# Patient Record
Sex: Female | Born: 1990 | Hispanic: No | Marital: Married | Smoking: Never smoker
Health system: Southern US, Community
[De-identification: ages and names within clinical notes are randomized; demographics above are authoritative.]

## PROBLEM LIST (undated history)

## (undated) DIAGNOSIS — J309 Allergic rhinitis, unspecified: Secondary | ICD-10-CM

## (undated) DIAGNOSIS — N809 Endometriosis, unspecified: Secondary | ICD-10-CM

## (undated) DIAGNOSIS — E041 Nontoxic single thyroid nodule: Secondary | ICD-10-CM

## (undated) DIAGNOSIS — G43109 Migraine with aura, not intractable, without status migrainosus: Secondary | ICD-10-CM

## (undated) DIAGNOSIS — Z8759 Personal history of other complications of pregnancy, childbirth and the puerperium: Secondary | ICD-10-CM

## (undated) DIAGNOSIS — O4400 Placenta previa specified as without hemorrhage, unspecified trimester: Secondary | ICD-10-CM

## (undated) DIAGNOSIS — T7840XA Allergy, unspecified, initial encounter: Secondary | ICD-10-CM

## (undated) DIAGNOSIS — J45909 Unspecified asthma, uncomplicated: Secondary | ICD-10-CM

## (undated) DIAGNOSIS — Z8709 Personal history of other diseases of the respiratory system: Secondary | ICD-10-CM

## (undated) HISTORY — PX: WISDOM TOOTH EXTRACTION: SHX21

## (undated) HISTORY — DX: Allergic rhinitis, unspecified: J30.9

## (undated) HISTORY — DX: Nontoxic single thyroid nodule: E04.1

## (undated) HISTORY — PX: FOOT SURGERY: SHX648

## (undated) HISTORY — DX: Migraine with aura, not intractable, without status migrainosus: G43.109

## (undated) HISTORY — DX: Personal history of other complications of pregnancy, childbirth and the puerperium: Z87.59

## (undated) HISTORY — DX: Allergy, unspecified, initial encounter: T78.40XA

## (undated) HISTORY — DX: Unspecified asthma, uncomplicated: J45.909

---

## 2013-03-28 HISTORY — PX: WISDOM TOOTH EXTRACTION: SHX21

## 2013-04-08 ENCOUNTER — Ambulatory Visit (INDEPENDENT_AMBULATORY_CARE_PROVIDER_SITE_OTHER): Payer: Federal, State, Local not specified - PPO | Admitting: Internal Medicine

## 2013-04-08 VITALS — BP 102/64 | HR 102 | Temp 98.7°F | Resp 16 | Ht 63.0 in | Wt 128.0 lb

## 2013-04-08 DIAGNOSIS — J039 Acute tonsillitis, unspecified: Secondary | ICD-10-CM

## 2013-04-08 LAB — POCT CBC
Granulocyte percent: 83.9 %G — AB (ref 37–80)
HCT, POC: 40.4 % (ref 37.7–47.9)
Hemoglobin: 12.9 g/dL (ref 12.2–16.2)
Lymph, poc: 2.1 (ref 0.6–3.4)
MCH, POC: 30.9 pg (ref 27–31.2)
MCHC: 31.9 g/dL (ref 31.8–35.4)
MCV: 96.7 fL (ref 80–97)
MID (cbc): 1 — AB (ref 0–0.9)
MPV: 7 fL (ref 0–99.8)
POC Granulocyte: 16.2 — AB (ref 2–6.9)
POC LYMPH PERCENT: 11 %L (ref 10–50)
POC MID %: 5.1 %M (ref 0–12)
Platelet Count, POC: 312 10*3/uL (ref 142–424)
RBC: 4.18 M/uL (ref 4.04–5.48)
RDW, POC: 12.5 %
WBC: 19.3 10*3/uL — AB (ref 4.6–10.2)

## 2013-04-08 LAB — POCT RAPID STREP A (OFFICE): Rapid Strep A Screen: NEGATIVE

## 2013-04-08 MED ORDER — HYDROCODONE-ACETAMINOPHEN 5-325 MG PO TABS: 1.0000 | ORAL_TABLET | Freq: Four times a day (QID) | ORAL | Status: DC | PRN

## 2013-04-08 MED ORDER — AMOXICILLIN 500 MG PO CAPS: 1000.0000 mg | ORAL_CAPSULE | Freq: Two times a day (BID) | ORAL | Status: AC

## 2013-04-08 NOTE — Progress Notes (Signed)
  Subjective:    Patient ID: Jacqueline [name], female    DOB: [birth year], 23 y.o.   MRN: [id number]  HPI pain and swelling in the right tonsil starting last night Kept her awake No nasal congestion or cough and No fever and in no recent illness other than wisdom tooth extraction x4  2 weeks ago /followed amoxicillin ending 4 days ago   Hostess bravo  Review of Systems     Objective:   Physical Exam TMs clear Nares clear Oropharynx with 3+ right tonsil that is hemorrhagic and exudative without obvious ulcers 3+ right ac node      Results for orders placed in visit on 04/08/13  POCT RAPID STREP A (OFFICE)      Result Value Range   Rapid Strep A Screen Negative  Negative  POCT CBC      Result Value Range   WBC 19.3 (*) 4.6 - 10.2 K/uL   Lymph, poc 2.1  0.6 - 3.4   POC LYMPH PERCENT 11.0  10 - 50 %L   MID (cbc) 1.0 (*) 0 - 0.9   POC MID % 5.1  0 - 12 %M   POC Granulocyte 16.2 (*) 2 - 6.9   Granulocyte percent 83.9 (*) 37 - 80 %G   RBC 4.18  4.04 - 5.48 M/uL   Hemoglobin 12.9  12.2 - 16.2 g/dL   HCT, POC 40.9  81.1 - 47.9 %   MCV 96.7  80 - 97 fL   MCH, POC 30.9  27 - 31.2 pg   MCHC 31.9  31.8 - 35.4 g/dL   RDW, POC 91.4     Platelet Count, POC 312  142 - 424 K/uL   MPV 7.0  0 - 99.8 fL    Assessment & Plan:  Acute right-sided tonsillitis  Meds ordered this encounter  Medications  . amoxicillin (AMOXIL) 500 MG capsule    Sig: Take 2 capsules (1,000 mg total) by mouth 2 (two) times daily.    Dispense:  40 capsule    Refill:  0  . HYDROcodone-acetaminophen (NORCO) 5-325 MG per tablet    Sig: Take 1 tablet by mouth every 6 (six) hours as needed for pain.    Dispense:  20 tablet    Refill:  0   Followup 48 hours-beach sunday Throat culture sent

## 2013-04-10 ENCOUNTER — Telehealth: Payer: Self-pay

## 2013-04-10 LAB — CULTURE, GROUP A STREP: Organism ID, Bacteria: NORMAL

## 2013-04-10 NOTE — Telephone Encounter (Signed)
Patient calling about lab results. [phone]

## 2013-04-10 NOTE — Telephone Encounter (Signed)
Culture negative. Pt advised

## 2013-04-16 ENCOUNTER — Telehealth: Payer: Self-pay

## 2013-04-16 NOTE — Telephone Encounter (Signed)
PT STATES WE HAD CALLED REGARDING HER LABS. PLEASE CALL [phone]

## 2013-04-16 NOTE — Telephone Encounter (Signed)
Called pt, message in lab pool.

## 2016-02-15 DIAGNOSIS — D2372 Other benign neoplasm of skin of left lower limb, including hip: Secondary | ICD-10-CM | POA: Diagnosis not present

## 2016-02-15 DIAGNOSIS — D2371 Other benign neoplasm of skin of right lower limb, including hip: Secondary | ICD-10-CM | POA: Diagnosis not present

## 2016-02-15 DIAGNOSIS — G5762 Lesion of plantar nerve, left lower limb: Secondary | ICD-10-CM | POA: Diagnosis not present

## 2016-04-26 ENCOUNTER — Ambulatory Visit (INDEPENDENT_AMBULATORY_CARE_PROVIDER_SITE_OTHER): Payer: BLUE CROSS/BLUE SHIELD | Admitting: Certified Nurse Midwife

## 2016-04-26 ENCOUNTER — Encounter: Payer: Self-pay | Admitting: Certified Nurse Midwife

## 2016-04-26 VITALS — BP 90/60 | HR 68 | Resp 16 | Ht 62.25 in | Wt 123.0 lb

## 2016-04-26 DIAGNOSIS — Z0000 Encounter for general adult medical examination without abnormal findings: Secondary | ICD-10-CM

## 2016-04-26 DIAGNOSIS — E049 Nontoxic goiter, unspecified: Secondary | ICD-10-CM

## 2016-04-26 DIAGNOSIS — Z01419 Encounter for gynecological examination (general) (routine) without abnormal findings: Secondary | ICD-10-CM

## 2016-04-26 DIAGNOSIS — Z124 Encounter for screening for malignant neoplasm of cervix: Principal | ICD-10-CM

## 2016-04-26 LAB — THYROID PANEL WITH TSH
Free Thyroxine Index: 2.6 (ref 1.4–3.8)
T3 Uptake: 30 % (ref 22–35)
T4, Total: 8.7 ug/dL (ref 4.5–12.0)
TSH: 1.39 mIU/L

## 2016-04-26 LAB — CBC
HCT: 41.7 % (ref 35.0–45.0)
Hemoglobin: 14.1 g/dL (ref 11.7–15.5)
MCH: 31.1 pg (ref 27.0–33.0)
MCHC: 33.8 g/dL (ref 32.0–36.0)
MCV: 91.9 fL (ref 80.0–100.0)
MPV: 8.2 fL (ref 7.5–12.5)
Platelets: 292 10*3/uL (ref 140–400)
RBC: 4.54 MIL/uL (ref 3.80–5.10)
RDW: 13 % (ref 11.0–15.0)
WBC: 10.5 10*3/uL (ref 3.8–10.8)

## 2016-04-26 NOTE — Progress Notes (Signed)
Reviewed personally.  M. Suzanne Jourdyn Hasler, MD.  

## 2016-04-26 NOTE — Patient Instructions (Signed)
General topics  Next pap or exam is  due in 1 year Take a Women's multivitamin Take 1200 mg. of calcium daily - prefer dietary If any concerns in interim to call back  Breast Self-Awareness Practicing breast self-awareness may pick up problems early, prevent significant medical complications, and possibly save your life. By practicing breast self-awareness, you can become familiar with how your breasts look and feel and if your breasts are changing. This allows you to notice changes early. It can also offer you some reassurance that your breast health is good. One way to learn what is normal for your breasts and whether your breasts are changing is to do a breast self-exam. If you find a lump or something that was not present in the past, it is best to contact your caregiver right away. Other findings that should be evaluated by your caregiver include nipple discharge, especially if it is bloody; skin changes or reddening; areas where the skin seems to be pulled in (retracted); or new lumps and bumps. Breast pain is seldom associated with cancer (malignancy), but should also be evaluated by a caregiver. BREAST SELF-EXAM The best time to examine your breasts is 5 7 days after your menstrual period is over.  ExitCare Patient Information 2013 [employer].   Exercise to Stay Healthy Exercise helps you become and stay healthy. EXERCISE IDEAS AND TIPS Choose exercises that:  You enjoy.  Fit into your day. You do not need to exercise really hard to be healthy. You can do exercises at a slow or medium level and stay healthy. You can:  Stretch before and after working out.  Try yoga, Pilates, or tai chi.  Lift weights.  Walk fast, swim, jog, run, climb stairs, bicycle, dance, or rollerskate.  Take aerobic classes. Exercises that burn about 150 calories:  Running 1  miles in 15 minutes.  Playing volleyball for 45 to 60 minutes.  Washing and waxing a car for 45 to 60  minutes.  Playing touch football for 45 minutes.  Walking 1  miles in 35 minutes.  Pushing a stroller 1  miles in 30 minutes.  Playing basketball for 30 minutes.  Raking leaves for 30 minutes.  Bicycling 5 miles in 30 minutes.  Walking 2 miles in 30 minutes.  Dancing for 30 minutes.  Shoveling snow for 15 minutes.  Swimming laps for 20 minutes.  Walking up stairs for 15 minutes.  Bicycling 4 miles in 15 minutes.  Gardening for 30 to 45 minutes.  Jumping rope for 15 minutes.  Washing windows or floors for 45 to 60 minutes. Document Released: 11/25/2010 Document Revised: 01/15/2012 Document Reviewed: 11/25/2010 ExitCare Patient Information 2013 [employer].   Other topics ( that may be useful information):    Sexually Transmitted Disease Sexually transmitted disease (STD) refers to any infection that is passed from person to person during sexual activity. This may happen by way of saliva, semen, blood, vaginal mucus, or urine. Common STDs include:  Gonorrhea.  Chlamydia.  Syphilis.  HIV/AIDS.  Genital herpes.  Hepatitis B and C.  Trichomonas.  Human papillomavirus (HPV).  Pubic lice. CAUSES  An STD may be spread by bacteria, virus, or parasite. A person can get an STD by:  Sexual intercourse with an infected person.  Sharing sex toys with an infected person.  Sharing needles with an infected person.  Having intimate contact with the genitals, mouth, or rectal areas of an infected person. SYMPTOMS  Some people may not have any symptoms, but   they can still pass the infection to others. Different STDs have different symptoms. Symptoms include:  Painful or bloody urination.  Pain in the pelvis, abdomen, vagina, anus, throat, or eyes.  Skin rash, itching, irritation, growths, or sores (lesions). These usually occur in the genital or anal area.  Abnormal vaginal discharge.  Penile discharge in men.  Soft, flesh-colored skin growths in the  genital or anal area.  Fever.  Pain or bleeding during sexual intercourse.  Swollen glands in the groin area.  Yellow skin and eyes (jaundice). This is seen with hepatitis. DIAGNOSIS  To make a diagnosis, your caregiver may:  Take a medical history.  Perform a physical exam.  Take a specimen (culture) to be examined.  Examine a sample of discharge under a microscope.  Perform blood test TREATMENT   Chlamydia, gonorrhea, trichomonas, and syphilis can be cured with antibiotic medicine.  Genital herpes, hepatitis, and HIV can be treated, but not cured, with prescribed medicines. The medicines will lessen the symptoms.  Genital warts from HPV can be treated with medicine or by freezing, burning (electrocautery), or surgery. Warts may come back.  HPV is a virus and cannot be cured with medicine or surgery.However, abnormal areas may be followed very closely by your caregiver and may be removed from the cervix, vagina, or vulva through office procedures or surgery. If your diagnosis is confirmed, your recent sexual partners need treatment. This is true even if they are symptom-free or have a negative culture or evaluation. They should not have sex until their caregiver says it is okay. HOME CARE INSTRUCTIONS  All sexual partners should be informed, tested, and treated for all STDs.  Take your antibiotics as directed. Finish them even if you start to feel better.  Only take over-the-counter or prescription medicines for pain, discomfort, or fever as directed by your caregiver.  Rest.  Eat a balanced diet and drink enough fluids to keep your urine clear or pale yellow.  Do not have sex until treatment is completed and you have followed up with your caregiver. STDs should be checked after treatment.  Keep all follow-up appointments, Pap tests, and blood tests as directed by your caregiver.  Only use latex condoms and water-soluble lubricants during sexual activity. Do not use  petroleum jelly or oils.  Avoid alcohol and illegal drugs.  Get vaccinated for HPV and hepatitis. If you have not received these vaccines in the past, talk to your caregiver about whether one or both might be right for you.  Avoid risky sex practices that can break the skin. The only way to avoid getting an STD is to avoid all sexual activity.Latex condoms and dental dams (for oral sex) will help lessen the risk of getting an STD, but will not completely eliminate the risk. SEEK MEDICAL CARE IF:   You have a fever.  You have any new or worsening symptoms. Document Released: 01/13/2003 Document Revised: 01/15/2012 Document Reviewed: 01/20/2011 [facility] Patient Information 2013 [employer].    Domestic Abuse You are being battered or abused if someone close to you hits, pushes, or physically hurts you in any way. You also are being abused if you are forced into activities. You are being sexually abused if you are forced to have sexual contact of any kind. You are being emotionally abused if you are made to feel worthless or if you are constantly threatened. It is important to remember that help is available. No one has the right to abuse you. PREVENTION OF FURTHER  ABUSE  Learn the warning signs of danger. This varies with situations but may include: the use of alcohol, threats, isolation from friends and family, or forced sexual contact. Leave if you feel that violence is going to occur.  If you are attacked or beaten, report it to the police so the abuse is documented. You do not have to press charges. The police can protect you while you or the attackers are leaving. Get the officer's name and badge number and a copy of the report.  Find someone you can trust and tell them what is happening to you: your caregiver, a nurse, clergy member, close friend or family member. Feeling ashamed is natural, but remember that you have done nothing wrong. No one deserves abuse. Document Released:  10/20/2000 Document Revised: 01/15/2012 Document Reviewed: 12/29/2010 ExitCare Patient Information 2013 [employer].    How Much is Too Much Alcohol? Drinking too much alcohol can cause injury, accidents, and health problems. These types of problems can include:   Car crashes.  Falls.  Family fighting (domestic violence).  Drowning.  Fights.  Injuries.  Burns.  Damage to certain organs.  Having a baby with birth defects. ONE DRINK CAN BE TOO MUCH WHEN YOU ARE:  Working.  Pregnant or breastfeeding.  Taking medicines. Ask your doctor.  Driving or planning to drive. If you or someone you know has a drinking problem, get help from a doctor.  Document Released: 08/19/2009 Document Revised: 01/15/2012 Document Reviewed: 08/19/2009 ExitCare Patient Information 2013 [employer].   Smoking Hazards Smoking cigarettes is extremely bad for your health. Tobacco smoke has over 200 known poisons in it. There are over 60 chemicals in tobacco smoke that cause cancer. Some of the chemicals found in cigarette smoke include:   Cyanide.  Benzene.  Formaldehyde.  Methanol (wood alcohol).  Acetylene (fuel used in welding torches).  Ammonia. Cigarette smoke also contains the poisonous gases nitrogen oxide and carbon monoxide.  Cigarette smokers have an increased risk of many serious medical problems and Smoking causes approximately:  90% of all lung cancer deaths in men.  80% of all lung cancer deaths in women.  90% of deaths from chronic obstructive lung disease. Compared with nonsmokers, smoking increases the risk of:  Coronary heart disease by 2 to 4 times.  Stroke by 2 to 4 times.  Men developing lung cancer by 23 times.  Women developing lung cancer by 13 times.  Dying from chronic obstructive lung diseases by 12 times.  . Smoking is the most preventable cause of death and disease in our society.  WHY IS SMOKING ADDICTIVE?  Nicotine is the chemical  agent in tobacco that is capable of causing addiction or dependence.  When you smoke and inhale, nicotine is absorbed rapidly into the bloodstream through your lungs. Nicotine absorbed through the lungs is capable of creating a powerful addiction. Both inhaled and non-inhaled nicotine may be addictive.  Addiction studies of cigarettes and spit tobacco show that addiction to nicotine occurs mainly during the teen years, when young people begin using tobacco products. WHAT ARE THE BENEFITS OF QUITTING?  There are many health benefits to quitting smoking.   Likelihood of developing cancer and heart disease decreases. Health improvements are seen almost immediately.  Blood pressure, pulse rate, and breathing patterns start returning to normal soon after quitting. QUITTING SMOKING   American Lung Association - 1-800-LUNGUSA  American Cancer Society - 1-800-ACS-2345 Document Released: 11/30/2004 Document Revised: 01/15/2012 Document Reviewed: 08/04/2009 ExitCare Patient Information 2013 ExitCare,   LLC.   Stress Management Stress is a state of physical or mental tension that often results from changes in your life or normal routine. Some common causes of stress are:  Death of a loved one.  Injuries or severe illnesses.  Getting fired or changing jobs.  Moving into a new home. Other causes may be:  Sexual problems.  Business or financial losses.  Taking on a large debt.  Regular conflict with someone at home or at work.  Constant tiredness from lack of sleep. It is not just bad things that are stressful. It may be stressful to:  Win the lottery.  Get married.  Buy a new car. The amount of stress that can be easily tolerated varies from person to person. Changes generally cause stress, regardless of the types of change. Too much stress can affect your health. It may lead to physical or emotional problems. Too little stress (boredom) may also become stressful. SUGGESTIONS TO  REDUCE STRESS:  Talk things over with your family and friends. It often is helpful to share your concerns and worries. If you feel your problem is serious, you may want to get help from a professional counselor.  Consider your problems one at a time instead of lumping them all together. Trying to take care of everything at once may seem impossible. List all the things you need to do and then start with the most important one. Set a goal to accomplish 2 or 3 things each day. If you expect to do too many in a single day you will naturally fail, causing you to feel even more stressed.  Do not use alcohol or drugs to relieve stress. Although you may feel better for a short time, they do not remove the problems that caused the stress. They can also be habit forming.  Exercise regularly - at least 3 times per week. Physical exercise can help to relieve that "uptight" feeling and will relax you.  The shortest distance between despair and hope is often a good night's sleep.  Go to bed and get up on time allowing yourself time for appointments without being rushed.  Take a short "time-out" period from any stressful situation that occurs during the day. Close your eyes and take some deep breaths. Starting with the muscles in your face, tense them, hold it for a few seconds, then relax. Repeat this with the muscles in your neck, shoulders, hand, stomach, back and legs.  Take good care of yourself. Eat a balanced diet and get plenty of rest.  Schedule time for having fun. Take a break from your daily routine to relax. HOME CARE INSTRUCTIONS   Call if you feel overwhelmed by your problems and feel you can no longer manage them on your own.  Return immediately if you feel like hurting yourself or someone else. Document Released: 04/18/2001 Document Revised: 01/15/2012 Document Reviewed: 12/09/2007 ExitCare Patient Information 2013 ExitCare, [employer].   

## 2016-04-26 NOTE — Progress Notes (Signed)
25 y.o. G0P0000 Married  Caucasian Fe here to establish gyn care and  for annual exam.Contraception previously used Nuvaring and stopped about 1 1/2 years ago. Has not been trying for pregnancy, but would be welcome. Desires screening needed for pregnancy if needed. Periods 35 day cycle and duration 4 days, day one heavy, occasional cramping day one only and some PMS. Has noted spotting midcycle and can having spotting up to 2 days to two weeks with period.Has done home UPT which has been negative. Negative UPT today.  Sees PCP prn. No other health issues today. Planning pregnancy in next year.  Patient's last menstrual period was 04/06/2016.          Sexually active: Yes.    The current method of family planning is none.    Exercising: Yes.    occ weight lifting Smoker:  no  Health Maintenance: Pap:  2016 neg, no abnormal pap smears in past MMG:  none Colonoscopy:  none BMD:   none TDaP: pt to check with previous gyn Shingles: no Pneumonia: no Hep C and HIV: not done Labs: none Self breast exam: not done   reports that she has never smoked. She does not have any smokeless tobacco history on file. She reports that she drinks about 0.6 - 1.8 oz of alcohol per week. She reports that she does not use illicit drugs.  History reviewed. No pertinent past medical history.  History reviewed. No pertinent past surgical history.  Current Outpatient Prescriptions  Medication Sig Dispense Refill  . loratadine (CLARITIN) 10 MG tablet Take 10 mg by mouth as needed for allergies.    . Multiple Vitamins-Minerals (MULTIVITAMIN PO) Take by mouth daily.    . Pyridoxine HCl (B-6 PO) Take by mouth daily.     No current facility-administered medications for this visit.    History reviewed. No pertinent family history.  ROS:  Pertinent items are noted in HPI.  Otherwise, a comprehensive ROS was negative.  Exam:   BP 90/60 mmHg  Pulse 68  Resp 16  Ht 5' 2.25" (1.581 m)  Wt 123 lb (55.792 kg)   BMI 22.32 kg/m2  LMP 04/06/2016 Height: 5' 2.25" (158.1 cm) Ht Readings from Last 3 Encounters:  04/26/16 5' 2.25" (1.581 m)    General appearance: alert, cooperative and appears stated age Head: Normocephalic, without obvious abnormality, atraumatic Neck: no adenopathy, supple, symmetrical, trachea midline and thyroid enlarged Lungs: clear to auscultation bilaterally Breasts: normal appearance, no masses or tenderness, No nipple retraction or dimpling, No nipple discharge or bleeding, No axillary or supraclavicular adenopathy Heart: regular rate and rhythm Abdomen: soft, non-tender; no masses,  no organomegaly Extremities: extremities normal, atraumatic, no cyanosis or edema Skin: Skin color, texture, turgor normal. No rashes or lesions Lymph nodes: Cervical, supraclavicular, and axillary nodes normal. No abnormal inguinal nodes palpated Neurologic: Grossly normal   Pelvic: External genitalia:  no lesions              Urethra:  normal appearing urethra with no masses, tenderness or lesions              Bartholin's and Skene's: normal                 Vagina: normal appearing vagina with normal color and discharge, no lesions              Cervix: no cervical motion tenderness, no lesions and nulliparous appearance  Pap taken: Yes.   Bimanual Exam:  Uterus:  normal size, contour, position, consistency, mobility, non-tender and anteverted              Adnexa: normal adnexa and no mass, fullness, tenderness               Rectovaginal: Confirms               Anus:  Normal appearance  Chaperone present: yes  A:  Well Woman with normal exam  Contraception NFP  DUB at times with normal cycle  Enlarged thyroid  Screening labs  May plan pregnancy in next year  P:   Reviewed health and wellness pertinent to exam  Discussed starting on OTC prenatal vitamins which increases folic acid content which is helpful with pregnancy. Given information on what to buy OTC. Given planning  for pregnancy information and discussed Rubella screen and would like to have done. Patient will continue to keep menses calendar with spotting.   Discussed enlarged thyroid and need to evaluate with lab and [place]. Also discussed can affect menses cycle. Agreeable to labs and plan. Patient will be called with appointment for [place] of thyroid. Questions addressed regarding enlargement etiology.  Labs: Rubella, CBC, Thyroid panel with TSH  Pap smear as above with HPV reflex   counseled on breast self exam, adequate intake of calcium and vitamin D, diet and exercise  return annually or prn  An After Visit Summary was printed and given to the patient.

## 2016-04-27 ENCOUNTER — Telehealth: Payer: Self-pay

## 2016-04-27 LAB — RUBELLA SCREEN: Rubella: 2 Index — ABNORMAL HIGH (ref <0.90)

## 2016-04-27 LAB — IPS PAP TEST WITH REFLEX TO HPV

## 2016-04-27 NOTE — Telephone Encounter (Signed)
lmtcb

## 2016-04-27 NOTE — Telephone Encounter (Signed)
Patient notified of results. See lab 

## 2016-04-27 NOTE — Telephone Encounter (Signed)
-----   Message from Regina Eck, CNM sent at 04/27/2016  1:08 PM EDT ----- Notify patient that Rubella screen shows immunity, so no concerns CBC is normal Thyroid panel is normal, but would like for patient to thyroid US due to enlargement. Order placed. She will be called with appointment.

## 2016-05-02 ENCOUNTER — Ambulatory Visit
Admission: RE | Admit: 2016-05-02 | Discharge: 2016-05-02 | Disposition: A | Discharge status: Home / Self Care | Payer: BLUE CROSS/BLUE SHIELD | Source: Ambulatory Visit | Attending: Certified Nurse Midwife | Admitting: Certified Nurse Midwife

## 2016-05-02 DIAGNOSIS — E049 Nontoxic goiter, unspecified: Principal | ICD-10-CM

## 2016-05-04 ENCOUNTER — Telehealth: Payer: Self-pay | Admitting: Certified Nurse Midwife

## 2016-05-04 DIAGNOSIS — E049 Nontoxic goiter, unspecified: Principal | ICD-10-CM

## 2016-05-04 NOTE — Telephone Encounter (Signed)
Agree with plan 

## 2016-05-04 NOTE — Telephone Encounter (Signed)
Patient calling for results on thyroid scan.   Routing to triage.

## 2016-05-04 NOTE — Telephone Encounter (Signed)
Spoke with patient. Advised of message as seen below from Flora. Patient is agreeable and verbalizes understanding. Declines to schedule 6 month follow up ultrasound at this time. She would like to wait until this gets closer. Will place in imaging hold. Asking if she needs any additional testing regarding her irregular spotting. "I know the reason she wanted to check my thyroid was due to the spotting. I didn't know if I need to do anything else now." Advised I will speak with Melvia Heaps CNM and return call with further recommendations.  Notes Recorded by Regina Eck, CNM on 05/03/2016 at 12:40 PM Notify patient that US showed slight enlargement with a slight subtle hypoechoic area on posterior aspect of right thyroid on right lobe. Left lobe normal with no nodule. Recommend repeat US in 6 months. Please schedule.  Cc: Karen Chafe, RN for imaging hold

## 2016-05-05 NOTE — Addendum Note (Signed)
Addended by: Michele Mcalpine on: 05/05/2016 01:56 PM   Modules accepted: Orders

## 2016-05-12 ENCOUNTER — Telehealth: Payer: Self-pay | Admitting: Emergency Medicine

## 2016-05-12 DIAGNOSIS — R9389 Abnormal findings on diagnostic imaging of other specified body structures: Principal | ICD-10-CM

## 2016-05-12 NOTE — Telephone Encounter (Signed)
Spoke with patient and advised that referral recommended by Dr. Sabra Heck. She is agreeable and advised that referral placed to Dr. Loanne Drilling at [facility].   Advised she will be contacted regarding appointment.  cc Kerry Hough for processing and patient contact.  Routing to provider for final review. Patient agreeable to disposition. Will close encounter.

## 2016-05-12 NOTE — Telephone Encounter (Signed)
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  CC:  Debbi as FYI.  Thanks.  Vinnie Level ----- Message -----    From: Michele Mcalpine, RN    Sent: 05/05/2016   1:38 PM      To: Megan Salon, MD Subject: Thyroid imaging                                Dr. Sabra Heck, this is a [facility] patient who went for ultrasound of thyroid.  They want to rescan in 6 months. Do I need to enter in a recall or just continue  hold?  Is endocrine referral necessary at this time?

## 2016-05-24 DIAGNOSIS — D2372 Other benign neoplasm of skin of left lower limb, including hip: Secondary | ICD-10-CM | POA: Diagnosis not present

## 2016-05-24 DIAGNOSIS — M21622 Bunionette of left foot: Secondary | ICD-10-CM | POA: Diagnosis not present

## 2016-05-24 DIAGNOSIS — G5762 Lesion of plantar nerve, left lower limb: Secondary | ICD-10-CM | POA: Diagnosis not present

## 2016-05-26 ENCOUNTER — Ambulatory Visit (INDEPENDENT_AMBULATORY_CARE_PROVIDER_SITE_OTHER): Payer: BLUE CROSS/BLUE SHIELD | Admitting: Endocrinology

## 2016-05-26 ENCOUNTER — Encounter: Payer: Self-pay | Admitting: Endocrinology

## 2016-05-26 VITALS — BP 96/68 | HR 68 | Temp 98.9°F | Ht 62.5 in | Wt 123.5 lb

## 2016-05-26 DIAGNOSIS — J309 Allergic rhinitis, unspecified: Secondary | ICD-10-CM

## 2016-05-26 DIAGNOSIS — R221 Localized swelling, mass and lump, neck: Principal | ICD-10-CM

## 2016-05-26 DIAGNOSIS — E041 Nontoxic single thyroid nodule: Secondary | ICD-10-CM

## 2016-05-26 NOTE — Patient Instructions (Signed)
Please recheck the ultrasound in 6-12 months.   You can call us when you are ready to do, or if not, we'll call you in 1 year.   most of the time, a "lumpy thyroid" will eventually become overactive (underactive is less likely, but also possible).  this is usually a slow process, happening over the span of many years.

## 2016-05-26 NOTE — Progress Notes (Signed)
Subjective:    Patient ID: Jacqueline [name], female    DOB: Oct 12, 1991, 25 y.o.   MRN: [id number]  HPI 1 month ago, pt was noted to have a nodule at the right side of the thyroid.  No assoc pain.  she is unaware of ever having had thyroid problems in the past.  she has no h/o XRT or surgery to the neck.   Past Medical History:  Diagnosis Date  . Allergic rhinitis   . Thyroid nodule     No past surgical history on file.  Social History   Social History  . Marital status: Married    Spouse name: N/A  . Number of children: N/A  . Years of education: N/A   Occupational History  . Not on file.   Social History Main Topics  . Smoking status: Never Smoker  . Smokeless tobacco: Never Used  . Alcohol use 0.6 - 1.8 oz/week    1 - 3 Standard drinks or equivalent per week     Comment: occasional  . Drug use: No  . Sexual activity: Yes    Partners: Male    Birth control/ protection: None   Other Topics Concern  . Not on file   Social History Narrative  . No narrative on file    Current Outpatient Prescriptions on File Prior to Visit  Medication Sig Dispense Refill  . loratadine (CLARITIN) 10 MG tablet Take 10 mg by mouth as needed for allergies.    . Multiple Vitamins-Minerals (MULTIVITAMIN PO) Take by mouth daily.     No current facility-administered medications on file prior to visit.     Allergies  Allergen Reactions  . Amoxicillin-Pot Clavulanate Other (See Comments)    Does not remember    Family History  Problem Relation Age of Onset  . Breast cancer Mother 43     mastectomy  . Thyroid disease Neg Hx     BP 96/68   Pulse 68   Temp 98.9 F (37.2 C) (Oral)   Ht 5' 2.5" (1.588 m)   Wt 123 lb 8 oz (56 kg)   LMP 05/16/2016   SpO2 98%   BMI 22.23 kg/m    Review of Systems Denies weight change, hoarseness, visual loss, chest pain, sob, dysphagia, skin rash, easy bruising, depression, cold intolerance, headache, numbness, and rhinorrhea.       Objective:     Physical Exam VS: see vs page GEN: no distress HEAD: head: no deformity eyes: no periorbital swelling, no proptosis external nose and ears are normal mouth: no lesion seen NECK: slight fullness at the right thyroid area CHEST WALL: no deformity LUNGS: clear to auscultation CV: reg rate and rhythm, no murmur ABD: abdomen is soft, nontender.  no hepatosplenomegaly.  not distended.  no hernia MUSCULOSKELETAL: muscle bulk and strength are grossly normal.  no obvious joint swelling.  gait is normal and steady EXTEMITIES: no deformity.  no ulcer on the feet.  feet are of normal color and temp.  no edema PULSES: dorsalis pedis intact bilat.  no carotid bruit NEURO:  cn 2-12 grossly intact.   readily moves all 4's.  sensation is intact to touch on the feet SKIN:  Normal texture and temperature.  No rash or suspicious lesion is visible.   NODES:  None palpable at the neck PSYCH: alert, well-oriented.  Does not appear anxious nor depressed.   Lab Results  Component Value Date   TSH 1.39 04/26/2016   T4TOTAL 8.7 04/26/2016  [place]: Indeterminate 1.5 cm hypoechoic area along the posterior right thyroid lobe  I have reviewed outside records, and summarized: Pt was recently seen for well woman exam, and was incidentally noted to have thyromegaly.  She is at risk for pregnancy.  She was referred here.     Assessment & Plan:  Thyroid nodule, new, uncertain etiology.  Too small to need bx.  Euthyroid  Patient is advised the following: Patient Instructions  Please recheck the ultrasound in 6-12 months.   You can call us when you are ready to do, or if not, we'll call you in 1 year.   most of the time, a "lumpy thyroid" will eventually become overactive (underactive is less likely, but also possible).  this is usually a slow process, happening over the span of many years.   Renato Shin, MD

## 2016-05-26 NOTE — Progress Notes (Signed)
Pre visit review using our clinic review tool, if applicable. No additional management support is needed unless otherwise documented below in the visit note. 

## 2016-05-28 ENCOUNTER — Encounter: Payer: Self-pay | Admitting: Endocrinology

## 2016-05-28 DIAGNOSIS — J309 Allergic rhinitis, unspecified: Secondary | ICD-10-CM

## 2016-05-29 ENCOUNTER — Other Ambulatory Visit: Payer: Self-pay | Admitting: Endocrinology

## 2016-05-29 DIAGNOSIS — E042 Nontoxic multinodular goiter: Principal | ICD-10-CM

## 2016-05-29 HISTORY — DX: Nontoxic multinodular goiter: E04.2

## 2016-05-29 LAB — PTH, INTACT AND CALCIUM
Calcium: 9.2 mg/dL (ref 8.4–10.5)
PTH: 33 pg/mL (ref 14–64)

## 2016-06-02 ENCOUNTER — Telehealth: Payer: Self-pay | Admitting: Certified Nurse Midwife

## 2016-06-09 ENCOUNTER — Encounter: Payer: Self-pay | Admitting: Certified Nurse Midwife

## 2016-06-09 ENCOUNTER — Ambulatory Visit (INDEPENDENT_AMBULATORY_CARE_PROVIDER_SITE_OTHER): Payer: BLUE CROSS/BLUE SHIELD | Admitting: Certified Nurse Midwife

## 2016-06-09 VITALS — BP 102/64 | HR 72 | Temp 98.2°F | Resp 16 | Ht 62.5 in | Wt 124.0 lb

## 2016-06-09 DIAGNOSIS — R109 Unspecified abdominal pain: Secondary | ICD-10-CM

## 2016-06-09 DIAGNOSIS — B373 Candidiasis of vulva and vagina: Secondary | ICD-10-CM

## 2016-06-09 DIAGNOSIS — K5900 Constipation, unspecified: Principal | ICD-10-CM

## 2016-06-09 DIAGNOSIS — B3731 Acute candidiasis of vulva and vagina: Secondary | ICD-10-CM

## 2016-06-09 LAB — POCT URINALYSIS DIPSTICK
Bilirubin, UA: NEGATIVE
Glucose, UA: NEGATIVE
Ketones, UA: NEGATIVE
Leukocytes, UA: NEGATIVE
Nitrite, UA: NEGATIVE
Protein, UA: NEGATIVE
Urobilinogen, UA: NEGATIVE
pH, UA: 5

## 2016-06-09 LAB — POCT URINE PREGNANCY: Preg Test, Ur: NEGATIVE

## 2016-06-09 NOTE — Patient Instructions (Signed)

## 2016-06-09 NOTE — Progress Notes (Signed)
25 y.o. Married Caucasian female G0P0000 here complaining of spotting with brown discharge and some red slight tiny clots noted. Last period was July 11 and was normal. Has 35 to 40 day cycles usually, but has had shorter cycles occasionally. This would be 21 days from onset 4 days ago. Denies any cramping now or heavy bleeding. Some bloating, but has constipation and took Dulcolax late pm and Gas X several times today. No nausea, vomiting or diarrhea prior to onset or now. Just wanted to make sure no vaginal problem.No urinary frequency or urgency or pain.No back pain or headache.. No vaginal itching or increase in vaginal discharge.  . Contraception is none, previous Nuvaring use, not trying for pregnancy at this point. No other concerns.    O:Healthy female WDWN Affect: normal, orientation x 3 No distress noted  Exam:Skin: warm and dry CVAT negative bilateral Abdomen: soft, very slight tenderness mid sternal, no rebound tenderness. Normal bowel sounds all four quadrants, gas sounds noted with and without stethescope,negative suprapubic  Inguinal Lymph nodes: no enlargement or tenderness  Pelvic exam: External genital: normal female, no lesions or redness BUS: normal, non tender Bladder non tender, Urethral meatus not red or tender Vagina: very scant pink discharge noted. Ph:4.0  ,Wet prep taken Cervix: normal, non tender, no CMT, scant blood noted from cervix Uterus: normal, non tender, no masses Adnexa:normal, non tender, no masses or fullness noted  POCT Urine: RBC TR,  POCT UPT: negative Wet Prep results:KOH,Saline  1-2 yeast buds noted only    A:Normal pelvic exam ? Menses due to history of irregular cycles Constipation with medication taken Wet prep + yeast, but not symptomatic   P:Discussed findings of normal pelvic exam and due to length of spotting and scant blood could be a cycle. Instructed to keep menses calendar and advise if bleeding or spotting continues. Patient feels  has decreased to very little now.  Discussed upper abdominal discomfort likely from medication used and constipation. Increase water intake to help with constipation . Warning signs of abdominal pain given and need to be evaluated by MD or Urgent care. Questions addressed at length. Discussed yeast found, but do feel this is infection or source of bleeding. Do not feel treatment needed. Patient agrees. If after bleeding stops and if vaginal itching occurs will treat yeast. Patient agreeable to plan and feels much better after being evaluated.  Rv prn

## 2016-06-14 NOTE — Progress Notes (Signed)
Encounter reviewed. UPT negative Sumner Boast, MD

## 2016-06-28 NOTE — Telephone Encounter (Signed)
No notes

## 2016-07-12 DIAGNOSIS — G5762 Lesion of plantar nerve, left lower limb: Secondary | ICD-10-CM | POA: Diagnosis not present

## 2016-07-12 DIAGNOSIS — D2372 Other benign neoplasm of skin of left lower limb, including hip: Secondary | ICD-10-CM | POA: Diagnosis not present

## 2016-07-12 DIAGNOSIS — M21622 Bunionette of left foot: Secondary | ICD-10-CM | POA: Diagnosis not present

## 2016-07-31 DIAGNOSIS — N911 Secondary amenorrhea: Secondary | ICD-10-CM | POA: Diagnosis not present

## 2016-08-02 DIAGNOSIS — N912 Amenorrhea, unspecified: Secondary | ICD-10-CM | POA: Diagnosis not present

## 2016-08-04 DIAGNOSIS — O200 Threatened abortion: Secondary | ICD-10-CM | POA: Diagnosis not present

## 2016-08-08 DIAGNOSIS — O200 Threatened abortion: Secondary | ICD-10-CM | POA: Diagnosis not present

## 2016-08-17 DIAGNOSIS — O200 Threatened abortion: Secondary | ICD-10-CM | POA: Diagnosis not present

## 2016-09-08 ENCOUNTER — Encounter: Payer: Self-pay | Admitting: Obstetrics & Gynecology

## 2016-09-08 ENCOUNTER — Ambulatory Visit (INDEPENDENT_AMBULATORY_CARE_PROVIDER_SITE_OTHER): Payer: BLUE CROSS/BLUE SHIELD | Admitting: Obstetrics & Gynecology

## 2016-09-08 VITALS — BP 110/70 | HR 80 | Resp 14 | Ht 62.5 in | Wt 121.0 lb

## 2016-09-08 DIAGNOSIS — N83202 Unspecified ovarian cyst, left side: Secondary | ICD-10-CM

## 2016-09-08 DIAGNOSIS — N938 Other specified abnormal uterine and vaginal bleeding: Secondary | ICD-10-CM

## 2016-09-08 DIAGNOSIS — N83201 Unspecified ovarian cyst, right side: Secondary | ICD-10-CM

## 2016-09-08 DIAGNOSIS — O039 Complete or unspecified spontaneous abortion without complication: Principal | ICD-10-CM

## 2016-09-08 NOTE — Progress Notes (Signed)
25 y.o. G76P0010 Married Caucasian female here for discussion of recent miscarriage and to review ultrasound results done in [place].  Her mother works at [employer] [facility] and pt was seen by Dr. Alfonso Patten there.  Pt has been experiencing some increased irregular bleeding this year as well.  She is not on any contraception is using the withdrawal method for her contraception.    Pt reports she had a cycle starting on August the 12 which was a normal 4-5 days for her, then she spotted on August 3th for two days.  She had a positive pregnancy test but the HCG values were not increasing normally and she then had a miscarriage on October 12.  She has two ultrasounds during this time--October 3 and 12.  There is a concern on the ultrasound that she may have an endometrioma on both ovaries.  It was recommended for her to proceed with diagnostic laparoscopy.  She did bring pictures for me to review.  One of these is clearly a corpus luteal cyst.  The other may be an endometrioma.  As she was pregnant during the ultrasound evaluations, feel she needs to have another one done right after a cycle to try and not confuse the picture with any new ovulatory cysts/corpus luteal findings.    Pt really doesn't have a lot of pain.  Her biggest issue (until the recent SAB) was irregular bleeding.  She does report that she's not sure she wants to actively try for pregnancy at this time.  Husband and pt have discussed trying after three years of marriage.  They've been married one year.  Husband was "ok" with pregnancy but not "ready for it".  They are actively talking about this.  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  This was tested during her last pregnancy.  She doesn't really want to be on oral progesterone for contraception at this time.  Lastly, pt and her mother are very anxious about having  laparoscopy.  For now, as I do not have clear evidence of her having endometriomas, do not feel it is really necessary to discuss this.  We did review the surgery briefly so they have an understanding of what that would involve.  Given she has no pelvic pain, got pregnant without really "trying", and was pregnant when the ultrasounds were done there is enough question about this diagnosis that we should be really sure about it before proceeding with any surgery.  They are both comfortable with this and relieved.  Patient's last menstrual period was 06/17/2016 (approximate).          Sexually active: Yes.    The current method of family planning is coitus interruptus.    Exercising: Yes.    Smoker:  no  Health Maintenance: Pap:  04/26/16 negative History of abnormal Pap:  no MMG:  n/a   reports that she has never smoked. She has never used smokeless tobacco. She reports that she drinks about 0.6 - 1.8 oz of alcohol per week . She reports that she does not use drugs.  Past Medical History:  Diagnosis Date  . Allergic rhinitis   . Thyroid nodule     History reviewed. No pertinent surgical history.  Current Outpatient Prescriptions  Medication Sig Dispense Refill  . loratadine (CLARITIN) 10 MG tablet Take 10 mg by mouth as needed for allergies.    . Multiple Vitamins-Minerals (  MULTIVITAMIN PO) Take by mouth daily.     No current facility-administered medications for this visit.     Family History  Problem Relation Age of Onset  . Breast cancer Mother 79     mastectomy  . Thyroid disease Neg Hx     ROS:  Pertinent items are noted in HPI.  Otherwise, a comprehensive ROS was negative.  Exam:   BP 110/70 (BP Location: Right Arm, Patient Position: Sitting, Cuff Size: Normal)   Pulse 80   Resp 14   Ht 5' 2.5" (1.588 m)   Wt 121 lb (54.9 kg)   LMP 06/17/2016 (Approximate)   BMI 21.78 kg/m     Height: 5' 2.5" (158.8 cm)  Ht Readings from Last 3 Encounters:  09/08/16 5' 2.5"  (1.588 m)  06/09/16 5' 2.5" (1.588 m)  05/26/16 5' 2.5" (1.588 m)    General appearance: alert, cooperative and appears stated age Head: Normocephalic, without obvious abnormality, atraumatic Lungs: clear to auscultation bilaterally Heart: regular rate and rhythm Abdomen: soft, non-tender; bowel sounds normal; no masses,  no organomegaly Extremities: extremities normal, atraumatic, no cyanosis or edema Skin: Skin color, texture, turgor normal. No rashes or lesions Lymph nodes: Cervical, supraclavicular, and axillary nodes normal. No abnormal inguinal nodes palpated Neurologic: Grossly normal  Pelvic: No pelvic exam performed today         Chaperone was present for exam.  A:  Recent SAB Possible finding of corpus luteal cyst on ultrasound vs endometrioma DUB prior to this recent pregnancy Withdrawal method for contraception Possible luteal phase defect  P:   Pt will return after next menstrual cycle for PUS.  She knows to call with onset of cycle for this appt to be scheduled.  Will try to do at the end of the day, as this appt was done. Encouraged her to consider what she really wants in regards to pregnancy.  Other contraceptive options were reviewed.  Skyla IUD could be a good option for her at this time. Do not think additional progesterone supplementation is needed right now. Consider day 23 progesterone testing for diagnosis of luteal phase defect  ~60 minutes spent with patient and her mother with almost all of this in face to face discussion of above.

## 2016-09-19 ENCOUNTER — Telehealth: Payer: Self-pay | Admitting: Obstetrics & Gynecology

## 2016-09-19 DIAGNOSIS — N978 Female infertility of other origin: Secondary | ICD-10-CM

## 2016-09-19 DIAGNOSIS — N83201 Unspecified ovarian cyst, right side: Principal | ICD-10-CM

## 2016-09-19 DIAGNOSIS — N83202 Unspecified ovarian cyst, left side: Principal | ICD-10-CM

## 2016-09-19 NOTE — Telephone Encounter (Signed)
Orders placed: TVUS for ovarian cyst and Progesterone lab   Routing to provider for final review. Patient is agreeable to disposition. Will close encounter.

## 2016-09-19 NOTE — Telephone Encounter (Signed)
Patient called and said Dr. Sabra Heck told her to call in and schedule a lab appointment on the third of her menstrual cycle. 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  Routing to triage to be sure patient does not need to be on the provider's schedule too.

## 2016-09-19 NOTE — Telephone Encounter (Signed)
The ultrasound is to f/u on an ovarian cyst seen at another location. Please order TVUS for ovarian cyst

## 2016-09-19 NOTE — Telephone Encounter (Signed)
Spoke with patient. Patient states LMP 09/19/16 -would like to schedule progesterone testing per conversation with Dr. Sabra Heck at 09/08/16 OV. Patient scheduled for lab appt on 10/11/16 at 3:30pm for day 23 progesterone. Patient scheduled for PUS 09/26/16 at 3pm, OV to follow at 3:30pm with Dr. Sabra Heck. Patient is agreeable to date and time.   No orders placed -Dr. Talbert Nan, can you clarify diagnosis for PUS?   P:         Pt will return after next menstrual cycle for PUS.  She knows to call with onset of cycle for this appt to be scheduled.  Will try to do at the end of the day, as this appt was done. Encouraged her to consider what she really wants in regards to pregnancy.  Other contraceptive options were reviewed.  Skyla IUD could be a good option for her at this time. Do not think additional progesterone supplementation is needed right now. Consider day 23 progesterone testing for diagnosis of luteal phase defect   Cc: Dr. Sabra Heck

## 2016-09-20 ENCOUNTER — Telehealth: Payer: Self-pay | Admitting: *Deleted

## 2016-09-20 NOTE — Telephone Encounter (Signed)
Call to patient, mailbox remains full. No message left.

## 2016-09-20 NOTE — Telephone Encounter (Signed)
Reviewed with Dr Sabra Heck. Appointment scheduled discussed. Call to patient to review. Unable to leave message. Voice mail box full.

## 2016-09-20 NOTE — Telephone Encounter (Signed)
Call to patient. Advised of appointments adjustments made as requested. Patient agreeable and appreciative.     Routing to provider for final review. Patient agreeable to disposition. Will close encounter.

## 2016-09-21 ENCOUNTER — Other Ambulatory Visit: Payer: Self-pay | Admitting: *Deleted

## 2016-09-21 ENCOUNTER — Other Ambulatory Visit (INDEPENDENT_AMBULATORY_CARE_PROVIDER_SITE_OTHER): Payer: BLUE CROSS/BLUE SHIELD

## 2016-09-21 DIAGNOSIS — N938 Other specified abnormal uterine and vaginal bleeding: Principal | ICD-10-CM

## 2016-09-21 DIAGNOSIS — N978 Female infertility of other origin: Secondary | ICD-10-CM

## 2016-09-22 ENCOUNTER — Telehealth: Payer: Self-pay | Admitting: Obstetrics & Gynecology

## 2016-09-22 LAB — FOLLICLE STIMULATING HORMONE: FSH: 7.2 m[IU]/mL

## 2016-09-22 NOTE — Telephone Encounter (Signed)
Called patient to review benefits for a recommended procedure. Attempted to leave a voicemail, but patients voicemail box full.

## 2016-09-26 ENCOUNTER — Ambulatory Visit (INDEPENDENT_AMBULATORY_CARE_PROVIDER_SITE_OTHER): Payer: BLUE CROSS/BLUE SHIELD

## 2016-09-26 ENCOUNTER — Ambulatory Visit (INDEPENDENT_AMBULATORY_CARE_PROVIDER_SITE_OTHER): Payer: BLUE CROSS/BLUE SHIELD | Admitting: Obstetrics & Gynecology

## 2016-09-26 ENCOUNTER — Encounter: Payer: Self-pay | Admitting: Obstetrics & Gynecology

## 2016-09-26 DIAGNOSIS — N801 Endometriosis of ovary: Principal | ICD-10-CM

## 2016-09-26 DIAGNOSIS — N83202 Unspecified ovarian cyst, left side: Principal | ICD-10-CM

## 2016-09-26 DIAGNOSIS — N83201 Unspecified ovarian cyst, right side: Principal | ICD-10-CM

## 2016-09-26 DIAGNOSIS — N80129 Deep endometriosis of ovary, unspecified ovary: Secondary | ICD-10-CM

## 2016-09-26 NOTE — Progress Notes (Signed)
25 y.o. G79P0010 Married Caucasian female here for pelvic ultrasound to recheck ovaries for assessment of possible endometriomas.  Pt has PUS at [facility] on 08/17/16.  This was around the time she was experiencing a miscarriage.  Ultrasound showed right ovarian possible endometrium measuring 2.0 x 1.8 x 12.6cm as well as a left lesion consistent with endometrioma measuring 3.6 x 2.7 x 2.4cm.  I do not have a report, only a few pictures to review.  Advised pt to have a cycle after the miscarriage and return for PUS.  She is here for this today.  Denies pelvic pain.  Patient's last menstrual period was 09/18/2016.  Contraception: withdrawal method  Findings:  UTERUS: 6.1 x 3.6 x 3.7cm without fiborids EMS: symmetrical ADNEXA: Left ovary: 2.9 x 2.0 x 1.6cm with two areas that appear to be endometriomas measuring 3.3 x 2.9 x 3.0cm and 3.2 x 2.4 x 2.8cm       Right ovary:  6.1 x 4.6 x 3.0cm with 6.0 x 4.1 x 5.6cm cyst with internal low level achoes consistent with endometrioma as well as a second cyst measuring 3.9 x 2.1 x 2.4cm with a single septation.  All avascular.  Both findings consistent with endometriomas.  Possible evidence of hydrosalpinx but difficult to clearly identify today. CUL DE SAC: no free fluid  Discussion:  Findings reviewed with pt.  Laparoscopy with removal of endometriomas and treatment of endometriosis elsewhere in the pelvis with assessment of tubal patency reviewed.  Pt may have a hydrosalpinx on the right side, which if present, would need to be removed.  As she is not having pain at this point, I feel it is ok to just watch patient.  She is not actively trying to get pregnant.  Would like to wait at least another six months to a year.  As surgery would improve fertility, would likely be better if we waited to proceed.  Also, she may just want to try on her own again as she got pregnant in September without any assistance or intervention.  Pt is comfortable  waiting and watching.  She really does not want surgery right now and she is not interested in trying for pregnancy at this time.  We discussed other treatments for endometriosis including OCPs and Depo Lupron.  As she is not have pain, she does not want to be on anything hormonal.  For now, will plan to repeat PUS in six months and see what changes are present.  At that time, she will be close to considering pregnancy and may want to proceed with surgery at that time.    Assessment:  Bilateral endometriomas  Plan:  Repeat PUS six months.  Pt knows to call with any +pregnancy test but condoms highly recommended.  Also, she knows to call if has changes in her pain level (which is none at this time.).  Pt is also going to return for day 21 progesterone level.  This is already scheduled.  Pt aware if her mother wants to talk with me, pt can let me know and I will be glad to call her.  For now, pt declines this.  ~30 minutes spent with patient >50% of time was in face to face discussion of above.

## 2016-10-02 DIAGNOSIS — N801 Endometriosis of ovary: Principal | ICD-10-CM

## 2016-10-02 DIAGNOSIS — N80129 Deep endometriosis of ovary, unspecified ovary: Secondary | ICD-10-CM | POA: Insufficient documentation

## 2016-10-06 DIAGNOSIS — G5762 Lesion of plantar nerve, left lower limb: Secondary | ICD-10-CM | POA: Diagnosis not present

## 2016-10-06 DIAGNOSIS — D2372 Other benign neoplasm of skin of left lower limb, including hip: Secondary | ICD-10-CM | POA: Diagnosis not present

## 2016-10-11 ENCOUNTER — Telehealth: Payer: Self-pay | Admitting: Obstetrics & Gynecology

## 2016-10-11 ENCOUNTER — Other Ambulatory Visit: Payer: Self-pay

## 2016-10-11 NOTE — Telephone Encounter (Signed)
Called patient to reschedule missed lab appointment. Mailbox is full and can't leave any messages.

## 2016-10-12 ENCOUNTER — Encounter: Payer: Self-pay | Admitting: *Deleted

## 2016-10-12 NOTE — Telephone Encounter (Signed)
Unable to reach letter mailed to patient.   Routing to provider for final review. Patient agreeable to disposition. Will close encounter.

## 2016-10-12 NOTE — Telephone Encounter (Signed)
Will have Raquel Sarna send a letter reminding her she missed the lab appointment.  Thanks.  Ok to close encounter.

## 2016-10-18 DIAGNOSIS — G5762 Lesion of plantar nerve, left lower limb: Secondary | ICD-10-CM | POA: Diagnosis not present

## 2016-10-18 DIAGNOSIS — D2372 Other benign neoplasm of skin of left lower limb, including hip: Secondary | ICD-10-CM | POA: Diagnosis not present

## 2016-10-25 DIAGNOSIS — Z01818 Encounter for other preprocedural examination: Secondary | ICD-10-CM | POA: Diagnosis not present

## 2016-10-31 DIAGNOSIS — G5762 Lesion of plantar nerve, left lower limb: Secondary | ICD-10-CM | POA: Diagnosis not present

## 2016-10-31 DIAGNOSIS — B070 Plantar wart: Secondary | ICD-10-CM | POA: Diagnosis not present

## 2016-10-31 DIAGNOSIS — G5792 Unspecified mononeuropathy of left lower limb: Secondary | ICD-10-CM | POA: Diagnosis not present

## 2016-10-31 DIAGNOSIS — D2372 Other benign neoplasm of skin of left lower limb, including hip: Secondary | ICD-10-CM | POA: Diagnosis not present

## 2016-10-31 MED FILL — HYDROCODON-APAP 5-325: 5-325 | 5 days supply | Qty: 30 | Fill #0

## 2016-10-31 MED FILL — PROMETHAZINE 25 MG TABLET: 25 | 5 days supply | Qty: 30 | Fill #0

## 2016-11-06 DIAGNOSIS — E041 Nontoxic single thyroid nodule: Secondary | ICD-10-CM

## 2016-11-06 HISTORY — DX: Nontoxic single thyroid nodule: E04.1

## 2017-01-01 ENCOUNTER — Telehealth: Payer: Self-pay | Admitting: Obstetrics & Gynecology

## 2017-01-01 NOTE — Telephone Encounter (Signed)
Attempted to reach patient at number provided, [phone]. There was no answer and recording states that the voicemail box is full.

## 2017-01-01 NOTE — Telephone Encounter (Signed)
Ok for NuvaRing for 6 months.  Please send in to her pharmacy. Keep appointment for follow up ultrasound with Dr. Sabra Heck for May 2018.

## 2017-01-01 NOTE — Telephone Encounter (Signed)
Patient called and said she'd like to speak with the nurse about starting birth control again.  She'd like to go back on Nuvaring and her pharmacy on file is correct.

## 2017-01-01 NOTE — Telephone Encounter (Signed)
Spoke with patient. Patient would like to restart on the Nuvaring. States she ws previously on the [place] for many years. Stopped using the [place] and was trying to get pregnant. Patient has decided she would like to wait until the summer to try for pregnancy. Would like to restart on birth control until she is ready. Has a history of endometriomas. Was last seen in office on 09/26/2016 for PUS with Dr.Miller. 6 month PUS was recommended for follow up. Advised I will review with Dr.Silva who is covering while Dr.Miller is out of the office and return call with further recommendations. Patient is agreeable.

## 2017-01-02 MED ORDER — ETONOGESTREL-ETHINYL ESTRADIOL 0.12-0.015 MG/24HR VA RING: VAGINAL_RING | VAGINAL | 1 refills | Status: DC

## 2017-01-02 NOTE — Telephone Encounter (Signed)
Attempted to reach patient at number provided [phone], there was no answer and recording states that the voicemail box is full and is not accepting new messages. Rx for Nuvaring place 1 ring vaginally and leave in place for 3 weeks, then remove for 1 week #3 1RF sent to pharmacy on file.

## 2017-01-02 NOTE — Telephone Encounter (Signed)
Spoke with patient. Advised of message as seen below from [place]. Patient is agreeable. Aware rx for Nuvaring has been sent to pharmacy on file. Advised will need to place first ring vaginally with the first day of her next menses. Patient verbalizes understanding.  Routing to covering provider for final review. Patient agreeable to disposition. Will close encounter.

## 2017-01-24 ENCOUNTER — Other Ambulatory Visit: Payer: Self-pay | Admitting: Obstetrics and Gynecology

## 2017-01-24 ENCOUNTER — Telehealth: Payer: Self-pay | Admitting: Obstetrics & Gynecology

## 2017-01-24 MED ORDER — ETONOGESTREL-ETHINYL ESTRADIOL 0.12-0.015 MG/24HR VA RING: VAGINAL_RING | VAGINAL | 0 refills | Status: DC

## 2017-01-24 NOTE — Telephone Encounter (Signed)
Spoke with patient. Patient states that she was previously using her Nuvaring continuously before she decided to try for pregnancy. Has restarted her Nuvaring as she decided to wait for pregnancy and would like to take this continuously again. Reports the pharmacy will need a new rx that states that the patient is using the prescription continuously. Advised will review with covering MD and return call. Patient is agreeable.  Dr.Silva, okay to send in rx for Nuvaring for patient to use continuously dispense 4 rings for 3 months.

## 2017-01-24 NOTE — Telephone Encounter (Signed)
Patient would like to speak with nurse about her nuvaring prescription.

## 2017-01-24 NOTE — Telephone Encounter (Signed)
Prescription sent in to pharmacy by me for the patient to place NuvaRing every 3 weeks.  Dispense:  4, RF none.

## 2017-01-25 NOTE — Telephone Encounter (Signed)
Left detailed message at number provided [phone], okay per ROI. Advised rx for Nuvaring has been sent to pharmacy on file for continuous use. Advised to return call to the office with any further questions.  Cc: Dr.Miller  Routing to covering provider for final review. Patient agreeable to disposition. Will close encounter.

## 2017-02-13 ENCOUNTER — Other Ambulatory Visit: Payer: Self-pay

## 2017-02-13 MED ORDER — ETONOGESTREL-ETHINYL ESTRADIOL 0.12-0.015 MG/24HR VA RING: VAGINAL_RING | VAGINAL | 0 refills | Status: DC

## 2017-02-13 NOTE — Telephone Encounter (Signed)
Medication refill request: NuvaRing Last AEX:  04/26/16 DL Next AEX: 03/29/17 DL Last MMG (if hormonal medication request): n/a Refill authorized: 01/24/17 #4 0R. Patient requests [facility] for further refills.

## 2017-02-14 ENCOUNTER — Other Ambulatory Visit: Payer: Self-pay | Admitting: Obstetrics & Gynecology

## 2017-02-14 MED ORDER — ETONOGESTREL-ETHINYL ESTRADIOL 0.12-0.015 MG/24HR VA RING: VAGINAL_RING | VAGINAL | 0 refills | Status: DC

## 2017-02-14 NOTE — Telephone Encounter (Signed)
Patient would like 1 nuvaring called into [facility] on [address] because she is waiting on her mail order prescription. Pharmacy number is [phone].

## 2017-02-14 NOTE — Telephone Encounter (Addendum)
Medication refill request: nuvaring  Last AEX:  04/26/16 DL Next AEX: none Last MMG (if hormonal medication request): none Refill authorized: 02/14/16 [facility]. Today please advise.

## 2017-03-13 ENCOUNTER — Telehealth: Payer: Self-pay | Admitting: Obstetrics & Gynecology

## 2017-03-13 ENCOUNTER — Emergency Department (HOSPITAL_COMMUNITY): Payer: BLUE CROSS/BLUE SHIELD

## 2017-03-13 ENCOUNTER — Telehealth: Payer: Self-pay

## 2017-03-13 ENCOUNTER — Emergency Department (HOSPITAL_COMMUNITY)
Admission: EM | Admit: 2017-03-13 | Discharge: 2017-03-13 | Disposition: A | Discharge status: Home / Self Care | Payer: BLUE CROSS/BLUE SHIELD | Attending: Emergency Medicine | Admitting: Emergency Medicine

## 2017-03-13 ENCOUNTER — Encounter (HOSPITAL_COMMUNITY): Payer: Self-pay | Admitting: Emergency Medicine

## 2017-03-13 DIAGNOSIS — R109 Unspecified abdominal pain: Secondary | ICD-10-CM

## 2017-03-13 DIAGNOSIS — R1031 Right lower quadrant pain: Principal | ICD-10-CM

## 2017-03-13 DIAGNOSIS — R1030 Lower abdominal pain, unspecified: Secondary | ICD-10-CM

## 2017-03-13 DIAGNOSIS — Z79899 Other long term (current) drug therapy: Secondary | ICD-10-CM

## 2017-03-13 DIAGNOSIS — N809 Endometriosis, unspecified: Secondary | ICD-10-CM

## 2017-03-13 DIAGNOSIS — R102 Pelvic and perineal pain: Secondary | ICD-10-CM | POA: Diagnosis not present

## 2017-03-13 DIAGNOSIS — R1032 Left lower quadrant pain: Secondary | ICD-10-CM | POA: Diagnosis not present

## 2017-03-13 HISTORY — DX: Endometriosis, unspecified: N80.9

## 2017-03-13 LAB — I-STAT CG4 LACTIC ACID, ED: Lactic Acid, Venous: 0.56 mmol/L (ref 0.5–1.9)

## 2017-03-13 LAB — CBC
HCT: 41.3 % (ref 36.0–46.0)
Hemoglobin: 13.8 g/dL (ref 12.0–15.0)
MCH: 30.7 pg (ref 26.0–34.0)
MCHC: 33.4 g/dL (ref 30.0–36.0)
MCV: 91.8 fL (ref 78.0–100.0)
Platelets: 287 10*3/uL (ref 150–400)
RBC: 4.5 MIL/uL (ref 3.87–5.11)
RDW: 12.2 % (ref 11.5–15.5)
WBC: 14.4 10*3/uL — ABNORMAL HIGH (ref 4.0–10.5)

## 2017-03-13 LAB — COMPREHENSIVE METABOLIC PANEL
ALT: 11 U/L — ABNORMAL LOW (ref 14–54)
AST: 18 U/L (ref 15–41)
Albumin: 3.8 g/dL (ref 3.5–5.0)
Alkaline Phosphatase: 46 U/L (ref 38–126)
Anion gap: 8 (ref 5–15)
BUN: 9 mg/dL (ref 6–20)
CO2: 25 mmol/L (ref 22–32)
Calcium: 9.1 mg/dL (ref 8.9–10.3)
Chloride: 105 mmol/L (ref 101–111)
Creatinine, Ser: 0.92 mg/dL (ref 0.44–1.00)
GFR calc Af Amer: >60 mL/min (ref >60)
GFR calc non Af Amer: >60 mL/min (ref >60)
Glucose, Bld: 107 mg/dL — ABNORMAL HIGH (ref 65–99)
Potassium: 3.9 mmol/L (ref 3.5–5.1)
Sodium: 138 mmol/L (ref 135–145)
Total Bilirubin: 0.6 mg/dL (ref 0.3–1.2)
Total Protein: 7.5 g/dL (ref 6.5–8.1)

## 2017-03-13 LAB — URINALYSIS, ROUTINE W REFLEX MICROSCOPIC
Bilirubin Urine: NEGATIVE
Glucose, UA: NEGATIVE mg/dL
Hgb urine dipstick: NEGATIVE
Ketones, ur: NEGATIVE mg/dL
Leukocytes, UA: NEGATIVE
Nitrite: NEGATIVE
Protein, ur: NEGATIVE mg/dL
Specific Gravity, Urine: 1.021 (ref 1.005–1.030)
pH: 5 (ref 5.0–8.0)

## 2017-03-13 LAB — LIPASE, BLOOD: Lipase: 21 U/L (ref 11–51)

## 2017-03-13 LAB — I-STAT BETA HCG BLOOD, ED (MC, WL, AP ONLY): I-stat hCG, quantitative: <5 m[IU]/mL (ref <5)

## 2017-03-13 MED ORDER — ONDANSETRON HCL 4 MG/2ML IJ SOLN
4.0000 mg | Freq: Once | INTRAMUSCULAR | Status: AC
  Administered 2017-03-13: 4 mg via INTRAVENOUS
  Filled 2017-03-13: qty 2

## 2017-03-13 MED ORDER — IOPAMIDOL (ISOVUE-300) INJECTION 61%
INTRAVENOUS | Status: AC
  Filled 2017-03-13: qty 30

## 2017-03-13 MED ORDER — SODIUM CHLORIDE 0.9 % IV BOLUS (SEPSIS)
1000.0000 mL | Freq: Once | INTRAVENOUS | Status: AC
  Administered 2017-03-13: 1000 mL via INTRAVENOUS

## 2017-03-13 MED ORDER — OXYCODONE-ACETAMINOPHEN 5-325 MG PO TABS: 1.0000 | ORAL_TABLET | ORAL | 0 refills | Status: DC | PRN

## 2017-03-13 MED ORDER — MORPHINE SULFATE (PF) 4 MG/ML IV SOLN
4.0000 mg | Freq: Once | INTRAVENOUS | Status: AC
  Administered 2017-03-13: 4 mg via INTRAVENOUS
  Filled 2017-03-13: qty 1

## 2017-03-13 MED ORDER — HYDROMORPHONE HCL 1 MG/ML IJ SOLN
1.0000 mg | Freq: Once | INTRAMUSCULAR | Status: AC
  Administered 2017-03-13: 1 mg via INTRAVENOUS
  Filled 2017-03-13: qty 1

## 2017-03-13 MED ORDER — ONDANSETRON HCL 4 MG PO TABS: 4.0000 mg | ORAL_TABLET | Freq: Three times a day (TID) | ORAL | 0 refills | Status: DC | PRN

## 2017-03-13 MED ORDER — IOPAMIDOL (ISOVUE-300) INJECTION 61%
INTRAVENOUS | Status: AC
  Administered 2017-03-13: 100 mL via INTRAVENOUS
  Filled 2017-03-13: qty 100

## 2017-03-13 NOTE — Telephone Encounter (Signed)
Per DPR, spoke with pt's mother.  Advised follow-up in next two to three days.  Will route to Gay Filler to call pt tomorrow.

## 2017-03-13 NOTE — Telephone Encounter (Signed)
Reviewed with nursing manager Jacqueline Snowball, RN. Spoke with patient and patient's mother Jacqueline [name]. Advised will need to be seen at [facility] ER for further evaluation as we cannot rule out other abdominal or intestional problems causing fever. Patient will need to check in to ER and will be under their care. If there are any GYN concerns or problems with evaluation the ER will be able to reach Dr.Miller. Mother and patient are agreeable.  Routing to provider for final review. Patient agreeable to disposition. Will close encounter.

## 2017-03-13 NOTE — Telephone Encounter (Signed)
Spoke with patient's mother Jacqueline [name] at the time of incoming call, okay per DPR. Mother states that the patient is at Urgent Care for lower abdominal pain with a fever of 101. Reports Urgent Care is recommending that the patient be seen with the ER. Mother does not want the patient to go to the ER unless necessary. Patient has endometriosis and forgot to place her Nuvaring when due over the weekend. Patient is currently on her menses and feels pain is related to endometriosis without Nuvaring. Patient has had a stuffy nose and congestion over the last few days and mother feels this is causing her fever. States the patient is not having any nausea or vomiting, but that the Urgent Care MD cannot perform an abdominal exam because the patient is so guarded. Mother states the patient needs an ultrasound and CBC, but the patient prefers to have this with our office. Advised with patient having lower abdominal pain and fever further evaluation is warranted. Mother does not wish to proceed without speaking with a physician at our office.

## 2017-03-13 NOTE — Discharge Instructions (Signed)
Please take your pain medicine and nausea medicine to help with your symptoms and to stay hydrated. Please follow-up with her OB/GYN team for further management of your likely endometriosis pain. If any symptoms change or worsen, please return to the nearest emergency department.

## 2017-03-13 NOTE — Telephone Encounter (Signed)
Routing to [place] as Jacqueline [name]. Please see phone note from this morning.

## 2017-03-13 NOTE — ED Notes (Addendum)
Pt aware to take tylenol or motrin for her fever. Also not to take tylenol with her percocet prescription. She verbalizes understanding at d/c.

## 2017-03-13 NOTE — ED Provider Notes (Signed)
[facility] DEPT Provider Note   CSN: [id number] Arrival date & time: 03/13/17  0347     History   Chief Complaint Chief Complaint  Patient presents with  . Abdominal Pain    HPI Jacqueline [name] is a 26 y.o. female.  The history is provided by the patient, the spouse and medical records. No language interpreter was used.  Abdominal Pain   This is a new problem. The current episode started 6 to 12 hours ago. The problem occurs constantly. The problem has not changed since onset.The pain is located in the suprapubic region, RLQ and LLQ. The pain is at a severity of 9/10. The pain is severe. Associated symptoms include fever and nausea. Pertinent negatives include diarrhea, melena, vomiting, constipation, dysuria, frequency, hematuria and headaches. The symptoms are aggravated by palpation. Nothing relieves the symptoms.    Past Medical History:  Diagnosis Date  . Allergic rhinitis   . Endometriosis   . Thyroid nodule     Patient Active Problem List   Diagnosis Date Noted  . 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 Multinodular goiter 05/29/2016  . Allergic rhinitis   . Neck nodule 05/26/2016    History reviewed. No pertinent surgical history.  OB History    Gravida Para Term Preterm AB Living   1 0 0 0 1 0   SAB TAB Ectopic Multiple Live Births   1 0 0 0         Home Medications    Prior to Admission medications   Medication Sig Start Date End Date Taking? Authorizing Provider  etonogestrel-ethinyl estradiol (NUVARING) 0.12-0.015 MG/24HR vaginal ring Insert a new ring vaginally every 3 weeks for continuous use of NuvaRing. 02/14/17   Regina Eck, CNM  loratadine (CLARITIN) 10 MG tablet Take 10 mg by mouth as needed for allergies.    [provider]  Multiple Vitamins-Minerals (MULTIVITAMIN PO) Take by mouth daily.    [provider]    Family History Family History  Problem Relation Age of Onset  . Breast cancer Mother 75     mastectomy    . Thyroid disease Neg Hx     Social History Social History  Substance Use Topics  . Smoking status: Never Smoker  . Smokeless tobacco: Never Used  . Alcohol use 0.6 - 1.8 oz/week    1 - 3 Standard drinks or equivalent per week     Comment: occasional     Allergies   Amoxicillin-pot clavulanate   Review of Systems Review of Systems  Constitutional: Positive for appetite change, chills and fever. Negative for diaphoresis and fatigue.  HENT: Negative for congestion and rhinorrhea.   Respiratory: Negative for cough, chest tightness, shortness of breath, wheezing and stridor.   Cardiovascular: Negative for leg swelling.  Gastrointestinal: Positive for abdominal pain and nausea. Negative for constipation, diarrhea, melena and vomiting.  Genitourinary: Positive for vaginal bleeding (on menstrual cycle). Negative for decreased urine volume, dysuria, frequency, hematuria, pelvic pain, urgency, vaginal discharge and vaginal pain.  Musculoskeletal: Negative for back pain, neck pain and neck stiffness.  Skin: Negative for rash and wound.  Neurological: Negative for light-headedness and headaches.  All other systems reviewed and are negative.    Physical Exam Updated Vital Signs BP 119/74 (BP Location: Left Arm)   Pulse (!) 109   Temp 98.4 F (36.9 C) (Oral)   Resp 18   LMP 03/13/2017   SpO2 100%   Physical Exam  Constitutional: She is oriented to person, place, and  time. She appears well-developed and well-nourished. No distress.  HENT:  Head: Normocephalic and atraumatic.  Right Ear: External ear normal.  Left Ear: External ear normal.  Nose: Nose normal.  Mouth/Throat: Oropharynx is clear and moist. No oropharyngeal exudate.  Eyes: Conjunctivae and EOM are normal. Pupils are equal, round, and reactive to light.  Neck: Normal range of motion. Neck supple.  Cardiovascular: Normal heart sounds and intact distal pulses.   No murmur heard. Pulmonary/Chest: Effort normal and  breath sounds normal. No stridor. No respiratory distress. She has no wheezes. She has no rales. She exhibits no tenderness.  Abdominal: Normal appearance and bowel sounds are normal. She exhibits no distension. There is tenderness in the right lower quadrant, suprapubic area and left lower quadrant. There is no rigidity, no rebound and no CVA tenderness.    Genitourinary:  Genitourinary Comments: Pt deferred after shared decision making conversation   Musculoskeletal: She exhibits no edema or tenderness.  Neurological: She is alert and oriented to person, place, and time. She has normal reflexes. No sensory deficit. She exhibits normal muscle tone.  Skin: Skin is warm. Capillary refill takes less than 2 seconds. No rash noted. She is not diaphoretic. No erythema.  Psychiatric: She has a normal mood and affect.  Nursing note and vitals reviewed.    ED Treatments / Results  Labs (all labs ordered are listed, but only abnormal results are displayed) Labs Reviewed  COMPREHENSIVE METABOLIC PANEL - Abnormal; Notable for the following:       Result Value   Glucose, Bld 107 (*)    ALT 11 (*)    All other components within normal limits  CBC - Abnormal; Notable for the following:    WBC 14.4 (*)    All other components within normal limits  LIPASE, BLOOD  URINALYSIS, ROUTINE W REFLEX MICROSCOPIC  I-STAT BETA HCG BLOOD, ED ([facility], [facility], [facility] ONLY)  I-STAT CG4 LACTIC ACID, ED    EKG  EKG Interpretation None       Radiology US Transvaginal Non-ob  Result Date: 03/13/2017 CLINICAL DATA:  Midline pelvic pain.  History of endometriosis. G1 P0, LMP 03/11/2017 EXAM: TRANSABDOMINAL AND TRANSVAGINAL ULTRASOUND OF PELVIS DOPPLER ULTRASOUND OF OVARIES TECHNIQUE: Both transabdominal and transvaginal ultrasound examinations of the pelvis were performed. Transabdominal technique was performed for global imaging of the pelvis including uterus, ovaries, adnexal regions, and pelvic cul-de-sac. It was  necessary to proceed with endovaginal exam following the transabdominal exam to visualize the endometrium and ovaries. Color and duplex Doppler ultrasound was utilized to evaluate blood flow to the ovaries. COMPARISON:  CT abdomen pelvis 03/13/2017 Pelvic ultrasound 09/26/2016 FINDINGS: Uterus Measurements: 6.8 x 4.0 x 3.9 cm. No fibroids or other mass visualized. Endometrium Thickness: 3.8 mm, normal.  No focal abnormality visualized. Right ovary Measurements: 8.4 x 5.8 x 5.4 cm. There is a complex area in the right adnexa with homogeneous low-level internal echoes, measuring 7.1 x 3.6 x 4.8 cm, previously 6.0 x 4.1 x 5.6 cm. There is a smaller area with similar characteristics measuring 2.4 x 1.5 x 2.2 cm, previously 4.0 x 2.2 x 2.5 cm. Left ovary Measurements: 7.6 x 4.4 x 4.0 cm. There is a complex lesion in the left adnexa that measures 6.7 x 3.2 x 3.5 cm, previously 3.3 x 2.9 x 3.0 cm Pulsed Doppler evaluation of both ovaries demonstrates normal low-resistance arterial and venous waveforms. Other findings Small amount free fluid in the pelvis, which may be physiologic. IMPRESSION: 1. Bilateral adnexal lesions with  homogeneous internal low level echoes, most consistent with endometriomas. The largest lesions have increased in size from the prior study. A nonemergent pelvic MRI may be helpful for following these lesions over time, as that study would provide the best characterization. 2. No ovarian torsion. 3. Normal appearance of the uterus. Electronically Signed   By: Ulyses Jarred M.D.   On: 03/13/2017 16:46   US Pelvis Complete  Result Date: 03/13/2017 CLINICAL DATA:  Midline pelvic pain.  History of endometriosis. G1 P0, LMP 03/11/2017 EXAM: TRANSABDOMINAL AND TRANSVAGINAL ULTRASOUND OF PELVIS DOPPLER ULTRASOUND OF OVARIES TECHNIQUE: Both transabdominal and transvaginal ultrasound examinations of the pelvis were performed. Transabdominal technique was performed for global imaging of the pelvis including  uterus, ovaries, adnexal regions, and pelvic cul-de-sac. It was necessary to proceed with endovaginal exam following the transabdominal exam to visualize the endometrium and ovaries. Color and duplex Doppler ultrasound was utilized to evaluate blood flow to the ovaries. COMPARISON:  CT abdomen pelvis 03/13/2017 Pelvic ultrasound 09/26/2016 FINDINGS: Uterus Measurements: 6.8 x 4.0 x 3.9 cm. No fibroids or other mass visualized. Endometrium Thickness: 3.8 mm, normal.  No focal abnormality visualized. Right ovary Measurements: 8.4 x 5.8 x 5.4 cm. There is a complex area in the right adnexa with homogeneous low-level internal echoes, measuring 7.1 x 3.6 x 4.8 cm, previously 6.0 x 4.1 x 5.6 cm. There is a smaller area with similar characteristics measuring 2.4 x 1.5 x 2.2 cm, previously 4.0 x 2.2 x 2.5 cm. Left ovary Measurements: 7.6 x 4.4 x 4.0 cm. There is a complex lesion in the left adnexa that measures 6.7 x 3.2 x 3.5 cm, previously 3.3 x 2.9 x 3.0 cm Pulsed Doppler evaluation of both ovaries demonstrates normal low-resistance arterial and venous waveforms. Other findings Small amount free fluid in the pelvis, which may be physiologic. IMPRESSION: 1. Bilateral adnexal lesions with homogeneous internal low level echoes, most consistent with endometriomas. The largest lesions have increased in size from the prior study. A nonemergent pelvic MRI may be helpful for following these lesions over time, as that study would provide the best characterization. 2. No ovarian torsion. 3. Normal appearance of the uterus. Electronically Signed   By: Ulyses Jarred M.D.   On: 03/13/2017 16:46   Ct Abdomen Pelvis W Contrast  Result Date: 03/13/2017 CLINICAL DATA:  Low abdominal pain today with fever and leukocytosis. EXAM: CT ABDOMEN AND PELVIS WITH CONTRAST TECHNIQUE: Multidetector CT imaging of the abdomen and pelvis was performed using the standard protocol following bolus administration of intravenous contrast. CONTRAST:   118mL ISOVUE-300 IOPAMIDOL (ISOVUE-300) INJECTION 61% COMPARISON:  Office pelvic ultrasound 09/26/2016. FINDINGS: Lower chest: Clear lung bases. No significant pleural or pericardial effusion. Hepatobiliary: The liver is normal in density without focal abnormality. No evidence of gallstones, gallbladder wall thickening or biliary dilatation. Pancreas: Unremarkable. No pancreatic ductal dilatation or surrounding inflammatory changes. Spleen: Normal in size without focal abnormality. Adrenals/Urinary Tract: Both adrenal glands appear normal. Tiny low-density renal lesions bilaterally are too small to characterize, although are probably cysts. Mild asymmetric dilatation of the right renal pelvis and calices. No significant ureterectasis or secondary signs of ureteral obstruction. The bladder appears unremarkable. Stomach/Bowel: No evidence of bowel wall thickening, distention or surrounding inflammatory change. The appendix appears normal. Vascular/Lymphatic: There are no enlarged abdominal or pelvic lymph nodes. No significant vascular findings are present. Reproductive: The uterus is retroverted with ill-defined low-density in the endometrial complex. There are complex bilateral adnexal masses measuring 8.0 x 4.9 x 5.6 cm  on the right and 6.7 x 3.6 x 3.7 cm on the left. The left adnexal lesion may reflect 2 adjacent lesions based on previous ultrasound. The bilateral adnexal masses demonstrate thickened walls, internal septations and surrounding soft tissue stranding. They were felt to reflect endometriomas on previous ultrasound. Other: Small amount of pelvic ascites and pelvic soft tissue stranding. No other peritoneal nodularity identified. Musculoskeletal: No acute or worrisome osseous findings. There are bilateral L5 pars defects with a resulting grade 1 anterolisthesis and mild foraminal narrowing bilaterally at L5-S1. IMPRESSION: 1. Enlarging complex bilateral adnexal masses, felt to reflect endometriomas on  previous ultrasound. CT appearance is nonspecific. Follow-up pelvic ultrasound recommended. 2. Nonspecific low-density within the endometrium, likely physiologic. 3. Mild right-sided pelvicaliectasis, possibly from mass effect on the distal right ureter by the complex right adnexal mass. No high-grade ureteral obstruction identified. 4. Bilateral L5 pars defects. Electronically Signed   By: Richardean Sale M.D.   On: 03/13/2017 14:33   [place] Art/ven Flow Abd Pelv Doppler  Result Date: 03/13/2017 CLINICAL DATA:  Midline pelvic pain.  History of endometriosis. G1 P0, LMP 03/11/2017 EXAM: TRANSABDOMINAL AND TRANSVAGINAL ULTRASOUND OF PELVIS DOPPLER ULTRASOUND OF OVARIES TECHNIQUE: Both transabdominal and transvaginal ultrasound examinations of the pelvis were performed. Transabdominal technique was performed for global imaging of the pelvis including uterus, ovaries, adnexal regions, and pelvic cul-de-sac. It was necessary to proceed with endovaginal exam following the transabdominal exam to visualize the endometrium and ovaries. Color and duplex Doppler ultrasound was utilized to evaluate blood flow to the ovaries. COMPARISON:  CT abdomen pelvis 03/13/2017 Pelvic ultrasound 09/26/2016 FINDINGS: Uterus Measurements: 6.8 x 4.0 x 3.9 cm. No fibroids or other mass visualized. Endometrium Thickness: 3.8 mm, normal.  No focal abnormality visualized. Right ovary Measurements: 8.4 x 5.8 x 5.4 cm. There is a complex area in the right adnexa with homogeneous low-level internal echoes, measuring 7.1 x 3.6 x 4.8 cm, previously 6.0 x 4.1 x 5.6 cm. There is a smaller area with similar characteristics measuring 2.4 x 1.5 x 2.2 cm, previously 4.0 x 2.2 x 2.5 cm. Left ovary Measurements: 7.6 x 4.4 x 4.0 cm. There is a complex lesion in the left adnexa that measures 6.7 x 3.2 x 3.5 cm, previously 3.3 x 2.9 x 3.0 cm Pulsed Doppler evaluation of both ovaries demonstrates normal low-resistance arterial and venous waveforms. Other findings  Small amount free fluid in the pelvis, which may be physiologic. IMPRESSION: 1. Bilateral adnexal lesions with homogeneous internal low level echoes, most consistent with endometriomas. The largest lesions have increased in size from the prior study. A nonemergent pelvic MRI may be helpful for following these lesions over time, as that study would provide the best characterization. 2. No ovarian torsion. 3. Normal appearance of the uterus. Electronically Signed   By: Ulyses Jarred M.D.   On: 03/13/2017 16:46    Procedures Procedures (including critical care time)  Medications Ordered in ED Medications  iopamidol (ISOVUE-300) 61 % injection (  Canceled Entry 03/13/17 1316)  sodium chloride 0.9 % bolus 1,000 mL (0 mLs Intravenous Stopped 03/13/17 1558)  morphine 4 MG/ML injection 4 mg (4 mg Intravenous Given 03/13/17 1227)  ondansetron (ZOFRAN) injection 4 mg (4 mg Intravenous Given 03/13/17 1227)  iopamidol (ISOVUE-300) 61 % injection (100 mLs Intravenous Contrast Given 03/13/17 1342)  HYDROmorphone (DILAUDID) injection 1 mg (1 mg Intravenous Given 03/13/17 1558)     Initial Impression / Assessment and Plan / ED Course  I have reviewed the triage vital signs  and the nursing notes.  Pertinent labs & imaging results that were available during my care of the patient were reviewed by me and considered in my medical decision making (see chart for details).     Jacqueline [name] is a 26 y.o. female  with a past medical history significant for endometriosis who presents from urgent care with abdominal pain. Patient says that she was awakened this morning at 1 AM with severe lower abdominal pain. Patient describes her pain as a 7 out of 10 in severity and similar to prior endometriosis pain. Patient does report having right lower quadrant pain. Patient reports chills and some mild nausea. Patient denied vomiting, change in urination, or changes in constipation or diarrhea. Patient is currently in the middle of her  menstrual cycle. Patient went to urgent care earlier and was found to have a fever, tachycardia, and lower abdominal pain. Patient was sent to the ED for evaluation.  History and exam are seen above. Patient found to have tenderness across the lower abdomen primarily in the right lower quadrant. Patient reports she still has her appendix. Patient's lungs are clear. No peritonitis. No CVA tenderness. No report a vaginal discharge or vaginal bleeding.  Given patient's right lower quadrant pain, fevers, tachycardia, patient needs to be ruled out for appendicitis. Patient had pain medicine, nausea medicine, and fluids were ordered as well as labs. CT study ordered to look for appendicitis.  CT scan showed no evidence of appendicitis but did show evidence of endometriosis. They recommended ultrasound which was ordered.  Ultrasound also demonstrated endometriosis with no evidence of torsion. Given patient's lack of vaginal discharge or pelvic pain, shared decision-making conversation held about a pelvic exam and this was deferred. Do not feel patient has PID. Patient felt better after pain medications and nausea medications, and fluids.  Given the endometriosis as likely cause of pain, patient instructed to follow-up with her OB/GYN team for further management. Patient given prescription for pain and nausea medicine and encouraged to stay hydrated. Patient and husband understood return precautions for any new or worsened symptoms.   Patient had no other questions or concerns and patient was discharged in good condition.   Final Clinical Impressions(s) / ED Diagnoses   Final diagnoses:  Lower abdominal pain  Endometriosis  Abdominal pain, unspecified abdominal location    New Prescriptions New Prescriptions   ONDANSETRON (ZOFRAN) 4 MG TABLET    Take 1 tablet (4 mg total) by mouth every 8 (eight) hours as needed for nausea or vomiting.   OXYCODONE-ACETAMINOPHEN (PERCOCET/ROXICET) 5-325 MG TABLET     Take 1 tablet by mouth every 4 (four) hours as needed for severe pain.   Clinical Impression: 1. Endometriosis   2. Lower abdominal pain   3. Abdominal pain, unspecified abdominal location     Disposition: Discharge  Condition: Good  I have discussed the results, Dx and Tx plan with the pt(& family if present). He/she/they expressed understanding and agree(s) with the plan. Discharge instructions discussed at great length. Strict return precautions discussed and pt &/or family have verbalized understanding of the instructions. No further questions at time of discharge.    New Prescriptions   ONDANSETRON (ZOFRAN) 4 MG TABLET    Take 1 tablet (4 mg total) by mouth every 8 (eight) hours as needed for nausea or vomiting.   OXYCODONE-ACETAMINOPHEN (PERCOCET/ROXICET) 5-325 MG TABLET    Take 1 tablet by mouth every 4 (four) hours as needed for severe pain.    Follow Up: [facility]  OUTPATIENT CLINIC [place] [phone] Schedule an appointment as soon as possible for a visit    [facility] [place] [id number] [place] [phone]  If symptoms worsen  [facility] [address] [place] [id number] [phone] Schedule an appointment as soon as possible for a visit        Fuquan Wilson, Gwenyth Allegra, MD 03/13/17 2031

## 2017-03-13 NOTE — ED Triage Notes (Signed)
Per pt, states she woke up with lower abdominal pain around 0100-states she went to [facility] and their were ketones and protein in her urine-states they sent her here for labs and further testing

## 2017-03-13 NOTE — Telephone Encounter (Signed)
Patient's mom, Maudie Mercury, called to report, "The ED is now waiting on the cat scan results." No need to call back at this time, per Maudie Mercury. FYI only.

## 2017-03-14 NOTE — Telephone Encounter (Signed)
Attempted to reach patient at number provided, [phone], okay per ROI. There was no answer and recording states that the voicemail box is full and is not accepting new messages at this time.

## 2017-03-15 ENCOUNTER — Ambulatory Visit (INDEPENDENT_AMBULATORY_CARE_PROVIDER_SITE_OTHER): Payer: BLUE CROSS/BLUE SHIELD | Admitting: Obstetrics & Gynecology

## 2017-03-15 ENCOUNTER — Other Ambulatory Visit: Payer: Self-pay | Admitting: Obstetrics & Gynecology

## 2017-03-15 VITALS — BP 122/60 | HR 88 | Temp 98.0°F | Resp 14 | Ht 62.0 in | Wt 128.0 lb

## 2017-03-15 DIAGNOSIS — D72829 Elevated white blood cell count, unspecified: Principal | ICD-10-CM

## 2017-03-15 DIAGNOSIS — N83202 Unspecified ovarian cyst, left side: Secondary | ICD-10-CM

## 2017-03-15 DIAGNOSIS — N83201 Unspecified ovarian cyst, right side: Secondary | ICD-10-CM

## 2017-03-15 DIAGNOSIS — N801 Endometriosis of ovary: Secondary | ICD-10-CM

## 2017-03-15 DIAGNOSIS — N839 Noninflammatory disorder of ovary, fallopian tube and broad ligament, unspecified: Secondary | ICD-10-CM

## 2017-03-15 DIAGNOSIS — N131 Hydronephrosis with ureteral stricture, not elsewhere classified: Secondary | ICD-10-CM

## 2017-03-15 DIAGNOSIS — N838 Other noninflammatory disorders of ovary, fallopian tube and broad ligament: Secondary | ICD-10-CM

## 2017-03-15 DIAGNOSIS — N80129 Deep endometriosis of ovary, unspecified ovary: Secondary | ICD-10-CM

## 2017-03-15 LAB — CBC
HCT: 37.5 % (ref 35.0–45.0)
Hemoglobin: 12.4 g/dL (ref 11.7–15.5)
MCH: 30.3 pg (ref 27.0–33.0)
MCHC: 33.1 g/dL (ref 32.0–36.0)
MCV: 91.7 fL (ref 80.0–100.0)
MPV: 8.5 fL (ref 7.5–12.5)
Platelets: 337 10*3/uL (ref 140–400)
RBC: 4.09 MIL/uL (ref 3.80–5.10)
RDW: 12.8 % (ref 11.0–15.0)
WBC: 14.4 10*3/uL — ABNORMAL HIGH (ref 3.8–10.8)

## 2017-03-15 NOTE — Progress Notes (Signed)
GYNECOLOGY  VISIT   HPI: 26 y.o. [id number] Married Caucasian female here for follow up from the ER from Tuesday.  Pt was seen due to significant pelvic pain.  Pt reports she missed restarting her Nuva ring correctly this month.  She's been using it continuously.  Bleeding started on Sunday and she did experience increased pain, however this became acutely worse early Tuesday am so she went to an urgent care.  They recommended ER evaluation due to the location of pain and fever to evaluate for appendicitis.    She had a CBC with mildly elevated WBC ct, CMP, HCG, urinalysis done.  CT showed complex bilateral adnexal masses measuring 8.0 x 4.9 x 5.6cm on the right and 6.7 x 3.6 x 3.7cm on the left.  Mild right pelviectasis was noted, possibly due to mild compression on right side from right ureter.  Ultrasound was performed.  Right complex ovary measured 8.4 x 5.8 x 5.4 with a complex area measuring 7.1 x 3.6 x 4.8cm.  This has enlarged from 6 x 4 x 5.6cm since 11/21/7.  There is a smaller similar appearing area measuring 2.4 x 1.5 x 2.2cm which was 4.0 x 2.2 x 2.5cm.  Left ovary measured 7.5 x 4.4 x 4.0cm.  Complex lesion on this side measures 6.7 x 3.2 x 3.5cm and was previously 3.3 x 2.9 x 3.0cm.  No evidence of torsion noted.  Small amount of fluid in the pelvis noted.    She did have low grade temp at urgent care as well as in the ER but she does not think she's had any additional fevers except she has felt some night sweats.    She reports her pain is much better today.  She was given Zofran and Percocet and Ibuprofen.  She's only taking the Ibuprofen for pain now.  She doesn't like the way the Zofran and Percocet made her feel.  Only having pain with moving from sitting to standing.  She has been out of work the past three days per ER recommendations but states she's going to work tomorrow, no matter what.    She's only bleeding minimally today.  She did replace her Nuva ring on Tuesday.  She feels this  has made the biggest difference.  GYNECOLOGIC HISTORY: Patient's last menstrual period was 03/13/2017. Contraception: Nuvaring   Patient Active Problem List   Diagnosis Date Noted  . 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 Multinodular goiter 05/29/2016  . Allergic rhinitis   . Neck nodule 05/26/2016    Past Medical History:  Diagnosis Date  . Allergic rhinitis   . Endometriosis   . Thyroid nodule     No past surgical history on file.  MEDS:  Reviewed in EPIC and UTD  ALLERGIES: Shellfish allergy and Amoxicillin-pot clavulanate  Family History  Problem Relation Age of Onset  . Breast cancer Mother 90        mastectomy  . Thyroid disease Neg Hx     SH:  Married, non smoker  Review of Systems  Gastrointestinal: Positive for abdominal pain and nausea.  All other systems reviewed and are negative.   PHYSICAL EXAMINATION:    BP 122/60 (BP Location: Right Arm, Patient Position: Sitting, Cuff Size: Normal)   Pulse 88   Temp 98 F (36.7 C) (Oral)   Resp 14   Ht 5\' 2"  (1.575 m)   Wt 128 lb (58.1 kg)   LMP 03/13/2017 Comment: negative HCG blood test 03-13-2017  BMI 23.41 kg/m  General appearance: alert, cooperative and appears stated age CV:  Regular rate and rhythm Lungs:  clear to auscultation, no wheezes, rales or rhonchi, symmetric air entry Abdomen: soft, non-tender; bowel sounds normal; no masses,  no organomegaly  Pelvic: External genitalia:  no lesions              Urethra:  normal appearing urethra with no masses, tenderness or lesions              Bartholins and Skenes: normal                 Vagina: normal appearing vagina with normal color and discharge, no lesions              Cervix: normal              Bimanual Exam:  Uterus and adnexa:  normal sized but feels fixed in the pelvis with bilateral masses noted.  Mildly tender to palpation today.              Anus:  no lesions  Chaperone was present for exam.  Assessment: Pelvic pain Bilateral  complex adnexal masses consistent with endometriomas Mildly elevated WBC ct on Tuesday Mild right hydronephrosis  Plan: Recommended consideration of surgery due to enlarging masses and recent pain.  D/w considering laparoscopic with removal of endometriomas and ablation of endometriosis implants if possible.  D/w pt considering if removal of a tube could be done if a tube was not healthy in appearance.  She is not sure she wants to do this.  Advised just to think about it from ectopic risk in the future as well.  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  As her pain is much improved, feel this is ok to wait to do.  She will consider options and stay on her nuva ring continuously.  She will talk with her mother and husband as well. Repeat CBC today.   ~30 minutes spent with patient >50% of time was in face to face discussion of above.

## 2017-03-15 NOTE — Telephone Encounter (Signed)
Spoke with patient. Patient states that she is still very tender to the touch, but that her pain has drastically decreased since placing her Nuvaring on 03/13/2017. Reports she is taking Ibuprofen 800 mg for discomfort. Unable to take Percocet due to nausea even with taking Zofran 4 mg. Last took ibuprofen at 4 am. Advised patient follow up with Dr.Miller is recommended. Patient is agreeable. Appointment scheduled for today 03/15/2017 at 9:45 am. Aware this is a work in slot.  Routing to provider for final review. Patient agreeable to disposition. Will close encounter.

## 2017-03-15 NOTE — Telephone Encounter (Signed)
Spoke with patient's mother Maudie Mercury, okay per ROI. Mother will contact the patient and have her call the office with an update and to schedule a follow up appointment with Dr.Miller.

## 2017-03-15 NOTE — Telephone Encounter (Signed)
Attempted to reach this patient at number proivded [phone]. There was no answer and recording states that the voicemail box is full and is not accepting new messages at this time.  Attempted to reach mother Harrel Lemon, okay per ROI. There was no answer. Left message to call [place] at [phone].

## 2017-03-16 ENCOUNTER — Encounter: Payer: Self-pay | Admitting: Obstetrics & Gynecology

## 2017-03-16 LAB — DIFFERENTIAL
Basophils Absolute: 0 cells/uL (ref 0–200)
Basophils Relative: 0 %
Eosinophils Absolute: 288 cells/uL (ref 15–500)
Eosinophils Relative: 2 %
Lymphocytes Relative: 19 %
Lymphs Abs: 2736 cells/uL (ref 850–3900)
Monocytes Absolute: 432 cells/uL (ref 200–950)
Monocytes Relative: 3 %
Neutro Abs: 10944 cells/uL — ABNORMAL HIGH (ref 1500–7800)
Neutrophils Relative %: 76 %

## 2017-03-19 ENCOUNTER — Other Ambulatory Visit: Payer: Self-pay | Admitting: Obstetrics & Gynecology

## 2017-03-19 ENCOUNTER — Encounter (HOSPITAL_COMMUNITY): Payer: Self-pay | Admitting: *Deleted

## 2017-03-19 ENCOUNTER — Telehealth: Payer: Self-pay | Admitting: Obstetrics & Gynecology

## 2017-03-19 NOTE — Telephone Encounter (Signed)
Left message to call Kaitlyn at [phone]. 

## 2017-03-19 NOTE — Telephone Encounter (Signed)
Hopefully outpatient but she may end up spending the night depending on findings at time of surgery.  Thanks.

## 2017-03-19 NOTE — Telephone Encounter (Signed)
Spoke with Jacqueline [name], patient's mother, advised of message as seen below from [place]. Mother is agreeable and verbalizes understanding.  Routing to provider for final review. Patient agreeable to disposition. Will close encounter.

## 2017-03-19 NOTE — Telephone Encounter (Addendum)
Spoke with patient's mother Jacqueline [name], okay per ROI. 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 Advised the patient is scheduled for surgery tomorrow 03/20/2017 at 11 am tomorrow at the [facility]. The hospital will be contacting the patient directly to discuss arrival time as this will be earlier than scheduled surgery time. Surgery information form reviewed with mother including bowel prep. Mother verbalizes understanding and took notes to notify her daughter. Advised patient will need to call to review with any questions and to ensure she understands instructions. Mother is agreeable. Asking if the patient will be discharged home tomorrow or if she will need to stay over night. Advised Dr.Miller will likely determine this following surgery. Advised will review with Dr.Miller and return call.  Dr.Miller, will this be outpatient? Or will patient stay overnight?

## 2017-03-19 NOTE — Telephone Encounter (Signed)
Patient said she spoke to Dr Sabra Heck yesterday and she wants to schedule surgery for tomorrow.

## 2017-03-19 NOTE — Telephone Encounter (Signed)
Please let pt know she is scheduled for 11:00 tomorrow.  Hospital will be calling.  I do want her to do a bowel prep.  I need to bring instructions to you.  Thanks.

## 2017-03-19 NOTE — Telephone Encounter (Signed)
Routing to Lamont Snowball, RN and [place] regarding surgery scheduling.

## 2017-03-20 ENCOUNTER — Encounter (HOSPITAL_COMMUNITY)
Admission: RE | Disposition: A | Discharge status: Home / Self Care | Payer: Self-pay | Source: Ambulatory Visit | Attending: Obstetrics & Gynecology

## 2017-03-20 ENCOUNTER — Observation Stay (HOSPITAL_COMMUNITY)
Admission: RE | Admit: 2017-03-20 | Discharge: 2017-03-20 | Disposition: A | Discharge status: Home / Self Care | Payer: BLUE CROSS/BLUE SHIELD | Source: Ambulatory Visit | Attending: Obstetrics & Gynecology | Admitting: Obstetrics & Gynecology

## 2017-03-20 ENCOUNTER — Ambulatory Visit (HOSPITAL_COMMUNITY): Payer: BLUE CROSS/BLUE SHIELD | Admitting: Anesthesiology

## 2017-03-20 ENCOUNTER — Encounter (HOSPITAL_COMMUNITY): Payer: Self-pay

## 2017-03-20 DIAGNOSIS — R102 Pelvic and perineal pain: Principal | ICD-10-CM

## 2017-03-20 DIAGNOSIS — N809 Endometriosis, unspecified: Secondary | ICD-10-CM

## 2017-03-20 DIAGNOSIS — N801 Endometriosis of ovary: Secondary | ICD-10-CM

## 2017-03-20 DIAGNOSIS — N803 Endometriosis of pelvic peritoneum: Secondary | ICD-10-CM

## 2017-03-20 DIAGNOSIS — N736 Female pelvic peritoneal adhesions (postinfective): Secondary | ICD-10-CM

## 2017-03-20 DIAGNOSIS — Z793 Long term (current) use of hormonal contraceptives: Secondary | ICD-10-CM

## 2017-03-20 DIAGNOSIS — N739 Female pelvic inflammatory disease, unspecified: Secondary | ICD-10-CM | POA: Diagnosis not present

## 2017-03-20 DIAGNOSIS — N802 Endometriosis of fallopian tube: Secondary | ICD-10-CM | POA: Diagnosis not present

## 2017-03-20 HISTORY — PX: CYSTOSCOPY: SHX5120

## 2017-03-20 HISTORY — PX: LAPAROSCOPIC UNILATERAL SALPINGECTOMY: SHX5934

## 2017-03-20 HISTORY — PX: CHROMOPERTUBATION: SHX6288

## 2017-03-20 HISTORY — PX: LAPAROSCOPY: SHX197

## 2017-03-20 HISTORY — PX: LAPAROSCOPIC OVARIAN CYSTECTOMY: SHX6248

## 2017-03-20 LAB — CBC
HCT: 39 % (ref 36.0–46.0)
Hemoglobin: 13.3 g/dL (ref 12.0–15.0)
MCH: 31.2 pg (ref 26.0–34.0)
MCHC: 34.1 g/dL (ref 30.0–36.0)
MCV: 91.5 fL (ref 78.0–100.0)
Platelets: 443 10*3/uL — ABNORMAL HIGH (ref 150–400)
RBC: 4.26 MIL/uL (ref 3.87–5.11)
RDW: 12.4 % (ref 11.5–15.5)
WBC: 14.6 10*3/uL — ABNORMAL HIGH (ref 4.0–10.5)

## 2017-03-20 LAB — HCG, SERUM, QUALITATIVE: Preg, Serum: NEGATIVE

## 2017-03-20 SURGERY — LAPAROSCOPY OPERATIVE
Anesthesia: General | Site: Uterus | Laterality: Right

## 2017-03-20 MED ORDER — HYDROMORPHONE HCL 1 MG/ML IJ SOLN
0.2500 mg | INTRAMUSCULAR | Status: DC | PRN
  Administered 2017-03-20 (×4): 0.5 mg via INTRAVENOUS

## 2017-03-20 MED ORDER — ACETAMINOPHEN 500 MG PO TABS
ORAL_TABLET | ORAL | Status: AC
  Administered 2017-03-20: 1000 mg via ORAL
  Filled 2017-03-20: qty 2

## 2017-03-20 MED ORDER — METHYLENE BLUE 0.5 % INJ SOLN
INTRAVENOUS | Status: DC | PRN
  Administered 2017-03-20: 10 mL

## 2017-03-20 MED ORDER — SODIUM CHLORIDE 0.9 % IJ SOLN
INTRAMUSCULAR | Status: AC
  Filled 2017-03-20: qty 10

## 2017-03-20 MED ORDER — OXYCODONE HCL 5 MG/5ML PO SOLN: 5.0000 mg | Freq: Once | ORAL | Status: DC | PRN

## 2017-03-20 MED ORDER — SCOPOLAMINE 1 MG/3DAYS TD PT72
1.0000 | MEDICATED_PATCH | Freq: Once | TRANSDERMAL | Status: DC
  Administered 2017-03-20: 1.5 mg via TRANSDERMAL

## 2017-03-20 MED ORDER — ALUM & MAG HYDROXIDE-SIMETH 200-200-20 MG/5ML PO SUSP: 30.0000 mL | ORAL | Status: DC | PRN

## 2017-03-20 MED ORDER — PROPOFOL 10 MG/ML IV BOLUS
INTRAVENOUS | Status: DC | PRN
  Administered 2017-03-20: 180 mg via INTRAVENOUS

## 2017-03-20 MED ORDER — MIDAZOLAM HCL 2 MG/2ML IJ SOLN
INTRAMUSCULAR | Status: AC
  Filled 2017-03-20: qty 2

## 2017-03-20 MED ORDER — PROMETHAZINE HCL 25 MG/ML IJ SOLN: 6.2500 mg | INTRAMUSCULAR | Status: DC | PRN

## 2017-03-20 MED ORDER — ACETAMINOPHEN 325 MG PO TABS: 650.0000 mg | ORAL_TABLET | ORAL | Status: DC | PRN

## 2017-03-20 MED ORDER — LACTATED RINGERS IV SOLN
INTRAVENOUS | Status: DC
  Administered 2017-03-20 (×2): via INTRAVENOUS

## 2017-03-20 MED ORDER — KETOROLAC TROMETHAMINE 30 MG/ML IJ SOLN
30.0000 mg | Freq: Four times a day (QID) | INTRAMUSCULAR | Status: DC
  Administered 2017-03-20: 30 mg via INTRAVENOUS
  Filled 2017-03-20: qty 1

## 2017-03-20 MED ORDER — KETOROLAC TROMETHAMINE 30 MG/ML IJ SOLN
INTRAMUSCULAR | Status: DC | PRN
  Administered 2017-03-20: 30 mg via INTRAVENOUS

## 2017-03-20 MED ORDER — HYDROMORPHONE HCL 1 MG/ML IJ SOLN
INTRAMUSCULAR | Status: AC
  Administered 2017-03-20: 0.5 mg via INTRAVENOUS
  Filled 2017-03-20: qty 1

## 2017-03-20 MED ORDER — IBUPROFEN 800 MG PO TABS
800.0000 mg | ORAL_TABLET | Freq: Three times a day (TID) | ORAL | 0 refills | Status: DC | PRN
Start: 2017-03-20 — End: 2017-03-29

## 2017-03-20 MED ORDER — CIPROFLOXACIN IN D5W 400 MG/200ML IV SOLN
400.0000 mg | INTRAVENOUS | Status: AC
  Administered 2017-03-20: 400 mg via INTRAVENOUS
  Filled 2017-03-20: qty 200

## 2017-03-20 MED ORDER — FENTANYL CITRATE (PF) 100 MCG/2ML IJ SOLN
INTRAMUSCULAR | Status: AC
  Filled 2017-03-20: qty 2

## 2017-03-20 MED ORDER — MIDAZOLAM HCL 2 MG/2ML IJ SOLN
INTRAMUSCULAR | Status: DC | PRN
  Administered 2017-03-20: 1 mg via INTRAVENOUS

## 2017-03-20 MED ORDER — GABAPENTIN 300 MG PO CAPS
ORAL_CAPSULE | ORAL | Status: AC
  Administered 2017-03-20: 300 mg via ORAL
  Filled 2017-03-20: qty 1

## 2017-03-20 MED ORDER — GABAPENTIN 300 MG PO CAPS
300.0000 mg | ORAL_CAPSULE | Freq: Once | ORAL | Status: AC
  Administered 2017-03-20: 300 mg via ORAL

## 2017-03-20 MED ORDER — SODIUM CHLORIDE 0.9 % IJ SOLN
INTRAMUSCULAR | Status: DC | PRN
  Administered 2017-03-20: 10 mL

## 2017-03-20 MED ORDER — LIDOCAINE HCL (CARDIAC) 20 MG/ML IV SOLN
INTRAVENOUS | Status: DC | PRN
  Administered 2017-03-20: 30 mg via INTRAVENOUS
  Administered 2017-03-20: 70 mg via INTRAVENOUS

## 2017-03-20 MED ORDER — ONDANSETRON HCL 4 MG/2ML IJ SOLN
INTRAMUSCULAR | Status: AC
  Filled 2017-03-20: qty 2

## 2017-03-20 MED ORDER — OXYCODONE-ACETAMINOPHEN 5-325 MG PO TABS
1.0000 | ORAL_TABLET | ORAL | Status: DC | PRN
  Administered 2017-03-20: 1 via ORAL
  Filled 2017-03-20: qty 1

## 2017-03-20 MED ORDER — DEXTROSE-NACL 5-0.45 % IV SOLN
INTRAVENOUS | Status: DC
  Administered 2017-03-20: 17:00:00 via INTRAVENOUS
  Administered 2017-03-20: 125 mL/h via INTRAVENOUS

## 2017-03-20 MED ORDER — KETOROLAC TROMETHAMINE 30 MG/ML IJ SOLN: 30.0000 mg | Freq: Four times a day (QID) | INTRAMUSCULAR | Status: DC

## 2017-03-20 MED ORDER — ROCURONIUM BROMIDE 100 MG/10ML IV SOLN
INTRAVENOUS | Status: DC | PRN
  Administered 2017-03-20: 10 mg via INTRAVENOUS
  Administered 2017-03-20: 50 mg via INTRAVENOUS
  Administered 2017-03-20 (×2): 5 mg via INTRAVENOUS

## 2017-03-20 MED ORDER — SCOPOLAMINE 1 MG/3DAYS TD PT72
MEDICATED_PATCH | TRANSDERMAL | Status: AC
  Administered 2017-03-20: 1.5 mg via TRANSDERMAL
  Filled 2017-03-20: qty 1

## 2017-03-20 MED ORDER — ONDANSETRON HCL 4 MG/2ML IJ SOLN
INTRAMUSCULAR | Status: DC | PRN
  Administered 2017-03-20: 4 mg via INTRAVENOUS

## 2017-03-20 MED ORDER — FENTANYL CITRATE (PF) 100 MCG/2ML IJ SOLN
INTRAMUSCULAR | Status: DC | PRN
  Administered 2017-03-20 (×2): 25 ug via INTRAVENOUS
  Administered 2017-03-20 (×2): 50 ug via INTRAVENOUS
  Administered 2017-03-20: 25 ug via INTRAVENOUS
  Administered 2017-03-20: 50 ug via INTRAVENOUS
  Administered 2017-03-20: 25 ug via INTRAVENOUS
  Administered 2017-03-20 (×2): 50 ug via INTRAVENOUS

## 2017-03-20 MED ORDER — SUGAMMADEX SODIUM 200 MG/2ML IV SOLN
INTRAVENOUS | Status: AC
  Filled 2017-03-20: qty 2

## 2017-03-20 MED ORDER — LIDOCAINE HCL (CARDIAC) 20 MG/ML IV SOLN
INTRAVENOUS | Status: AC
  Filled 2017-03-20: qty 5

## 2017-03-20 MED ORDER — OXYCODONE HCL 5 MG PO TABS: 5.0000 mg | ORAL_TABLET | Freq: Once | ORAL | Status: DC | PRN

## 2017-03-20 MED ORDER — MENTHOL 3 MG MT LOZG: 1.0000 | LOZENGE | OROMUCOSAL | Status: DC | PRN

## 2017-03-20 MED ORDER — BUPIVACAINE HCL (PF) 0.25 % IJ SOLN
INTRAMUSCULAR | Status: AC
  Filled 2017-03-20: qty 30

## 2017-03-20 MED ORDER — MORPHINE SULFATE (PF) 4 MG/ML IV SOLN: 1.0000 mg | INTRAVENOUS | Status: DC | PRN

## 2017-03-20 MED ORDER — SIMETHICONE 80 MG PO CHEW: 80.0000 mg | CHEWABLE_TABLET | Freq: Four times a day (QID) | ORAL | Status: DC | PRN

## 2017-03-20 MED ORDER — METRONIDAZOLE IN NACL 5-0.79 MG/ML-% IV SOLN
500.0000 mg | INTRAVENOUS | Status: AC
  Administered 2017-03-20: 500 mg via INTRAVENOUS
  Filled 2017-03-20: qty 100

## 2017-03-20 MED ORDER — METHYLENE BLUE 0.5 % INJ SOLN
INTRAVENOUS | Status: AC
  Filled 2017-03-20: qty 10

## 2017-03-20 MED ORDER — CELECOXIB 200 MG PO CAPS
200.0000 mg | ORAL_CAPSULE | Freq: Once | ORAL | Status: AC
  Administered 2017-03-20: 200 mg via ORAL

## 2017-03-20 MED ORDER — FENTANYL CITRATE (PF) 250 MCG/5ML IJ SOLN
INTRAMUSCULAR | Status: AC
  Filled 2017-03-20: qty 5

## 2017-03-20 MED ORDER — BUPIVACAINE HCL (PF) 0.25 % IJ SOLN
INTRAMUSCULAR | Status: DC | PRN
  Administered 2017-03-20: 8.5 mL

## 2017-03-20 MED ORDER — SUGAMMADEX SODIUM 200 MG/2ML IV SOLN
INTRAVENOUS | Status: DC | PRN
  Administered 2017-03-20: 116.2 mg via INTRAVENOUS

## 2017-03-20 MED ORDER — ACETAMINOPHEN 160 MG/5ML PO SOLN: 1000.0000 mg | Freq: Four times a day (QID) | ORAL | Status: DC | PRN

## 2017-03-20 MED ORDER — ACETAMINOPHEN 500 MG PO TABS
1000.0000 mg | ORAL_TABLET | Freq: Once | ORAL | Status: AC
  Administered 2017-03-20: 1000 mg via ORAL

## 2017-03-20 MED ORDER — PROPOFOL 10 MG/ML IV BOLUS
INTRAVENOUS | Status: AC
  Filled 2017-03-20: qty 20

## 2017-03-20 MED ORDER — DEXAMETHASONE SODIUM PHOSPHATE 10 MG/ML IJ SOLN
INTRAMUSCULAR | Status: DC | PRN
  Administered 2017-03-20: 4 mg via INTRAVENOUS

## 2017-03-20 MED ORDER — CELECOXIB 200 MG PO CAPS
ORAL_CAPSULE | ORAL | Status: AC
  Administered 2017-03-20: 200 mg via ORAL
  Filled 2017-03-20: qty 1

## 2017-03-20 MED ORDER — ACETAMINOPHEN 500 MG PO TABS
ORAL_TABLET | ORAL | Status: AC
  Filled 2017-03-20: qty 1

## 2017-03-20 MED ORDER — STERILE WATER FOR IRRIGATION IR SOLN
Status: DC | PRN
  Administered 2017-03-20: 1000 mL

## 2017-03-20 SURGICAL SUPPLY — 49 items
APPLICATOR ARISTA FLEXITIP XL (MISCELLANEOUS) IMPLANT
BENZOIN TINCTURE PRP APPL 2/3 (GAUZE/BANDAGES/DRESSINGS) ×5 IMPLANT
CABLE HIGH FREQUENCY MONO STRZ (ELECTRODE) ×5 IMPLANT
CATH ROBINSON RED A/P 16FR (CATHETERS) IMPLANT
CHLORAPREP W/TINT 26ML (MISCELLANEOUS) ×5 IMPLANT
CLOTH BEACON ORANGE TIMEOUT ST (SAFETY) ×5 IMPLANT
DERMABOND ADVANCED (GAUZE/BANDAGES/DRESSINGS)
DERMABOND ADVANCED .7 DNX12 (GAUZE/BANDAGES/DRESSINGS) IMPLANT
DRSG OPSITE POSTOP 3X4 (GAUZE/BANDAGES/DRESSINGS) ×5 IMPLANT
DURAPREP 26ML APPLICATOR (WOUND CARE) IMPLANT
ELECT REM PT RETURN 9FT ADLT (ELECTROSURGICAL) ×5
ELECTRODE REM PT RTRN 9FT ADLT (ELECTROSURGICAL) ×4 IMPLANT
FILTER SMOKE EVAC LAPAROSHD (FILTER) ×5 IMPLANT
GLOVE BIOGEL PI IND STRL 7.0 (GLOVE) ×16 IMPLANT
GLOVE BIOGEL PI INDICATOR 7.0 (GLOVE) ×4
GLOVE ECLIPSE 6.5 STRL STRAW (GLOVE) ×5 IMPLANT
GOWN STRL REUS W/TWL LRG LVL3 (GOWN DISPOSABLE) ×5 IMPLANT
HEMOSTAT ARISTA ABSORB 3G PWDR (MISCELLANEOUS) IMPLANT
NEEDLE INSUFFLATION 120MM (ENDOMECHANICALS) ×5 IMPLANT
PACK LAPAROSCOPY BASIN (CUSTOM PROCEDURE TRAY) ×5 IMPLANT
PACK TRENDGUARD 450 HYBRID PRO (MISCELLANEOUS) ×4 IMPLANT
PACK TRENDGUARD 600 HYBRD PROC (MISCELLANEOUS) IMPLANT
POUCH LAPAROSCOPIC INSTRUMENT (MISCELLANEOUS) ×5 IMPLANT
POUCH SPECIMEN RETRIEVAL 10MM (ENDOMECHANICALS) IMPLANT
PROTECTOR NERVE ULNAR (MISCELLANEOUS) ×10 IMPLANT
SEALER TISSUE G2 CVD JAW 35 (ENDOMECHANICALS) IMPLANT
SEALER TISSUE G2 CVD JAW 45CM (ENDOMECHANICALS)
SET CYSTO W/LG BORE CLAMP LF (SET/KITS/TRAYS/PACK) ×5 IMPLANT
SET IRRIG TUBING LAPAROSCOPIC (IRRIGATION / IRRIGATOR) ×10 IMPLANT
SHEARS HARMONIC ACE PLUS 36CM (ENDOMECHANICALS) ×5 IMPLANT
SLEEVE ADV FIXATION 5X100MM (TROCAR) ×5 IMPLANT
STRIP CLOSURE SKIN 1/2X4 (GAUZE/BANDAGES/DRESSINGS) ×5 IMPLANT
SUT VIC AB 0 CT1 27 (SUTURE) ×1
SUT VIC AB 0 CT1 27XBRD ANBCTR (SUTURE) ×4 IMPLANT
SUT VICRYL 0 UR6 27IN ABS (SUTURE) IMPLANT
SUT VICRYL 4-0 PS2 18IN ABS (SUTURE) ×5 IMPLANT
SYR 30ML LL (SYRINGE) IMPLANT
SYSTEM CARTER THOMASON II (TROCAR) ×5 IMPLANT
TIP UTERINE 5.1X6CM LAV DISP (MISCELLANEOUS) ×5 IMPLANT
TOWEL OR 17X24 6PK STRL BLUE (TOWEL DISPOSABLE) ×10 IMPLANT
TRAY FOLEY CATH SILVER 14FR (SET/KITS/TRAYS/PACK) ×5 IMPLANT
TRENDGUARD 450 HYBRID PRO PACK (MISCELLANEOUS) ×5
TRENDGUARD 600 HYBRID PROC PK (MISCELLANEOUS)
TROCAR ADV FIXATION 5X100MM (TROCAR) ×5 IMPLANT
TROCAR SLEEVE XCEL 5X75 (ENDOMECHANICALS) ×5 IMPLANT
TROCAR XCEL NON BLADE 8MM B8LT (ENDOMECHANICALS) ×5 IMPLANT
TROCAR XCEL NON-BLD 11X100MML (ENDOMECHANICALS) ×5 IMPLANT
TROCAR XCEL NON-BLD 5MMX100MML (ENDOMECHANICALS) ×5 IMPLANT
WARMER LAPAROSCOPE (MISCELLANEOUS) ×5 IMPLANT

## 2017-03-20 NOTE — Anesthesia Postprocedure Evaluation (Signed)
Anesthesia Post Note  Patient: Jacqueline [name]  Procedure(s) Performed: Procedure(s) (LRB): LAPAROSCOPY OPERATIVE  WITH LYSIS OF ADHESIONS (Bilateral) LAPAROSCOPIC OVARIAN CYSTECTOMY (Bilateral) CYSTOSCOPY (N/A) CHROMOPERTUBATION (Bilateral) LAPAROSCOPIC UNILATERAL SALPINGECTOMY (Right)  Patient location during evaluation: PACU Anesthesia Type: General Level of consciousness: awake and alert Pain management: pain level controlled Vital Signs Assessment: post-procedure vital signs reviewed and stable Respiratory status: spontaneous breathing, nonlabored ventilation, respiratory function stable and patient connected to nasal cannula oxygen Cardiovascular status: blood pressure returned to baseline and stable Postop Assessment: no signs of nausea or vomiting Anesthetic complications: no        Last Vitals:  Vitals:   03/20/17 1600 03/20/17 1615  BP:  126/78  Pulse: 69 94  Resp: 12 18  Temp:  36.8 C    Last Pain:  Vitals:   03/20/17 1615  TempSrc: Oral  PainSc: 4    Pain Goal: Patients Stated Pain Goal: 3 (03/20/17 0925)               Tiajuana Amass

## 2017-03-20 NOTE — OR Nursing (Signed)
Surgical update called to family.

## 2017-03-20 NOTE — H&P (Addendum)
Jacqueline [name] is an 26 y.o. female G73A1 MWF here for laparoscopic treatment of bilateral endometriomas.  She is having pain that started aout 10 days ago.  She was evaluated in the emergency room one week ago.  She was seen in the office on Thursday and surgery was planned.  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  Pain has persisted and she desired to proceed with surgery if possible.  Procedure, risks and benefits have all been discussed today.  Hospital stay, recovery and pain management all discussed.  Risks discussed including but not limited to bleeding, 1<% risk of receiving a  transfusion, infection, risk of bowel/bladder/ureteral/vascular injury discussed as well as possible need for additional surgery if injury does occur discussed.  DVT/PE and rare risk of death discussed.  My actual complications with prior surgeries discussed.  Hernia formation discussed.  Positioning and incision locations discussed.  She is aware additional treatment for endometriosis is likely.   Due to her current pain she does not want to try and other more conservative options.  She is here and ready to proceed.    Pertinent Gynecological History: Menses: typically misses cycle due to continuous Nuva ring use Contraception: NuvaRing vaginal inserts DES exposure: denies Blood transfusions: none Sexually transmitted diseases: no past history Previous GYN Procedures: none  Last mammogram: n/a Last pap: normal Date: 6/17 OB History: G1, P0   Menstrual History: Patient's last menstrual period was 03/13/2017.    Past Medical History:  Diagnosis Date  . Allergic rhinitis   . Endometriosis   . Thyroid nodule     Past Surgical History:  Procedure Laterality Date  . FOOT SURGERY Left    neuromona  . WISDOM TOOTH EXTRACTION      Family History  Problem Relation Age of Onset  . Breast cancer Mother 68        mastectomy  . Thyroid disease Neg Hx     Social  History:  reports that she has never smoked. She has never used smokeless tobacco. She reports that she drinks about 0.6 - 1.8 oz of alcohol per week . She reports that she does not use drugs.  Allergies:  Allergies  Allergen Reactions  . Shellfish Allergy Anaphylaxis  . Amoxicillin-Pot Clavulanate Other (See Comments)    Does not remember    Prescriptions Prior to Admission  Medication Sig Dispense Refill Last Dose  . etonogestrel-ethinyl estradiol (NUVARING) 0.12-0.015 MG/24HR vaginal ring Insert a new ring vaginally every 3 weeks for continuous use of NuvaRing. 1 each 0 Past Week at Unknown time  . ibuprofen (ADVIL,MOTRIN) 200 MG tablet Take 200 mg by mouth every 8 (eight) hours as needed.   Past Week at Unknown time  . loratadine (CLARITIN) 10 MG tablet Take 10 mg by mouth as needed for allergies.   03/19/2017 at Unknown time    Review of Systems  Genitourinary:       Pelvic pain  All other systems reviewed and are negative.   Blood pressure 110/68, pulse 87, temperature 98.1 F (36.7 C), temperature source Oral, resp. rate 16, height 5\' 2"  (1.575 m), weight 128 lb (58.1 kg), last menstrual period 03/13/2017, SpO2 100 %. Physical Exam  Constitutional: She appears well-developed and well-nourished.  Cardiovascular: Normal rate and regular rhythm.   Respiratory: Effort normal and breath sounds normal.  GI: Soft. Bowel sounds are normal.  Skin: Skin is warm and dry.  Psychiatric: She has a  normal mood and affect.    Results for orders placed or performed during the hospital encounter of 03/20/17 (from the past 24 hour(s))  CBC     Status: Abnormal   Collection Time: 03/20/17  9:24 AM  Result Value Ref Range   WBC 14.6 (H) 4.0 - 10.5 K/uL   RBC 4.26 3.87 - 5.11 MIL/uL   Hemoglobin 13.3 12.0 - 15.0 g/dL   HCT 39.0 36.0 - 46.0 %   MCV 91.5 78.0 - 100.0 fL   MCH 31.2 26.0 - 34.0 pg   MCHC 34.1 30.0 - 36.0 g/dL   RDW 12.4 11.5 - 15.5 %   Platelets 443 (H) 150 - 400 K/uL   hCG, serum, qualitative     Status: None   Collection Time: 03/20/17  9:24 AM  Result Value Ref Range   Preg, Serum NEGATIVE NEGATIVE    No results found.  Assessment/Plan: 26 yo G1A1 MWF with bilateral endometriomas and pelvic pain here for laparoscopic treatment.  All questions answered.  Pt ready to proceed.  Hale Bogus SUZANNE 03/20/2017, 10:29 AM

## 2017-03-20 NOTE — Anesthesia Preprocedure Evaluation (Addendum)
Anesthesia Evaluation  Patient identified by MRN, date of birth, ID band Patient awake    Reviewed: Allergy & Precautions, NPO status , Patient's Chart, lab work & pertinent test results  Airway Mallampati: I  TM Distance: >3 FB Neck ROM: Full    Dental no notable dental hx.    Pulmonary neg pulmonary ROS,    Pulmonary exam normal breath sounds clear to auscultation       Cardiovascular negative cardio ROS Normal cardiovascular exam Rhythm:Regular Rate:Normal     Neuro/Psych negative neurological ROS  negative psych ROS   GI/Hepatic negative GI ROS, Neg liver ROS,   Endo/Other  negative endocrine ROS  Renal/GU negative Renal ROS  negative genitourinary   Musculoskeletal negative musculoskeletal ROS (+)   Abdominal   Peds negative pediatric ROS (+)  Hematology negative hematology ROS (+)   Anesthesia Other Findings   Reproductive/Obstetrics negative OB ROS                            Anesthesia Physical Anesthesia Plan  ASA: I  Anesthesia Plan: General   Post-op Pain Management:    Induction: Intravenous  Airway Management Planned: Oral ETT  Additional Equipment:   Intra-op Plan:   Post-operative Plan: Extubation in OR  Informed Consent: I have reviewed the patients History and Physical, chart, labs and discussed the procedure including the risks, benefits and alternatives for the proposed anesthesia with the patient or authorized representative who has indicated his/her understanding and acceptance.   Dental advisory given  Plan Discussed with: CRNA and Surgeon  Anesthesia Plan Comments:         Anesthesia Quick Evaluation

## 2017-03-20 NOTE — Progress Notes (Signed)
Day of Surgery Procedure(s) (LRB): LAPAROSCOPY OPERATIVE  WITH LYSIS OF ADHESIONS (Bilateral) LAPAROSCOPIC OVARIAN CYSTECTOMY (Bilateral) CYSTOSCOPY (N/A) CHROMOPERTUBATION (Bilateral) LAPAROSCOPIC UNILATERAL SALPINGECTOMY (Right)  Subjective: Patient reports good pain control.  She is tolerating regular diet.  She has voided.  She would like to go home.  Objective: Vitals:   03/20/17 1600 03/20/17 1615 03/20/17 1721 03/20/17 1945  BP:  126/78 128/73 120/74  Pulse: 69 94 (!) 56 68  Resp: 12 18 18 16   Temp:  98.2 F (36.8 C) 97.6 F (36.4 C) 99.2 F (37.3 C)  TempSrc:  Oral Oral Oral  SpO2: 99% 98% 98% 100%  Weight:      Height:        General: alert and cooperative Resp: clear to auscultation bilaterally Cardio: regular rate and rhythm, S1, S2 normal, no murmur, click, rub or gallop GI: incision: clean, dry and intact and abd soft, appropriately tender, +BS present Extremities: extremities normal, atraumatic, no cyanosis or edema Vaginal Bleeding: none  Assessment: s/p Procedure(s) with comments: LAPAROSCOPY OPERATIVE  WITH LYSIS OF ADHESIONS (Bilateral) LAPAROSCOPIC OVARIAN CYSTECTOMY (Bilateral) CYSTOSCOPY (N/A) CHROMOPERTUBATION (Bilateral) - Fallopian tubes LAPAROSCOPIC UNILATERAL SALPINGECTOMY (Right): stable and progressing well  Plan: Discharge home  LOS: 0 days    Hale Bogus SUZANNE 03/20/2017, 8:45 PM

## 2017-03-20 NOTE — Discharge Instructions (Signed)
Post-surgical Instructions, Outpatient Surgery  You may expect to feel dizzy, weak, and drowsy for as long as 24 hours after receiving the medicine that made you sleep (anesthetic). For the first 24 hours after your surgery:    Do not drive a car, ride a bicycle, participate in physical activities, or take public transportation until you are done taking narcotic pain medicines or as directed by Dr. Sabra Heck.   Do not drink alcohol or take tranquilizers.   Do not take medicine that has not been prescribed by your physicians.   Do not sign important papers or make important decisions while on narcotic pain medicines.   Have a responsible person with you.   CARE OF INCISION  If you have a bandage, you may remove it in one day.  There is skin glue on your incisions.  Just let this come off on its own.  You may shower on the first day after your surgery.  Do not sit in a tub bath for one week.  Avoid heavy lifting (more than 10 pounds/4.5 kilograms), pushing, or pulling.   Avoid activities that may risk injury to your incisions.   PAIN MANAGEMENT  Motrin 800mg .  (This is the same as 4-200mg  over the counter tablets of Motrin or ibuprofen.)  You may take this every eight hours or as needed for cramping.    As you have both Vicodin and Percocet prescriptions that have been previously filled. You can taken either.  Instructions are as follows:  Vicodin (hydrocodone/APAP) 5/325mg .  Take 1-2 tablets every 4-5 hours as needed OR Percocet (oxycodone/APAP) 5/325 1-2 tabs every 4-6 hours as needed.  (Remember that narcotic pain medications increase your risk of constipation.  If this becomes a problem, you may take an over the counter stool softener like Colace 100mg  up to four times a day.)  DO'S AND DON'T'S  Do not take a tub bath for one week.  You may shower on the first day after your surgery  Do not do any heavy lifting for one to two weeks.  This increases the chance of bleeding.  Do move  around as you feel able.  Stairs are fine.  You may begin to exercise again as you feel able.  Do not lift any weights for two weeks.  Do not put anything in the vagina for two weeks--no tampons, intercourse, or douching.    Do leave the scopolamine patch on behind your ear for three full days.  Wash your hands well when you take it off of your skin.  REGULAR MEDIATIONS/VITAMINS:  You may restart all of your regular medications as prescribed.  Make sure to get a new nuva ring from the pharmacy and place it vaginally tomorrow.    You may restart all of your vitamins as you normally take them.    PLEASE CALL OR SEEK MEDICAL CARE IF:  You have persistent nausea and vomiting.   You have trouble eating or drinking.   You have an oral temperature above 100.5.   You have constipation that is not helped by adjusting diet or increasing fluid intake. Pain medicines are a common cause of constipation.   You have heavy vaginal bleeding  You have redness or drainage from your incision(s) or there is increasing pain or tenderness near or in the surgical site.

## 2017-03-20 NOTE — Op Note (Addendum)
03/20/2017  2:40 PM  PATIENT:  Jacqueline [name]  26 y.o. female  PRE-OPERATIVE DIAGNOSIS:  pelvic pain, bilateral endometriomas  POST-OPERATIVE DIAGNOSIS:  pelvic pain, bilateral endometriomas, Stage IV endometriosis pelvic adhesions  PROCEDURE:  Procedure(s): LAPAROSCOPY OPERATIVE  WITH LYSIS OF ADHESIONS LAPAROSCOPIC RIGHT OVARIAN CYSTECTOMY X 3 LAPAROSCOPIC LEFT OVARIAN CYSTECTOMY X 1 CHROMOPERTUBATION LAPAROSCOPIC RIGHT SALPINGECTOMY CYSTOSCOPY  SURGEON:  Kariya Lavergne SUZANNE  ASSISTANTS: JERTSON, JILL   ANESTHESIA:   general  ESTIMATED BLOOD LOSS: 300  BLOOD ADMINISTERED:none   FLUIDS: 2000ccLR  UOP: 150cc  SPECIMEN:  Right fallopian tube, three right ovarian cysts (two of these were endometriomas and one appeared to be a hemorrhagic cyst, one left ovarian cyst  DISPOSITION OF SPECIMEN:  PATHOLOGY  FINDINGS: Stage IV endometriosis with enlarged bilateral ovaries  PROCEDURE:Patient is taken to the operating room. She is placed in the supine position. She is a running IV in place. Informed consent was present on the chart. SCDs on her lower extremities and functioning properly. General endotracheal anesthesia was administered by the anesthesia staff without difficulty.  Once adequate anesthesia was confirmed the legs are placed in the low lithotomy position in [place]. Her arms were tucked by the side.   Chlora prep was then used to prep the abdomen and technicare was used to prep the inner thighs, perineum and vagina. Once 3 minutes had past the patient was draped in a normal standard fashion. The legs were lifted to the high lithotomy position. The cervix was visualized by placing a heavy weighted speculum in the posterior aspect of the vagina and using a curved Deaver retractor to the retract anteriorly. The anterior lip of the cervix was grasped with single-tooth tenaculum.  The cervix sounded to 6cm. Pratt dilators were used to dilate the cervix up to a #21. A RUMI  uterine manipulator was obtained. A #6 disposable tip was placed on the RUMI manipulator.  This was passed through the cervix and inflated with 10 cc of normal saline. The tenaculum was removed.  The speculum and retractor were removed as well. A Foley catheter was placed to straight drain. Clear urine was noted. Legs were lowered to the low lithotomy position and attention was turned the abdomen.  The umbilicus was everted.  A Veress needle was obtained. Syringe of sterile saline was placed on a open Veress needle.  This was passed into the umbilicus until just when the fluid started to drip.  Then low flow CO2 gas was attached the needle and the pneumoperitoneum was achieved without difficulty. Once four liters of gas was in the abdomen the Veress needle was removed and a 5 millimeter non-bladed Optiview trocar and port were passed directly to the abdomen. The laparoscope was then used to confirm intraperitoneal placement. Locations for RLQ and LLQ ports were noted by transillumination of the abdominal wall.  0.25% marcaine was used to anesthetize the skin.  68mm skin incisions were made and then 51mm bladed ports were placed.    Bilateral enlarged ovaries were noted.  Decision was made to add a forth port above the pubic symphysis.  This incision was made 4cm above the pubic symphysis.  Skin was anesthetized with 0.25% Marcaine as well.  Incision was made with #11 blade.  Initially a #8 port was placed but this was changed to a 11-12.  Attention was turned to the right ovary.  Ureter was seen.  This ovary was adhered down low in the pelvis and non mobile.  It was free posteriorly  and on the right sidewall.  Decision make to remove the endometriomas initially.  This was done by making an incision along the ovary to identify the cyst wall.  Then the cyst was teased out.  The first cyst was a very large endometrioma.  Once this was fully removed, thee ovary was much more mobile.  Then attention was turned to a  second cyst which was initially thought to be another endometrioma.  In a similar fashion, the ovary was incised and the cyst was teased free from the ovary.  This appeared to be a hemorrhagic cyst and not another endometrioma.  Finally, the third cyst was identified. In a similar fashion, the ovary was incised and the cyst teased out.  This appeared to be an endometrioma as well.  Excellent hemostasis in each cyst wall bed was noted.  Attention was paid to staying within the ovary at all times while excising the cysts.  Once this was complete and hemostasis was present, attention was turned to the left side.   The left colon was then taken off the left sidewall to be able to see the left ovary better.  This was done with the Harmonic scalpel and was done without difficulty.  The left ovary was freed with blunt dissection.  Then, in a similar fashion, the ovary was incised with the Harmonic scalpel and the ovarian cyst was teased from the left ovary.  Hemostasis was achieved.     Next, chromopertubation was performed.  Dye spilled easily from left tube.  Right tube was edematous and enlarged and no evidence of dye spillage was seen.  Due to size of tube, decision was made to remove it.  Using the harmonic scalpel, the tube was excised of the mesosalpinx staying clear from the IP ligament.  This was done without difficulty.  The tube was removed.  Excellent hemostasis was noted.  Pelvic was irrigated.  Excellent hemostasis was noted.  Pt was taken out of Trendelenburg position to remove fluid and any blood from the upper abdomen.   Pt was placed back in Trendelenburg position.  Arista was placed within all cysts to make sure there was no delayed bleeding.    At this point the procedure was ended.  Rectal exam was performed as the colon was pulled up to the posterior aspect of the uterus.  No dissection was performed near the rectum.    Larger port was removed and the fascial layer was closed with 2.0  Vicryl.  RLQ and LLQ ports were removed.  Then the pneumoperitoneum was relieved.  The patient was taken out of Trendelenburg positioning.  Several deep breaths were given to the patient's trying to any gas the abdomen and finally the midline port was removed.  The skin was then closed with subcuticular stitches of 3-0 Vicryl. The skin was cleansed Dermabond was applied.   Urine was clear until the very end of the procedure where it was now pink.  This cleared almost immediately.  Then catheter was removed.  Attention was then turned the vagina and no lacerations were noted.  Cystoscopy was performed.  No sutures or bladder injuries were noted.  Ureters were noted with normal urine jets from each one was seen.  No additional hematuria as noted.  Sponge, lap, needle, initially counts were correct x2. Patient tolerated the procedure very well. She was awakened from anesthesia, extubated and taken to recovery in stable condition.   Total surgical time was in excess of 3 hours.  COUNTS:  YES  PLAN OF CARE: Transfer to PACU

## 2017-03-20 NOTE — OR Nursing (Signed)
Surgical update called to family per MD request.

## 2017-03-20 NOTE — Transfer of Care (Signed)
Immediate Anesthesia Transfer of Care Note  Patient: Jacqueline [name]  Procedure(s) Performed: Procedure(s) with comments: LAPAROSCOPY OPERATIVE  WITH LYSIS OF ADHESIONS (Bilateral) LAPAROSCOPIC OVARIAN CYSTECTOMY (Bilateral) CYSTOSCOPY (N/A) CHROMOPERTUBATION (Bilateral) - Fallopian tubes LAPAROSCOPIC UNILATERAL SALPINGECTOMY (Right)  Patient Location: PACU  Anesthesia Type:General  Level of Consciousness: awake, oriented, sedated and patient cooperative  Airway & Oxygen Therapy: Patient Spontanous Breathing and Patient connected to nasal cannula oxygen  Post-op Assessment: Report given to RN and Post -op Vital signs reviewed and stable  Post vital signs: Reviewed and stable  Last Vitals:  Vitals:   03/20/17 0925  BP: 110/68  Pulse: 87  Resp: 16  Temp: 36.7 C    Last Pain:  Vitals:   03/20/17 0925  TempSrc: Oral  PainSc: 3       Patients Stated Pain Goal: 3 (69/79/48 0165)  Complications: No apparent anesthesia complications

## 2017-03-20 NOTE — Progress Notes (Signed)
Patient discharged home with family members.  Discharge instructions reviewed, questions asked and answered.  Denies any pain.  Verbalized understanding of discharge instructions.  To car with family members via wheel chair with Terrilyn Saver, NT.

## 2017-03-20 NOTE — Anesthesia Procedure Notes (Signed)
Procedure Name: Intubation Date/Time: 03/20/2017 11:10 AM Performed by: Tobin Chad Pre-anesthesia Checklist: Patient identified, Emergency Drugs available, Suction available and Patient being monitored Patient Re-evaluated:Patient Re-evaluated prior to inductionOxygen Delivery Method: Simple face mask and Circle system utilized Preoxygenation: Pre-oxygenation with 100% oxygen Intubation Type: IV induction Ventilation: Mask ventilation without difficulty Laryngoscope Size: Mac and 3 Grade View: Grade II Tube type: Oral Tube size: 7.0 mm Number of attempts: 1 Airway Equipment and Method: Stylet Placement Confirmation: ETT inserted through vocal cords under direct vision,  positive ETCO2 and breath sounds checked- equal and bilateral Secured at: 22 (teeth) cm Tube secured with: Tape Dental Injury: Teeth and Oropharynx as per pre-operative assessment

## 2017-03-21 ENCOUNTER — Encounter (HOSPITAL_COMMUNITY): Payer: Self-pay | Admitting: Obstetrics & Gynecology

## 2017-03-21 NOTE — Discharge Summary (Signed)
Physician Discharge Summary  Patient ID: Jacqueline [name] MRN: [id number] DOB/AGE: [birth year] 27 y.o.  Admit date: 03/20/2017 Discharge date: 03/21/2017  Admission Diagnoses: pelvic pain, bilateral endometriomas  Discharge Diagnoses:  Stage 4 endometriosis  Discharged Condition: good  Hospital Course: Patient admitted through same day surgery.  She was taken to OR where operative laparoscopy, removal of bilateral endometriomas and right hemorrhagic cyst, right salpingectomy, extensive LOA were performed.  Surgical findings included enlarged and adhered bilateral ovaries with bilateral endometriomas and right enlarged and edematous fallopian tube.  Surgery was uneventful.  EBL 275cc.  Foley catheter was removed before leaving OR.  Patient transferred to PACU where she was stable and then to 3rd floor for the remainder of her hospitalization.  During her post-op recovery, her vitals and stable and she was AF.  In evening of POD#0, she was able to transition to oral pain medications and regular diet.  She was able to ambulate and she had good pain control.  She was also able to void on her own.  Pt requested discharge home and as she was meeting all criteria, discharge was felt appropriate.  Consults: None  Significant Diagnostic Studies: none except pre op labs.  Pathology from surgery is pending.  Treatments: surgery: as per above  Discharge Exam: Blood pressure 120/74, pulse 68, temperature 99.2 F (37.3 C), temperature source Oral, resp. rate 16, height 5\' 2"  (1.575 m), weight 128 lb (58.1 kg), last menstrual period 03/13/2017, SpO2 100 %. General appearance: alert and cooperative Resp: clear to auscultation bilaterally Cardio: regular rate and rhythm, S1, S2 normal, no murmur, click, rub or gallop GI: soft, appropriately tender, +BS, nondistended Incision/Wound:c/d/i  Disposition: 01-Home or Self Care   Allergies as of 03/20/2017      Reactions   Shellfish Allergy Anaphylaxis   Amoxicillin-pot Clavulanate Other (See Comments)   Does not remember      Medication List    TAKE these medications   etonogestrel-ethinyl estradiol 0.12-0.015 MG/24HR vaginal ring Commonly known as:  NUVARING Insert a new ring vaginally every 3 weeks for continuous use of NuvaRing. Notes to patient:  Your nuva ring was removed in the OR today.  Please get a new one tomorrow and insert it vaginally.   ibuprofen 800 MG tablet Commonly known as:  ADVIL,MOTRIN Take 1 tablet (800 mg total) by mouth every 8 (eight) hours as needed. What changed:  medication strength  how much to take   loratadine 10 MG tablet Commonly known as:  CLARITIN Take 10 mg by mouth as needed for allergies.      Follow-up Information    Megan Salon, MD In 2 weeks.   Specialty:  Gynecology Why:  My office will call to schedule your follow up appointment Contact information: [address] [place] [address] [phone]           Signed: Lyman Speller 03/21/2017, 1:03 PM

## 2017-03-27 ENCOUNTER — Emergency Department (HOSPITAL_COMMUNITY): Payer: BLUE CROSS/BLUE SHIELD

## 2017-03-27 ENCOUNTER — Encounter (HOSPITAL_COMMUNITY): Payer: Self-pay | Admitting: *Deleted

## 2017-03-27 ENCOUNTER — Observation Stay (HOSPITAL_COMMUNITY)
Admission: EM | Admit: 2017-03-27 | Discharge: 2017-03-29 | Disposition: A | Discharge status: Home / Self Care | Payer: BLUE CROSS/BLUE SHIELD | Attending: Internal Medicine | Admitting: Internal Medicine

## 2017-03-27 DIAGNOSIS — N809 Endometriosis, unspecified: Secondary | ICD-10-CM

## 2017-03-27 DIAGNOSIS — D72829 Elevated white blood cell count, unspecified: Secondary | ICD-10-CM

## 2017-03-27 DIAGNOSIS — D72825 Bandemia: Secondary | ICD-10-CM

## 2017-03-27 DIAGNOSIS — Z79899 Other long term (current) drug therapy: Secondary | ICD-10-CM

## 2017-03-27 DIAGNOSIS — K529 Noninfective gastroenteritis and colitis, unspecified: Principal | ICD-10-CM

## 2017-03-27 DIAGNOSIS — D473 Essential (hemorrhagic) thrombocythemia: Secondary | ICD-10-CM

## 2017-03-27 DIAGNOSIS — R9431 Abnormal electrocardiogram [ECG] [EKG]: Secondary | ICD-10-CM

## 2017-03-27 DIAGNOSIS — D75839 Thrombocytosis, unspecified: Secondary | ICD-10-CM | POA: Diagnosis present

## 2017-03-27 DIAGNOSIS — R1013 Epigastric pain: Secondary | ICD-10-CM | POA: Diagnosis present

## 2017-03-27 LAB — COMPREHENSIVE METABOLIC PANEL
ALT: 14 U/L (ref 14–54)
AST: 16 U/L (ref 15–41)
Albumin: 3.7 g/dL (ref 3.5–5.0)
Alkaline Phosphatase: 63 U/L (ref 38–126)
Anion gap: 9 (ref 5–15)
BUN: 16 mg/dL (ref 6–20)
CO2: 26 mmol/L (ref 22–32)
Calcium: 9.1 mg/dL (ref 8.9–10.3)
Chloride: 103 mmol/L (ref 101–111)
Creatinine, Ser: 1.04 mg/dL — ABNORMAL HIGH (ref 0.44–1.00)
GFR calc Af Amer: >60 mL/min (ref >60)
GFR calc non Af Amer: >60 mL/min (ref >60)
Glucose, Bld: 112 mg/dL — ABNORMAL HIGH (ref 65–99)
Potassium: 4 mmol/L (ref 3.5–5.1)
Sodium: 138 mmol/L (ref 135–145)
Total Bilirubin: 0.5 mg/dL (ref 0.3–1.2)
Total Protein: 8 g/dL (ref 6.5–8.1)

## 2017-03-27 LAB — CBC
HCT: 39.2 % (ref 36.0–46.0)
Hemoglobin: 13.2 g/dL (ref 12.0–15.0)
MCH: 30 pg (ref 26.0–34.0)
MCHC: 33.7 g/dL (ref 30.0–36.0)
MCV: 89.1 fL (ref 78.0–100.0)
Platelets: 743 10*3/uL — ABNORMAL HIGH (ref 150–400)
RBC: 4.4 MIL/uL (ref 3.87–5.11)
RDW: 11.9 % (ref 11.5–15.5)
WBC: 17.9 10*3/uL — ABNORMAL HIGH (ref 4.0–10.5)

## 2017-03-27 LAB — URINALYSIS, ROUTINE W REFLEX MICROSCOPIC
Bacteria, UA: NONE SEEN
Bilirubin Urine: NEGATIVE
Glucose, UA: NEGATIVE mg/dL
Ketones, ur: 5 mg/dL — AB
Nitrite: NEGATIVE
Protein, ur: 30 mg/dL — AB
Specific Gravity, Urine: 1.024 (ref 1.005–1.030)
pH: 5 (ref 5.0–8.0)

## 2017-03-27 LAB — POC URINE PREG, ED: Preg Test, Ur: NEGATIVE

## 2017-03-27 LAB — LIPASE, BLOOD: Lipase: 17 U/L (ref 11–51)

## 2017-03-27 MED ORDER — METRONIDAZOLE IN NACL 5-0.79 MG/ML-% IV SOLN
500.0000 mg | Freq: Once | INTRAVENOUS | Status: AC
  Administered 2017-03-28: 500 mg via INTRAVENOUS
  Filled 2017-03-27: qty 100

## 2017-03-27 MED ORDER — CIPROFLOXACIN IN D5W 400 MG/200ML IV SOLN
400.0000 mg | Freq: Once | INTRAVENOUS | Status: AC
  Administered 2017-03-28: 400 mg via INTRAVENOUS
  Filled 2017-03-27: qty 200

## 2017-03-27 MED ORDER — IOPAMIDOL (ISOVUE-300) INJECTION 61%
INTRAVENOUS | Status: AC
  Filled 2017-03-27: qty 100

## 2017-03-27 MED ORDER — SODIUM CHLORIDE 0.9 % IV BOLUS (SEPSIS)
1000.0000 mL | Freq: Once | INTRAVENOUS | Status: AC
  Administered 2017-03-27: 1000 mL via INTRAVENOUS

## 2017-03-27 MED ORDER — IOPAMIDOL (ISOVUE-300) INJECTION 61%
100.0000 mL | Freq: Once | INTRAVENOUS | Status: AC | PRN
  Administered 2017-03-27: 100 mL via INTRAVENOUS

## 2017-03-27 NOTE — ED Notes (Signed)
Patient transported to CT 

## 2017-03-27 NOTE — ED Provider Notes (Signed)
[facility] DEPT Provider Note   CSN: [id number] Arrival date & time: 03/27/17  2034     History   Chief Complaint Chief Complaint  Patient presents with  . Abdominal Pain    HPI Jacqueline [name] is a 26 y.o. female.  The history is provided by the patient.  Abdominal Pain   This is a new problem. The current episode started 6 to 12 hours ago. Episode frequency: intermittent. The problem has not changed since onset.Associated with: recent abd surgery. The pain is located in the epigastric region. The quality of the pain is sharp and cramping. The pain is moderate. Pertinent negatives include anorexia, fever, diarrhea, nausea, vomiting and constipation. Nothing aggravates the symptoms. Nothing relieves the symptoms. Past workup includes surgery (Lap endometrioma removal).    Past Medical History:  Diagnosis Date  . Allergic rhinitis   . Endometriosis   . Thyroid nodule     Patient Active Problem List   Diagnosis Date Noted  . Endometriosis 03/20/2017  . 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 Multinodular goiter 05/29/2016  . Allergic rhinitis   . Neck nodule 05/26/2016    Past Surgical History:  Procedure Laterality Date  . CHROMOPERTUBATION Bilateral 03/20/2017   Procedure: CHROMOPERTUBATION;  Surgeon: Megan Salon, MD;  Location: [facility];  Service: Gynecology;  Laterality: Bilateral;  Fallopian tubes  . CYSTOSCOPY N/A 03/20/2017   Procedure: CYSTOSCOPY;  Surgeon: Megan Salon, MD;  Location: [facility];  Service: Gynecology;  Laterality: N/A;  . FOOT SURGERY Left    neuromona  . LAPAROSCOPIC OVARIAN CYSTECTOMY Bilateral 03/20/2017   Procedure: LAPAROSCOPIC OVARIAN CYSTECTOMY;  Surgeon: Megan Salon, MD;  Location: [facility];  Service: Gynecology;  Laterality: Bilateral;  . LAPAROSCOPIC UNILATERAL SALPINGECTOMY Right 03/20/2017   Procedure: LAPAROSCOPIC UNILATERAL SALPINGECTOMY;  Surgeon: Megan Salon, MD;  Location: [facility];  Service: Gynecology;  Laterality: Right;  .  LAPAROSCOPY Bilateral 03/20/2017   Procedure: LAPAROSCOPY OPERATIVE  WITH LYSIS OF ADHESIONS;  Surgeon: Megan Salon, MD;  Location: [facility];  Service: Gynecology;  Laterality: Bilateral;  . WISDOM TOOTH EXTRACTION      OB History    Gravida Para Term Preterm AB Living   1 0 0 0 1 0   SAB TAB Ectopic Multiple Live Births   1 0 0 0         Home Medications    Prior to Admission medications   Medication Sig Start Date End Date Taking? Authorizing Provider  ibuprofen (ADVIL,MOTRIN) 200 MG tablet Take 800 mg by mouth every 8 (eight) hours as needed for moderate pain.   Yes [provider]  loratadine (CLARITIN) 10 MG tablet Take 10 mg by mouth as needed for allergies.   Yes [provider]  etonogestrel-ethinyl estradiol (NUVARING) 0.12-0.015 MG/24HR vaginal ring Insert a new ring vaginally every 3 weeks for continuous use of NuvaRing. Patient not taking: Reported on 03/27/2017 02/14/17   Regina Eck, CNM  ibuprofen (ADVIL,MOTRIN) 800 MG tablet Take 1 tablet (800 mg total) by mouth every 8 (eight) hours as needed. 03/20/17   Megan Salon, MD    Family History Family History  Problem Relation Age of Onset  . Breast cancer Mother 23        mastectomy  . Thyroid disease Neg Hx     Social History Social History  Substance Use Topics  . Smoking status: Never Smoker  . Smokeless tobacco: Never Used  . Alcohol use 0.6 - 1.8 oz/week  1 - 3 Standard drinks or equivalent per week     Comment: occasional      Allergies   Shellfish allergy and Amoxicillin-pot clavulanate   Review of Systems Review of Systems  Constitutional: Negative for fever.  Gastrointestinal: Positive for abdominal pain. Negative for anorexia, constipation, diarrhea, nausea and vomiting.  All other systems are reviewed and are negative for acute change except as noted in the HPI    Physical Exam Updated Vital Signs BP 115/84 (BP Location: Right Arm)   Pulse (!) 105   Temp 98  F (36.7 C) (Oral)   Resp 18   Ht 5\' 2"  (1.575 m)   Wt 56 kg (123 lb 9 oz)   LMP 03/24/2017 Comment: negative HCG blood test 03-13-2017  SpO2 94%   BMI 22.60 kg/m   Physical Exam  Constitutional: She is oriented to person, place, and time. She appears well-developed and well-nourished. No distress.  HENT:  Head: Normocephalic and atraumatic.  Nose: Nose normal.  Eyes: Conjunctivae and EOM are normal. Pupils are equal, round, and reactive to light. Right eye exhibits no discharge. Left eye exhibits no discharge. No scleral icterus.  Neck: Normal range of motion. Neck supple.  Cardiovascular: Normal rate and regular rhythm.  Exam reveals no gallop and no friction rub.   No murmur heard. Pulmonary/Chest: Effort normal and breath sounds normal. No stridor. No respiratory distress. She has no rales.  Abdominal: Soft. She exhibits distension. There is generalized tenderness (mild ).  Trochar incisions clean, intact, and dry.  Musculoskeletal: She exhibits no edema or tenderness.  Neurological: She is alert and oriented to person, place, and time.  Skin: Skin is warm and dry. No rash noted. She is not diaphoretic. No erythema.  Psychiatric: She has a normal mood and affect.  Vitals reviewed.    ED Treatments / Results  Labs (all labs ordered are listed, but only abnormal results are displayed) Labs Reviewed  COMPREHENSIVE METABOLIC PANEL - Abnormal; Notable for the following:       Result Value   Glucose, Bld 112 (*)    Creatinine, Ser 1.04 (*)    All other components within normal limits  CBC - Abnormal; Notable for the following:    WBC 17.9 (*)    Platelets 743 (*)    All other components within normal limits  URINALYSIS, ROUTINE W REFLEX MICROSCOPIC - Abnormal; Notable for the following:    Hgb urine dipstick MODERATE (*)    Ketones, ur 5 (*)    Protein, ur 30 (*)    Leukocytes, UA TRACE (*)    Squamous Epithelial / LPF 6-30 (*)    All other components within normal limits   CULTURE, BLOOD (ROUTINE X 2)  CULTURE, BLOOD (ROUTINE X 2)  LIPASE, BLOOD  POC URINE PREG, ED  I-STAT CG4 LACTIC ACID, ED    EKG  EKG Interpretation None       Radiology Ct Abdomen Pelvis W Contrast  Addendum Date: 03/27/2017   ADDENDUM REPORT: 03/27/2017 22:45 ADDENDUM: Acute findings discussed with and reconfirmed by Dr.Augustin Bun on 03/27/2017 at 10:40 pm. Electronically Signed   By: Elon Alas M.D.   On: 03/27/2017 22:45   Result Date: 03/27/2017 CLINICAL DATA:  [place] squeezing upper mid abdominal pain beginning at 1330 hours today. History of endometriomas, status post laparoscopic ovarian cystectomy and lysis of adhesions Mar 20, 2017. EXAM: CT ABDOMEN AND PELVIS WITH CONTRAST TECHNIQUE: Multidetector CT imaging of the abdomen and pelvis was performed using  the standard protocol following bolus administration of intravenous contrast. CONTRAST:  174mL ISOVUE-300 IOPAMIDOL (ISOVUE-300) INJECTION 61% COMPARISON:  CT abdomen and pelvis Mar 13, 2017 FINDINGS: LOWER CHEST: Lung bases are clear. Included heart size is normal. No pericardial effusion. HEPATOBILIARY: Liver and gallbladder are normal. PANCREAS: Normal. SPLEEN: Normal. ADRENALS/URINARY TRACT: Kidneys are orthotopic, demonstrating symmetric enhancement. No nephrolithiasis, hydronephrosis or solid renal masses. Too small to characterize hypodensities bilateral kidneys. The unopacified ureters are normal in course and caliber. Urinary bladder is partially distended and unremarkable. Normal adrenal glands. STOMACH/BOWEL: Fluid within the small and large bowel with short segment of ileal circumferential wall thickening and edema. Mural small and large bowel hyperemia with air fluid levels. VASCULAR/LYMPHATIC: Aortoiliac vessels are normal in course and caliber. Somewhat flattened inferior vena cava. No lymphadenopathy by CT size criteria. REPRODUCTIVE: 2.5 x 3.9 cm low-density RIGHT adnexa. OTHER: Moderate amount of ascites. No  intraperitoneal free air focal fluid collection. MUSCULOSKELETAL: Nonacute. Grade 2 L5-S1 anterolisthesis, chronic segmental L5 pars interarticularis defects, severe L5-S1 neural foraminal narrowing. IMPRESSION: Severe enterocolitis, with short segment of severe ileal wall thickening, differential diagnosis includes shock bowel, infectious or inflammatory etiology less likely ischemic bowel considering distribution though not excluded. Moderate amount of ascites. 2.5 x 3.9 cm RIGHT adnexal residual endometrioma versus benign-appearing cyst. Electronically Signed: By: Elon Alas M.D. On: 03/27/2017 22:39    Procedures Procedures (including critical care time)  Medications Ordered in ED Medications  iopamidol (ISOVUE-300) 61 % injection (not administered)  ciprofloxacin (CIPRO) IVPB 400 mg (not administered)  metroNIDAZOLE (FLAGYL) IVPB 500 mg (not administered)  sodium chloride 0.9 % bolus 1,000 mL (1,000 mLs Intravenous New Bag/Given 03/27/17 2230)  iopamidol (ISOVUE-300) 61 % injection 100 mL (100 mLs Intravenous Contrast Given 03/27/17 2211)     Initial Impression / Assessment and Plan / ED Course  I have reviewed the triage vital signs and the nursing notes.  Pertinent labs & imaging results that were available during my care of the patient were reviewed by me and considered in my medical decision making (see chart for details).     Workup consistent with severe enterocolitis of unknown etiology. Possible differential on CT read included shock bowel however patient is hemodynamically stable; infectious however patient was not having any infectious symptoms; inflammatory however patient denied any personal or family history of Crohn's/ulcerative colitis. Labs did reveal significant leukocytosis. Currently awaiting lactic acid. Patient was started on empiric antibiotics in case there is an infectious component.  Discussed case with the hospitalist to admit the patient for further  workup and management.  Final Clinical Impressions(s) / ED Diagnoses   Final diagnoses:  Enterocolitis  Bandemia      Shealeigh Dunstan, Grayce Sessions, MD 03/28/17 [phone]

## 2017-03-27 NOTE — ED Triage Notes (Signed)
Pt states she is having upper midline abdominal pain that started about 1330 today. She describes a dull and squeezing pain that comes and goes. LBM today. Last 800mg  ibuprofen around 1700 and Pepto around 1800 todayOn Tuesday pt had lap "removal of bilateral endometriomas and right hemorrhagic cyst, right salpingectomy" per notes. Pt sent here by her doctor Sabra Heck) for further eval.

## 2017-03-28 DIAGNOSIS — D473 Essential (hemorrhagic) thrombocythemia: Secondary | ICD-10-CM

## 2017-03-28 DIAGNOSIS — N809 Endometriosis, unspecified: Secondary | ICD-10-CM

## 2017-03-28 DIAGNOSIS — R9431 Abnormal electrocardiogram [ECG] [EKG]: Secondary | ICD-10-CM

## 2017-03-28 DIAGNOSIS — D72825 Bandemia: Secondary | ICD-10-CM

## 2017-03-28 DIAGNOSIS — K529 Noninfective gastroenteritis and colitis, unspecified: Principal | ICD-10-CM

## 2017-03-28 DIAGNOSIS — D72829 Elevated white blood cell count, unspecified: Secondary | ICD-10-CM

## 2017-03-28 DIAGNOSIS — D75839 Thrombocytosis, unspecified: Secondary | ICD-10-CM | POA: Diagnosis present

## 2017-03-28 HISTORY — DX: Elevated white blood cell count, unspecified: D72.829

## 2017-03-28 HISTORY — DX: Abnormal electrocardiogram (ECG) (EKG): R94.31

## 2017-03-28 HISTORY — DX: Thrombocytosis, unspecified: D75.839

## 2017-03-28 LAB — BASIC METABOLIC PANEL
Anion gap: 5 (ref 5–15)
BUN: 11 mg/dL (ref 6–20)
CO2: 25 mmol/L (ref 22–32)
Calcium: 7.6 mg/dL — ABNORMAL LOW (ref 8.9–10.3)
Chloride: 108 mmol/L (ref 101–111)
Creatinine, Ser: 0.89 mg/dL (ref 0.44–1.00)
GFR calc Af Amer: >60 mL/min (ref >60)
GFR calc non Af Amer: >60 mL/min (ref >60)
Glucose, Bld: 97 mg/dL (ref 65–99)
Potassium: 3.6 mmol/L (ref 3.5–5.1)
Sodium: 138 mmol/L (ref 135–145)

## 2017-03-28 LAB — I-STAT CG4 LACTIC ACID, ED: Lactic Acid, Venous: 0.46 mmol/L — ABNORMAL LOW (ref 0.5–1.9)

## 2017-03-28 LAB — CBC
HCT: 32.7 % — ABNORMAL LOW (ref 36.0–46.0)
Hemoglobin: 10.9 g/dL — ABNORMAL LOW (ref 12.0–15.0)
MCH: 30.2 pg (ref 26.0–34.0)
MCHC: 33.3 g/dL (ref 30.0–36.0)
MCV: 90.6 fL (ref 78.0–100.0)
Platelets: 496 10*3/uL — ABNORMAL HIGH (ref 150–400)
RBC: 3.61 MIL/uL — ABNORMAL LOW (ref 3.87–5.11)
RDW: 12.1 % (ref 11.5–15.5)
WBC: 11.4 10*3/uL — ABNORMAL HIGH (ref 4.0–10.5)

## 2017-03-28 MED ORDER — ONDANSETRON HCL 4 MG/2ML IJ SOLN: 4.0000 mg | Freq: Four times a day (QID) | INTRAMUSCULAR | Status: DC | PRN

## 2017-03-28 MED ORDER — ENOXAPARIN SODIUM 40 MG/0.4ML ~~LOC~~ SOLN: 40.0000 mg | SUBCUTANEOUS | Status: DC

## 2017-03-28 MED ORDER — ONDANSETRON HCL 4 MG PO TABS: 4.0000 mg | ORAL_TABLET | Freq: Four times a day (QID) | ORAL | Status: DC | PRN

## 2017-03-28 MED ORDER — LORAZEPAM 2 MG/ML IJ SOLN: 0.5000 mg | Freq: Four times a day (QID) | INTRAMUSCULAR | Status: DC | PRN

## 2017-03-28 MED ORDER — SODIUM CHLORIDE 0.9 % IV SOLN
INTRAVENOUS | Status: DC
  Administered 2017-03-28 (×2): via INTRAVENOUS

## 2017-03-28 MED ORDER — MORPHINE SULFATE (PF) 2 MG/ML IV SOLN
2.0000 mg | Freq: Once | INTRAVENOUS | Status: AC
  Administered 2017-03-28: 2 mg via INTRAVENOUS
  Filled 2017-03-28: qty 1

## 2017-03-28 MED ORDER — PROCHLORPERAZINE EDISYLATE 5 MG/ML IJ SOLN
5.0000 mg | Freq: Four times a day (QID) | INTRAMUSCULAR | Status: DC | PRN
  Administered 2017-03-28: 5 mg via INTRAVENOUS
  Filled 2017-03-28: qty 2

## 2017-03-28 MED ORDER — HYDROCODONE-ACETAMINOPHEN 5-325 MG PO TABS
1.0000 | ORAL_TABLET | Freq: Four times a day (QID) | ORAL | Status: DC | PRN
  Administered 2017-03-28 – 2017-03-29 (×3): 1 via ORAL
  Filled 2017-03-28 (×3): qty 1

## 2017-03-28 MED ORDER — ACETAMINOPHEN 650 MG RE SUPP: 650.0000 mg | Freq: Four times a day (QID) | RECTAL | Status: DC | PRN

## 2017-03-28 MED ORDER — ACETAMINOPHEN 325 MG PO TABS: 650.0000 mg | ORAL_TABLET | Freq: Four times a day (QID) | ORAL | Status: DC | PRN

## 2017-03-28 MED ORDER — DIPHENHYDRAMINE HCL 25 MG PO CAPS
25.0000 mg | ORAL_CAPSULE | Freq: Four times a day (QID) | ORAL | Status: DC | PRN
  Administered 2017-03-28: 25 mg via ORAL
  Filled 2017-03-28: qty 1

## 2017-03-28 MED ORDER — SODIUM CHLORIDE 0.9 % IV BOLUS (SEPSIS)
1000.0000 mL | Freq: Once | INTRAVENOUS | Status: AC
  Administered 2017-03-28: 1000 mL via INTRAVENOUS

## 2017-03-28 MED ORDER — DEXTROSE 5 % IV SOLN
1.0000 g | INTRAVENOUS | Status: DC
  Administered 2017-03-28 – 2017-03-29 (×2): 1 g via INTRAVENOUS
  Filled 2017-03-28 (×2): qty 10

## 2017-03-28 MED ORDER — HYDROMORPHONE HCL 4 MG/ML IJ SOLN
0.5000 mg | INTRAMUSCULAR | Status: DC | PRN
  Administered 2017-03-28 (×2): 1 mg via INTRAVENOUS
  Filled 2017-03-28 (×3): qty 1

## 2017-03-28 MED ORDER — METOCLOPRAMIDE HCL 5 MG/5ML PO SOLN: 10.0000 mg | Freq: Four times a day (QID) | ORAL | Status: DC | PRN

## 2017-03-28 MED ORDER — METRONIDAZOLE IN NACL 5-0.79 MG/ML-% IV SOLN
500.0000 mg | Freq: Three times a day (TID) | INTRAVENOUS | Status: DC
  Administered 2017-03-28 – 2017-03-29 (×4): 500 mg via INTRAVENOUS
  Filled 2017-03-28 (×4): qty 100

## 2017-03-28 MED ORDER — CIPROFLOXACIN IN D5W 400 MG/200ML IV SOLN: 400.0000 mg | Freq: Two times a day (BID) | INTRAVENOUS | Status: DC

## 2017-03-28 MED ORDER — HYDROMORPHONE HCL 1 MG/ML IJ SOLN
0.5000 mg | INTRAMUSCULAR | Status: DC | PRN
  Administered 2017-03-28: 0.5 mg via INTRAVENOUS
  Filled 2017-03-28: qty 0.5

## 2017-03-28 NOTE — Progress Notes (Addendum)
Patient reports that left eye is puffy and feels itchy. No redness noted. Applied cool cloth.  Text paged Dr. Starla Link

## 2017-03-28 NOTE — H&P (Signed)
History and Physical    Jacqueline [name] SNK:[id number] DOB: 1991-10-12 DOA: 03/27/2017  Referring MD/NP/PA: Dr. Leonette Monarch PCP: Patient, No Pcp Per  Patient coming from: Home  Chief Complaint: Epigastric pain  HPI: Jacqueline [name] is a 26 y.o. female with medical history significant of endometriosis; who presents with complaints of acute onset of epigastric abdominal pain around 1:30 PM yesterday afternoon. She describes pain as a dull with intermittent squeezing/twisting sensation. Symptoms would cause patient to not be able to take deep inspiratory breath. Patient notes that she had eaten lunch shortly after 12 PM and had the same thing that she had the previous day. Then around 3 PM patient had a loose stool that she reports was normally dark. On 5/15 patient underwent laparoscopic removal of bilateral endometriomas, right hemorrhagic cyst, and right salpingectomy. She admits to use of ibuprofen 800 mg relatively frequently prior to the procedure and immediately following. Denies having similar pain like this in the past, vomiting, fever, chills, or recent antibiotic use.  ED Course: Upon admission into the emergency department patient was seen to be afebrile, heart rate 74-108, and all other vital signs within normal limits. Labs revealed WBC 17.9, platelets 743, creatinine 1.04, and lactic acid 0.49. CT scan of the abdomen revealed enterocolitis. ED physician ordered blood cultures and start the patient empirically on antibiotics of ciprofloxacin and metronidazole. She also received 2 L of normal saline IV fluids and 2 mg of morphine. Patient notes worsening of pain following morphine.   Review of Systems: As per HPI otherwise 10 point review of systems negative.   Past Medical History:  Diagnosis Date  . Allergic rhinitis   . Endometriosis   . Thyroid nodule     Past Surgical History:  Procedure Laterality Date  . CHROMOPERTUBATION Bilateral 03/20/2017   Procedure: CHROMOPERTUBATION;  Surgeon:  Megan Salon, MD;  Location: [facility];  Service: Gynecology;  Laterality: Bilateral;  Fallopian tubes  . CYSTOSCOPY N/A 03/20/2017   Procedure: CYSTOSCOPY;  Surgeon: Megan Salon, MD;  Location: [facility];  Service: Gynecology;  Laterality: N/A;  . FOOT SURGERY Left    neuromona  . LAPAROSCOPIC OVARIAN CYSTECTOMY Bilateral 03/20/2017   Procedure: LAPAROSCOPIC OVARIAN CYSTECTOMY;  Surgeon: Megan Salon, MD;  Location: [facility];  Service: Gynecology;  Laterality: Bilateral;  . LAPAROSCOPIC UNILATERAL SALPINGECTOMY Right 03/20/2017   Procedure: LAPAROSCOPIC UNILATERAL SALPINGECTOMY;  Surgeon: Megan Salon, MD;  Location: [facility];  Service: Gynecology;  Laterality: Right;  . LAPAROSCOPY Bilateral 03/20/2017   Procedure: LAPAROSCOPY OPERATIVE  WITH LYSIS OF ADHESIONS;  Surgeon: Megan Salon, MD;  Location: [facility];  Service: Gynecology;  Laterality: Bilateral;  . WISDOM TOOTH EXTRACTION       reports that she has never smoked. She has never used smokeless tobacco. She reports that she drinks about 0.6 - 1.8 oz of alcohol per week . She reports that she does not use drugs.  Allergies  Allergen Reactions  . Shellfish Allergy Anaphylaxis  . Amoxicillin-Pot Clavulanate Other (See Comments)    Does not remember    Family History  Problem Relation Age of Onset  . Breast cancer Mother 6        mastectomy  . Thyroid disease Neg Hx     Prior to Admission medications   Medication Sig Start Date End Date Taking? Authorizing Provider  ibuprofen (ADVIL,MOTRIN) 200 MG tablet Take 800 mg by mouth every 8 (eight) hours as needed for moderate pain.   Yes [provider]  loratadine (CLARITIN) 10 MG tablet Take 10 mg by mouth as needed for allergies.   Yes [provider]  etonogestrel-ethinyl estradiol (NUVARING) 0.12-0.015 MG/24HR vaginal ring Insert a new ring vaginally every 3 weeks for continuous use of NuvaRing. Patient not taking: Reported on 03/27/2017 02/14/17   Regina Eck,  CNM  ibuprofen (ADVIL,MOTRIN) 800 MG tablet Take 1 tablet (800 mg total) by mouth every 8 (eight) hours as needed. 03/20/17   Megan Salon, MD    Physical Exam:    Constitutional: Young female who appears to be in mild distress Vitals:   03/27/17 2230 03/27/17 2300 03/27/17 2330 03/28/17 0038  BP: 110/83 108/74 120/89 117/86  Pulse: 92 85 78 74  Resp:  18 17 18   Temp:      TempSrc:      SpO2: 100% 97% 99% 98%  Weight:      Height:       Eyes: PERRL, lids and conjunctivae normal ENMT: Mucous membranes are Dry. Posterior pharynx clear of any exudate or lesions. Normal dentition.  Neck: normal, supple, no masses, no thyromegaly Respiratory: clear to auscultation bilaterally, no wheezing, no crackles. Normal respiratory effort. No accessory muscle use.  Cardiovascular: Regular rate and rhythm, no murmurs / rubs / gallops. No extremity edema. 2+ pedal pulses. No carotid bruits.  Abdomen: Generalized tenderness to palpation, no masses palpated. No hepatosplenomegaly. Bowel sounds positive.  Musculoskeletal: no clubbing / cyanosis. No joint deformity upper and lower extremities. Good ROM, no contractures. Normal muscle tone.  Skin: Previous surgical scars appear to be healing well with out signs of erythema or swelling. Neurologic: CN 2-12 grossly intact. Sensation intact, DTR normal. Strength 5/5 in all 4.  Psychiatric: Normal judgment and insight. Alert and oriented x 3. Normal mood.     Labs on Admission: I have personally reviewed following labs and imaging studies  CBC:  Recent Labs Lab 03/27/17 2112  WBC 17.9*  HGB 13.2  HCT 39.2  MCV 89.1  PLT 176*   Basic Metabolic Panel:  Recent Labs Lab 03/27/17 2112  NA 138  K 4.0  CL 103  CO2 26  GLUCOSE 112*  BUN 16  CREATININE 1.04*  CALCIUM 9.1   GFR: Estimated Creatinine Clearance: 64.3 mL/min (A) (by C-G formula based on SCr of 1.04 mg/dL (H)). Liver Function Tests:  Recent Labs Lab 03/27/17 2112  AST 16    ALT 14  ALKPHOS 63  BILITOT 0.5  PROT 8.0  ALBUMIN 3.7    Recent Labs Lab 03/27/17 2112  LIPASE 17   No results for input(s): AMMONIA in the last 168 hours. Coagulation Profile: No results for input(s): INR, PROTIME in the last 168 hours. Cardiac Enzymes: No results for input(s): CKTOTAL, CKMB, CKMBINDEX, TROPONINI in the last 168 hours. BNP (last 3 results) No results for input(s): PROBNP in the last 8760 hours. HbA1C: No results for input(s): HGBA1C in the last 72 hours. CBG: No results for input(s): GLUCAP in the last 168 hours. Lipid Profile: No results for input(s): CHOL, HDL, LDLCALC, TRIG, CHOLHDL, LDLDIRECT in the last 72 hours. Thyroid Function Tests: No results for input(s): TSH, T4TOTAL, FREET4, T3FREE, THYROIDAB in the last 72 hours. Anemia Panel: No results for input(s): VITAMINB12, FOLATE, FERRITIN, TIBC, IRON, RETICCTPCT in the last 72 hours. Urine analysis:    Component Value Date/Time   COLORURINE YELLOW 03/27/2017 2044   APPEARANCEUR CLEAR 03/27/2017 2044   LABSPEC 1.024 03/27/2017 2044   PHURINE 5.0 03/27/2017 2044   GLUCOSEU  NEGATIVE 03/27/2017 2044   HGBUR MODERATE (A) 03/27/2017 2044   BILIRUBINUR NEGATIVE 03/27/2017 2044   BILIRUBINUR n 06/09/2016 1258   KETONESUR 5 (A) 03/27/2017 2044   PROTEINUR 30 (A) 03/27/2017 2044   UROBILINOGEN negative 06/09/2016 1258   NITRITE NEGATIVE 03/27/2017 2044   LEUKOCYTESUR TRACE (A) 03/27/2017 2044   Sepsis Labs: No results found for this or any previous visit (from the past 240 hour(s)).   Radiological Exams on Admission: Ct Abdomen Pelvis W Contrast  Addendum Date: 03/27/2017   ADDENDUM REPORT: 03/27/2017 22:45 ADDENDUM: Acute findings discussed with and reconfirmed by Dr.PEDRO CARDAMA on 03/27/2017 at 10:40 pm. Electronically Signed   By: Elon Alas M.D.   On: 03/27/2017 22:45   Result Date: 03/27/2017 CLINICAL DATA:  [place] squeezing upper mid abdominal pain beginning at 1330 hours today.  History of endometriomas, status post laparoscopic ovarian cystectomy and lysis of adhesions Mar 20, 2017. EXAM: CT ABDOMEN AND PELVIS WITH CONTRAST TECHNIQUE: Multidetector CT imaging of the abdomen and pelvis was performed using the standard protocol following bolus administration of intravenous contrast. CONTRAST:  118mL ISOVUE-300 IOPAMIDOL (ISOVUE-300) INJECTION 61% COMPARISON:  CT abdomen and pelvis Mar 13, 2017 FINDINGS: LOWER CHEST: Lung bases are clear. Included heart size is normal. No pericardial effusion. HEPATOBILIARY: Liver and gallbladder are normal. PANCREAS: Normal. SPLEEN: Normal. ADRENALS/URINARY TRACT: Kidneys are orthotopic, demonstrating symmetric enhancement. No nephrolithiasis, hydronephrosis or solid renal masses. Too small to characterize hypodensities bilateral kidneys. The unopacified ureters are normal in course and caliber. Urinary bladder is partially distended and unremarkable. Normal adrenal glands. STOMACH/BOWEL: Fluid within the small and large bowel with short segment of ileal circumferential wall thickening and edema. Mural small and large bowel hyperemia with air fluid levels. VASCULAR/LYMPHATIC: Aortoiliac vessels are normal in course and caliber. Somewhat flattened inferior vena cava. No lymphadenopathy by CT size criteria. REPRODUCTIVE: 2.5 x 3.9 cm low-density RIGHT adnexa. OTHER: Moderate amount of ascites. No intraperitoneal free air focal fluid collection. MUSCULOSKELETAL: Nonacute. Grade 2 L5-S1 anterolisthesis, chronic segmental L5 pars interarticularis defects, severe L5-S1 neural foraminal narrowing. IMPRESSION: Severe enterocolitis, with short segment of severe ileal wall thickening, differential diagnosis includes shock bowel, infectious or inflammatory etiology less likely ischemic bowel considering distribution though not excluded. Moderate amount of ascites. 2.5 x 3.9 cm RIGHT adnexal residual endometrioma versus benign-appearing cyst. Electronically Signed: By:  Elon Alas M.D. On: 03/27/2017 22:39    EKG: Independently reviewed. Sinus tachycardia with prolonged QTC of 545  Assessment/Plan Enterocolitis: Acute. Patient presents with acute onset of epigastric abdominal pain. CT scan of the abdomen shows severe enterocolitis. Question shock bowel vs. Inflammatory vs. infectious. Patient notes around-the-clock use of ibuprofen initially before and following the laparoscopic surgical procedure on 5/15. Question if patient having a NSAID induced colitis.  - Admitted to a MedSurg bed - NPO, will need advance diet as tolerated - Empiric antibiotics of ciprofloxacin and metronidazole, but held repeat dosing of ciprofloxacin due to QTC - Reassess in a.m. and determine if ciprofloxacin needs to be continued - IV fluids of normal saline at 100 ml/hour  - IV hydromorphone when necessary pain  Leukocytosis: WBC 17.9 on admission. Lactic acid reassured 0.49. - Follow up blood cultures  Thrombocytosis: Acute. Platelet count elevated at 743. This is likely reactive in nature. - Follow-up repeat CBC in a.m.  Prolonged QTC: Initial EKG shows QTC of 45 - Recheck EKG in a.m.  - avoid QT prolonging medications  History of endometriosis: Patient status post laparoscopic removal of endometriomas on  5/15 by Dr. Sabra Heck of gynecology.   DVT prophylaxis: Lovenox   Code Status: Full Family Communication: Discuss plan of care with the patient and family present bedside  Disposition Plan: Possible discharge home his symptoms improve  Consults called: none  Admission status: Observation  Norval Morton MD [facility] Pager [phone]  If 7PM-7AM, please contact night-coverage [email or web address] Password [facility]  03/28/2017, 12:49 AM

## 2017-03-28 NOTE — Progress Notes (Signed)
Patient requesting oral pain medications, notified Dr. Starla Link new orders noted and received.

## 2017-03-28 NOTE — Progress Notes (Signed)
Patient ID: Jacqueline [name], female   DOB: [birth year], 26 y.o.   MRN: [id number]  PROGRESS NOTE    Jacqueline [name]  XBJ:[id number] DOB: 1991-03-19 DOA: 03/27/2017 PCP: Patient, No Pcp Per   Brief Narrative:  26 y.o. female with medical history significant of endometriosis status post  laparoscopic removal of bilateral endometriomas, right hemorrhagic cyst, and right salpingectomy on 03/20/17; who presented with complaints of acute onset of epigastric abdominal pain. She was started on iv Ciprofloxacin and flagyl for acute enterocolitis. Cipro was held since then due to prolonged QTc.   Assessment & Plan:   Principal Problem:   Enterocolitis Active Problems:   Endometriosis   Leukocytosis   Thrombocytosis (HCC)   Prolonged Q-T interval on ECG   Enterocolitis: Acute.  - CT scan of the abdomen shows severe enterocolitis. Question shock bowel vs. Inflammatory vs. infectious. Patient notes around-the-clock use of ibuprofen initially before and following the laparoscopic surgical procedure on 5/15. Question if patient having a NSAID induced colitis.  - continue Flagyl. Add Rocephin; avoid Cipro due to QTc prolongation on presentation. Start soft diet. - decrease iv fluids of normal saline to 38ml/hour  - pain management - probable outpatient evaluation by Gastroenterology   Leukocytosis: improving - Follow up blood cultures; repeat CBC in AM  Thrombocytosis: Acute.  -improving. This is likely reactive in nature. - Follow-up repeat CBC in a.m.  Prolonged QTC: Repeat EKG reviewed: improved QTc at 437. Might need outpatient evaluation by Cardiology. - avoid QT prolonging medications  History of endometriosis: Patient status post laparoscopic removal of endometriomas on 5/15 by Dr. Sabra Heck of gynecology. Outpatient follow up with Dr. Sabra Heck   DVT prophylaxis: Lovenox   Code Status: Full Family Communication: Discussed plan of care with the patient and husband present at bedside  Disposition  Plan: Possible discharge home tomorrow if symptoms improve  Consultants: none  Procedures: None Antimicrobials: Flagyl 03/27/17>> Cipro 03/27/17: 1 dose   Subjective: Patient seen and examined at bedside. She feels better but still complains of abdominal pain and some nausea. No vomiting.  Objective: Vitals:   03/28/17 0038 03/28/17 0130 03/28/17 0221 03/28/17 0622  BP: 117/86 121/87 121/82 93/63  Pulse: 74 85 73 83  Resp: 18 16 18 20   Temp:   97.7 F (36.5 C) 98.8 F (37.1 C)  TempSrc:   Oral Oral  SpO2: 98% 100% 100% 100%  Weight:   58.6 kg (129 lb 3 oz)   Height:   5\' 2"  (1.575 m)     Intake/Output Summary (Last 24 hours) at 03/28/17 0959 Last data filed at 03/28/17 0600  Gross per 24 hour  Intake          1601.67 ml  Output                0 ml  Net          1601.67 ml   Filed Weights   03/27/17 2021 03/28/17 0221  Weight: 56 kg (123 lb 9 oz) 58.6 kg (129 lb 3 oz)    Examination:  General exam: Appears calm and comfortable  Respiratory system: Bilateral decreased breath sound at bases Cardiovascular system: S1 & S2 heard, RRR.   Gastrointestinal system: Abdomen is nondistended, soft. Mild lower quadrant tenderness. No rebound tenderness. Normal bowel sounds heard. Extremities: No cyanosis, clubbing, edema    Data Reviewed: I have personally reviewed following labs and imaging studies  CBC:  Recent Labs Lab 03/27/17 2112 03/28/17 0638  WBC 17.9* 11.4*  HGB  13.2 10.9*  HCT 39.2 32.7*  MCV 89.1 90.6  PLT 743* 814*   Basic Metabolic Panel:  Recent Labs Lab 03/27/17 2112 03/28/17 0638  NA 138 138  K 4.0 3.6  CL 103 108  CO2 26 25  GLUCOSE 112* 97  BUN 16 11  CREATININE 1.04* 0.89  CALCIUM 9.1 7.6*   GFR: Estimated Creatinine Clearance: 75.1 mL/min (by C-G formula based on SCr of 0.89 mg/dL). Liver Function Tests:  Recent Labs Lab 03/27/17 2112  AST 16  ALT 14  ALKPHOS 63  BILITOT 0.5  PROT 8.0  ALBUMIN 3.7    Recent Labs Lab  03/27/17 2112  LIPASE 17   No results for input(s): AMMONIA in the last 168 hours. Coagulation Profile: No results for input(s): INR, PROTIME in the last 168 hours. Cardiac Enzymes: No results for input(s): CKTOTAL, CKMB, CKMBINDEX, TROPONINI in the last 168 hours. BNP (last 3 results) No results for input(s): PROBNP in the last 8760 hours. HbA1C: No results for input(s): HGBA1C in the last 72 hours. CBG: No results for input(s): GLUCAP in the last 168 hours. Lipid Profile: No results for input(s): CHOL, HDL, LDLCALC, TRIG, CHOLHDL, LDLDIRECT in the last 72 hours. Thyroid Function Tests: No results for input(s): TSH, T4TOTAL, FREET4, T3FREE, THYROIDAB in the last 72 hours. Anemia Panel: No results for input(s): VITAMINB12, FOLATE, FERRITIN, TIBC, IRON, RETICCTPCT in the last 72 hours. Sepsis Labs:  Recent Labs Lab 03/28/17 0004  LATICACIDVEN 0.46*    No results found for this or any previous visit (from the past 240 hour(s)).       Radiology Studies: Ct Abdomen Pelvis W Contrast  Addendum Date: 03/27/2017   ADDENDUM REPORT: 03/27/2017 22:45 ADDENDUM: Acute findings discussed with and reconfirmed by Dr.PEDRO CARDAMA on 03/27/2017 at 10:40 pm. Electronically Signed   By: Elon Alas M.D.   On: 03/27/2017 22:45   Result Date: 03/27/2017 CLINICAL DATA:  [place] squeezing upper mid abdominal pain beginning at 1330 hours today. History of endometriomas, status post laparoscopic ovarian cystectomy and lysis of adhesions Mar 20, 2017. EXAM: CT ABDOMEN AND PELVIS WITH CONTRAST TECHNIQUE: Multidetector CT imaging of the abdomen and pelvis was performed using the standard protocol following bolus administration of intravenous contrast. CONTRAST:  166mL ISOVUE-300 IOPAMIDOL (ISOVUE-300) INJECTION 61% COMPARISON:  CT abdomen and pelvis Mar 13, 2017 FINDINGS: LOWER CHEST: Lung bases are clear. Included heart size is normal. No pericardial effusion. HEPATOBILIARY: Liver and gallbladder are  normal. PANCREAS: Normal. SPLEEN: Normal. ADRENALS/URINARY TRACT: Kidneys are orthotopic, demonstrating symmetric enhancement. No nephrolithiasis, hydronephrosis or solid renal masses. Too small to characterize hypodensities bilateral kidneys. The unopacified ureters are normal in course and caliber. Urinary bladder is partially distended and unremarkable. Normal adrenal glands. STOMACH/BOWEL: Fluid within the small and large bowel with short segment of ileal circumferential wall thickening and edema. Mural small and large bowel hyperemia with air fluid levels. VASCULAR/LYMPHATIC: Aortoiliac vessels are normal in course and caliber. Somewhat flattened inferior vena cava. No lymphadenopathy by CT size criteria. REPRODUCTIVE: 2.5 x 3.9 cm low-density RIGHT adnexa. OTHER: Moderate amount of ascites. No intraperitoneal free air focal fluid collection. MUSCULOSKELETAL: Nonacute. Grade 2 L5-S1 anterolisthesis, chronic segmental L5 pars interarticularis defects, severe L5-S1 neural foraminal narrowing. IMPRESSION: Severe enterocolitis, with short segment of severe ileal wall thickening, differential diagnosis includes shock bowel, infectious or inflammatory etiology less likely ischemic bowel considering distribution though not excluded. Moderate amount of ascites. 2.5 x 3.9 cm RIGHT adnexal residual endometrioma versus benign-appearing cyst. Electronically Signed: By:  Elon Alas M.D. On: 03/27/2017 22:39        Scheduled Meds: . enoxaparin (LOVENOX) injection  40 mg Subcutaneous Q24H  . iopamidol       Continuous Infusions: . sodium chloride 100 mL/hr at 03/28/17 0159  . cefTRIAXone (ROCEPHIN)  IV    . metronidazole Stopped (03/28/17 7169)     LOS: 0 days        Aline August, MD [facility] Pager [phone]  If 7PM-7AM, please contact night-coverage [email or web address] Password [email or web address] 03/28/2017, 9:59 AM

## 2017-03-29 ENCOUNTER — Other Ambulatory Visit: Payer: BLUE CROSS/BLUE SHIELD

## 2017-03-29 ENCOUNTER — Other Ambulatory Visit: Payer: BLUE CROSS/BLUE SHIELD | Admitting: Obstetrics & Gynecology

## 2017-03-29 DIAGNOSIS — K529 Noninfective gastroenteritis and colitis, unspecified: Secondary | ICD-10-CM | POA: Diagnosis not present

## 2017-03-29 LAB — CBC WITH DIFFERENTIAL/PLATELET
Basophils Absolute: 0 10*3/uL (ref 0.0–0.1)
Basophils Relative: 0 %
Eosinophils Absolute: 0.7 10*3/uL (ref 0.0–0.7)
Eosinophils Relative: 7 %
HCT: 34.7 % — ABNORMAL LOW (ref 36.0–46.0)
Hemoglobin: 11.3 g/dL — ABNORMAL LOW (ref 12.0–15.0)
Lymphocytes Relative: 16 %
Lymphs Abs: 1.6 10*3/uL (ref 0.7–4.0)
MCH: 29.7 pg (ref 26.0–34.0)
MCHC: 32.6 g/dL (ref 30.0–36.0)
MCV: 91.1 fL (ref 78.0–100.0)
Monocytes Absolute: 0.7 10*3/uL (ref 0.1–1.0)
Monocytes Relative: 7 %
Neutro Abs: 6.9 10*3/uL (ref 1.7–7.7)
Neutrophils Relative %: 70 %
Platelets: 569 10*3/uL — ABNORMAL HIGH (ref 150–400)
RBC: 3.81 MIL/uL — ABNORMAL LOW (ref 3.87–5.11)
RDW: 12.4 % (ref 11.5–15.5)
WBC: 9.9 10*3/uL (ref 4.0–10.5)

## 2017-03-29 LAB — COMPREHENSIVE METABOLIC PANEL
ALT: 9 U/L — ABNORMAL LOW (ref 14–54)
AST: 14 U/L — ABNORMAL LOW (ref 15–41)
Albumin: 2.6 g/dL — ABNORMAL LOW (ref 3.5–5.0)
Alkaline Phosphatase: 46 U/L (ref 38–126)
Anion gap: 4 — ABNORMAL LOW (ref 5–15)
BUN: 6 mg/dL (ref 6–20)
CO2: 27 mmol/L (ref 22–32)
Calcium: 8 mg/dL — ABNORMAL LOW (ref 8.9–10.3)
Chloride: 106 mmol/L (ref 101–111)
Creatinine, Ser: 0.94 mg/dL (ref 0.44–1.00)
GFR calc Af Amer: >60 mL/min (ref >60)
GFR calc non Af Amer: >60 mL/min (ref >60)
Glucose, Bld: 102 mg/dL — ABNORMAL HIGH (ref 65–99)
Potassium: 4 mmol/L (ref 3.5–5.1)
Sodium: 137 mmol/L (ref 135–145)
Total Bilirubin: 0.4 mg/dL (ref 0.3–1.2)
Total Protein: 5.7 g/dL — ABNORMAL LOW (ref 6.5–8.1)

## 2017-03-29 LAB — MAGNESIUM: Magnesium: 1.8 mg/dL (ref 1.7–2.4)

## 2017-03-29 MED ORDER — CEFUROXIME AXETIL 500 MG PO TABS: 500.0000 mg | ORAL_TABLET | Freq: Two times a day (BID) | ORAL | 0 refills | Status: AC

## 2017-03-29 MED ORDER — METRONIDAZOLE 500 MG PO TABS: 500.0000 mg | ORAL_TABLET | Freq: Three times a day (TID) | ORAL | 0 refills | Status: AC

## 2017-03-29 MED ORDER — HYDROMORPHONE HCL 2 MG PO TABS
2.0000 mg | ORAL_TABLET | Freq: Once | ORAL | Status: AC
  Administered 2017-03-29: 2 mg via ORAL
  Filled 2017-03-29: qty 1

## 2017-03-29 MED ORDER — HYDROCODONE-ACETAMINOPHEN 5-325 MG PO TABS: 1.0000 | ORAL_TABLET | Freq: Four times a day (QID) | ORAL | 0 refills | Status: DC | PRN

## 2017-03-29 NOTE — Discharge Summary (Signed)
Physician Discharge Summary  Jacqueline [name] YBO:[id number] DOB: 1991-02-25 DOA: 03/27/2017  PCP: Patient, No Pcp Per  Admit date: 03/27/2017 Discharge date: 03/29/2017  Admitted From: Home Disposition:  Home  Recommendations for Outpatient Follow-up:  1. Follow up with PCP in 1-2 weeks 2. Follow-up with gastroenterology as an outpatient if symptoms persist or worsen. 3. Follow-up with Dr. Sabra Heck from gynecology as scheduled  Home Health: No  Equipment/Devices: None  Discharge Condition: Stable CODE STATUS: Full  Diet recommendation: Regular  Brief/Interim Summary: 26 y.o.femalewith medical history significant of endometriosis status post  laparoscopic removal of bilateral endometriomas, right hemorrhagic cyst,and right salpingectomy on 03/20/17; who presented with complaints of acute onset of epigastric abdominal pain. She was started on iv Ciprofloxacin and flagyl for acute enterocolitis. Cipro was held since then due to prolonged QTc. QTc improved. She was switched to IV Rocephin. Her symptoms have improved. She is still a having some pain. She'll be discharged on oral Cefuroxime and Flagyl for Another 10 Days. She Might Need Outpatient Gastroenterology Evaluation.  Discharge Diagnoses:  Principal Problem:   Enterocolitis Active Problems:   Endometriosis   Leukocytosis   Thrombocytosis (HCC)   Prolonged Q-T interval on ECG  Enterocolitis: Acute.  - CT scan of the abdomen showed severe enterocolitis. Question shock bowel vs. Inflammatory vs.infectious. Patient notes around-the-clock use of ibuprofen initially before andfollowing the laparoscopic surgical procedure on 5/15. Question if patient having a NSAID induced colitis.  - Currently on IV Flagyl and Rocephin. Cipro was held because of QTC prolongation. Discharge patient home on oral Cefuroxime and Flagyl for Another 10 Days. She Might Need Outpatient Gastroenterology Evaluation if symptoms  worsen/persist.   Leukocytosis:Resolved   Thrombocytosis: Acute.  - likely reactive in nature. -Outpatient follow-up   Prolonged QTC: Repeat EKG reviewed: improved QTc at 437. Might need outpatient evaluation by Cardiology. - avoid QT prolonging medications  History of endometriosis: Patient status post laparoscopic removal of endometriomas on 5/15 by Dr. Sabra Heck of gynecology. Outpatient follow up with Dr. Sabra Heck   Discharge Instructions  Discharge Instructions    Call MD for:  difficulty breathing, headache or visual disturbances    Complete by:  As directed    Call MD for:  hives    Complete by:  As directed    Call MD for:  persistant nausea and vomiting    Complete by:  As directed    Call MD for:  severe uncontrolled pain    Complete by:  As directed    Call MD for:  temperature >100.4    Complete by:  As directed    Diet general    Complete by:  As directed    Increase activity slowly    Complete by:  As directed      Allergies as of 03/29/2017      Reactions   Shellfish Allergy Anaphylaxis   Amoxicillin-pot Clavulanate Other (See Comments)   Does not remember      Medication List    TAKE these medications   cefUROXime 500 MG tablet Commonly known as:  CEFTIN Take 1 tablet (500 mg total) by mouth 2 (two) times daily.   etonogestrel-ethinyl estradiol 0.12-0.015 MG/24HR vaginal ring Commonly known as:  NUVARING Insert a new ring vaginally every 3 weeks for continuous use of NuvaRing.   HYDROcodone-acetaminophen 5-325 MG tablet Commonly known as:  NORCO/VICODIN Take 1 tablet by mouth every 6 (six) hours as needed for moderate pain or severe pain.   ibuprofen 200 MG tablet Commonly known  as:  ADVIL,MOTRIN Take 800 mg by mouth every 8 (eight) hours as needed for moderate pain. What changed:  Another medication with the same name was removed. Continue taking this medication, and follow the directions you see here.   loratadine 10 MG tablet Commonly  known as:  CLARITIN Take 10 mg by mouth as needed for allergies.   metroNIDAZOLE 500 MG tablet Commonly known as:  FLAGYL Take 1 tablet (500 mg total) by mouth 3 (three) times daily.      Follow-up Information    Megan Salon, MD Follow up in 1 week(s).   Specialty:  Gynecology Contact information: [place] [address] [phone]          Allergies  Allergen Reactions  . Shellfish Allergy Anaphylaxis  . Amoxicillin-Pot Clavulanate Other (See Comments)    Does not remember    Consultations: None  Procedures/Studies: US Transvaginal Non-ob  Result Date: 03/13/2017 CLINICAL DATA:  Midline pelvic pain.  History of endometriosis. G1 P0, LMP 03/11/2017 EXAM: TRANSABDOMINAL AND TRANSVAGINAL ULTRASOUND OF PELVIS DOPPLER ULTRASOUND OF OVARIES TECHNIQUE: Both transabdominal and transvaginal ultrasound examinations of the pelvis were performed. Transabdominal technique was performed for global imaging of the pelvis including uterus, ovaries, adnexal regions, and pelvic cul-de-sac. It was necessary to proceed with endovaginal exam following the transabdominal exam to visualize the endometrium and ovaries. Color and duplex Doppler ultrasound was utilized to evaluate blood flow to the ovaries. COMPARISON:  CT abdomen pelvis 03/13/2017 Pelvic ultrasound 09/26/2016 FINDINGS: Uterus Measurements: 6.8 x 4.0 x 3.9 cm. No fibroids or other mass visualized. Endometrium Thickness: 3.8 mm, normal.  No focal abnormality visualized. Right ovary Measurements: 8.4 x 5.8 x 5.4 cm. There is a complex area in the right adnexa with homogeneous low-level internal echoes, measuring 7.1 x 3.6 x 4.8 cm, previously 6.0 x 4.1 x 5.6 cm. There is a smaller area with similar characteristics measuring 2.4 x 1.5 x 2.2 cm, previously 4.0 x 2.2 x 2.5 cm. Left ovary Measurements: 7.6 x 4.4 x 4.0 cm. There is a complex lesion in the left adnexa that measures 6.7 x 3.2 x 3.5 cm, previously 3.3 x  2.9 x 3.0 cm Pulsed Doppler evaluation of both ovaries demonstrates normal low-resistance arterial and venous waveforms. Other findings Small amount free fluid in the pelvis, which may be physiologic. IMPRESSION: 1. Bilateral adnexal lesions with homogeneous internal low level echoes, most consistent with endometriomas. The largest lesions have increased in size from the prior study. A nonemergent pelvic MRI may be helpful for following these lesions over time, as that study would provide the best characterization. 2. No ovarian torsion. 3. Normal appearance of the uterus. Electronically Signed   By: Ulyses Jarred M.D.   On: 03/13/2017 16:46   US Pelvis Complete  Result Date: 03/13/2017 CLINICAL DATA:  Midline pelvic pain.  History of endometriosis. G1 P0, LMP 03/11/2017 EXAM: TRANSABDOMINAL AND TRANSVAGINAL ULTRASOUND OF PELVIS DOPPLER ULTRASOUND OF OVARIES TECHNIQUE: Both transabdominal and transvaginal ultrasound examinations of the pelvis were performed. Transabdominal technique was performed for global imaging of the pelvis including uterus, ovaries, adnexal regions, and pelvic cul-de-sac. It was necessary to proceed with endovaginal exam following the transabdominal exam to visualize the endometrium and ovaries. Color and duplex Doppler ultrasound was utilized to evaluate blood flow to the ovaries. COMPARISON:  CT abdomen pelvis 03/13/2017 Pelvic ultrasound 09/26/2016 FINDINGS: Uterus Measurements: 6.8 x 4.0 x 3.9 cm. No fibroids or other mass visualized. Endometrium Thickness: 3.8 mm, normal.  No focal abnormality visualized. Right ovary Measurements: 8.4 x 5.8 x 5.4 cm. There is a complex area in the right adnexa with homogeneous low-level internal echoes, measuring 7.1 x 3.6 x 4.8 cm, previously 6.0 x 4.1 x 5.6 cm. There is a smaller area with similar characteristics measuring 2.4 x 1.5 x 2.2 cm, previously 4.0 x 2.2 x 2.5 cm. Left ovary Measurements: 7.6 x 4.4 x 4.0 cm. There is a complex lesion in the  left adnexa that measures 6.7 x 3.2 x 3.5 cm, previously 3.3 x 2.9 x 3.0 cm Pulsed Doppler evaluation of both ovaries demonstrates normal low-resistance arterial and venous waveforms. Other findings Small amount free fluid in the pelvis, which may be physiologic. IMPRESSION: 1. Bilateral adnexal lesions with homogeneous internal low level echoes, most consistent with endometriomas. The largest lesions have increased in size from the prior study. A nonemergent pelvic MRI may be helpful for following these lesions over time, as that study would provide the best characterization. 2. No ovarian torsion. 3. Normal appearance of the uterus. Electronically Signed   By: Ulyses Jarred M.D.   On: 03/13/2017 16:46   Ct Abdomen Pelvis W Contrast  Addendum Date: 03/27/2017   ADDENDUM REPORT: 03/27/2017 22:45 ADDENDUM: Acute findings discussed with and reconfirmed by Dr.PEDRO CARDAMA on 03/27/2017 at 10:40 pm. Electronically Signed   By: Elon Alas M.D.   On: 03/27/2017 22:45   Result Date: 03/27/2017 CLINICAL DATA:  [place] squeezing upper mid abdominal pain beginning at 1330 hours today. History of endometriomas, status post laparoscopic ovarian cystectomy and lysis of adhesions Mar 20, 2017. EXAM: CT ABDOMEN AND PELVIS WITH CONTRAST TECHNIQUE: Multidetector CT imaging of the abdomen and pelvis was performed using the standard protocol following bolus administration of intravenous contrast. CONTRAST:  153mL ISOVUE-300 IOPAMIDOL (ISOVUE-300) INJECTION 61% COMPARISON:  CT abdomen and pelvis Mar 13, 2017 FINDINGS: LOWER CHEST: Lung bases are clear. Included heart size is normal. No pericardial effusion. HEPATOBILIARY: Liver and gallbladder are normal. PANCREAS: Normal. SPLEEN: Normal. ADRENALS/URINARY TRACT: Kidneys are orthotopic, demonstrating symmetric enhancement. No nephrolithiasis, hydronephrosis or solid renal masses. Too small to characterize hypodensities bilateral kidneys. The unopacified ureters are normal in  course and caliber. Urinary bladder is partially distended and unremarkable. Normal adrenal glands. STOMACH/BOWEL: Fluid within the small and large bowel with short segment of ileal circumferential wall thickening and edema. Mural small and large bowel hyperemia with air fluid levels. VASCULAR/LYMPHATIC: Aortoiliac vessels are normal in course and caliber. Somewhat flattened inferior vena cava. No lymphadenopathy by CT size criteria. REPRODUCTIVE: 2.5 x 3.9 cm low-density RIGHT adnexa. OTHER: Moderate amount of ascites. No intraperitoneal free air focal fluid collection. MUSCULOSKELETAL: Nonacute. Grade 2 L5-S1 anterolisthesis, chronic segmental L5 pars interarticularis defects, severe L5-S1 neural foraminal narrowing. IMPRESSION: Severe enterocolitis, with short segment of severe ileal wall thickening, differential diagnosis includes shock bowel, infectious or inflammatory etiology less likely ischemic bowel considering distribution though not excluded. Moderate amount of ascites. 2.5 x 3.9 cm RIGHT adnexal residual endometrioma versus benign-appearing cyst. Electronically Signed: By: Elon Alas M.D. On: 03/27/2017 22:39   Ct Abdomen Pelvis W Contrast  Result Date: 03/13/2017 CLINICAL DATA:  Low abdominal pain today with fever and leukocytosis. EXAM: CT ABDOMEN AND PELVIS WITH CONTRAST TECHNIQUE: Multidetector CT imaging of the abdomen and pelvis was performed using the standard protocol following bolus administration of intravenous contrast. CONTRAST:  18mL ISOVUE-300 IOPAMIDOL (ISOVUE-300) INJECTION 61% COMPARISON:  Office pelvic ultrasound 09/26/2016. FINDINGS: Lower chest: Clear lung bases. No significant pleural or pericardial  effusion. Hepatobiliary: The liver is normal in density without focal abnormality. No evidence of gallstones, gallbladder wall thickening or biliary dilatation. Pancreas: Unremarkable. No pancreatic ductal dilatation or surrounding inflammatory changes. Spleen: Normal in size  without focal abnormality. Adrenals/Urinary Tract: Both adrenal glands appear normal. Tiny low-density renal lesions bilaterally are too small to characterize, although are probably cysts. Mild asymmetric dilatation of the right renal pelvis and calices. No significant ureterectasis or secondary signs of ureteral obstruction. The bladder appears unremarkable. Stomach/Bowel: No evidence of bowel wall thickening, distention or surrounding inflammatory change. The appendix appears normal. Vascular/Lymphatic: There are no enlarged abdominal or pelvic lymph nodes. No significant vascular findings are present. Reproductive: The uterus is retroverted with ill-defined low-density in the endometrial complex. There are complex bilateral adnexal masses measuring 8.0 x 4.9 x 5.6 cm on the right and 6.7 x 3.6 x 3.7 cm on the left. The left adnexal lesion may reflect 2 adjacent lesions based on previous ultrasound. The bilateral adnexal masses demonstrate thickened walls, internal septations and surrounding soft tissue stranding. They were felt to reflect endometriomas on previous ultrasound. Other: Small amount of pelvic ascites and pelvic soft tissue stranding. No other peritoneal nodularity identified. Musculoskeletal: No acute or worrisome osseous findings. There are bilateral L5 pars defects with a resulting grade 1 anterolisthesis and mild foraminal narrowing bilaterally at L5-S1. IMPRESSION: 1. Enlarging complex bilateral adnexal masses, felt to reflect endometriomas on previous ultrasound. CT appearance is nonspecific. Follow-up pelvic ultrasound recommended. 2. Nonspecific low-density within the endometrium, likely physiologic. 3. Mild right-sided pelvicaliectasis, possibly from mass effect on the distal right ureter by the complex right adnexal mass. No high-grade ureteral obstruction identified. 4. Bilateral L5 pars defects. Electronically Signed   By: Richardean Sale M.D.   On: 03/13/2017 14:33   [place] Art/ven Flow Abd  Pelv Doppler  Result Date: 03/13/2017 CLINICAL DATA:  Midline pelvic pain.  History of endometriosis. G1 P0, LMP 03/11/2017 EXAM: TRANSABDOMINAL AND TRANSVAGINAL ULTRASOUND OF PELVIS DOPPLER ULTRASOUND OF OVARIES TECHNIQUE: Both transabdominal and transvaginal ultrasound examinations of the pelvis were performed. Transabdominal technique was performed for global imaging of the pelvis including uterus, ovaries, adnexal regions, and pelvic cul-de-sac. It was necessary to proceed with endovaginal exam following the transabdominal exam to visualize the endometrium and ovaries. Color and duplex Doppler ultrasound was utilized to evaluate blood flow to the ovaries. COMPARISON:  CT abdomen pelvis 03/13/2017 Pelvic ultrasound 09/26/2016 FINDINGS: Uterus Measurements: 6.8 x 4.0 x 3.9 cm. No fibroids or other mass visualized. Endometrium Thickness: 3.8 mm, normal.  No focal abnormality visualized. Right ovary Measurements: 8.4 x 5.8 x 5.4 cm. There is a complex area in the right adnexa with homogeneous low-level internal echoes, measuring 7.1 x 3.6 x 4.8 cm, previously 6.0 x 4.1 x 5.6 cm. There is a smaller area with similar characteristics measuring 2.4 x 1.5 x 2.2 cm, previously 4.0 x 2.2 x 2.5 cm. Left ovary Measurements: 7.6 x 4.4 x 4.0 cm. There is a complex lesion in the left adnexa that measures 6.7 x 3.2 x 3.5 cm, previously 3.3 x 2.9 x 3.0 cm Pulsed Doppler evaluation of both ovaries demonstrates normal low-resistance arterial and venous waveforms. Other findings Small amount free fluid in the pelvis, which may be physiologic. IMPRESSION: 1. Bilateral adnexal lesions with homogeneous internal low level echoes, most consistent with endometriomas. The largest lesions have increased in size from the prior study. A nonemergent pelvic MRI may be helpful for following these lesions over time, as that study would provide  the best characterization. 2. No ovarian torsion. 3. Normal appearance of the uterus. Electronically  Signed   By: Ulyses Jarred M.D.   On: 03/13/2017 16:46        Subjective: Patient seen and examined at bedside. She still having some abdominal pain but improved. No overnight fever, vomiting, diarrhea  Discharge Exam: Vitals:   03/28/17 2104 03/29/17 0535  BP: 114/80 113/78  Pulse: 98 98  Resp: 20 18  Temp: 97.3 F (36.3 C) 99 F (37.2 C)   Vitals:   03/28/17 0622 03/28/17 1729 03/28/17 2104 03/29/17 0535  BP: 93/63 98/61 114/80 113/78  Pulse: 83 86 98 98  Resp: 20 18 20 18   Temp: 98.8 F (37.1 C) 98.6 F (37 C) 97.3 F (36.3 C) 99 F (37.2 C)  TempSrc: Oral Oral Oral Oral  SpO2: 100% 100% 100% 99%  Weight:      Height:        General: Pt is alert, awake, not in acute distress Cardiovascular: RRR, Rate controlled  Respiratory: CTA bilaterally, no wheezing, no rhonchi Abdominal: Soft, ND, bowel sounds +, mild periumbilical tenderness  Extremities: no edema, no cyanosis    The results of significant diagnostics from this hospitalization (including imaging, microbiology, ancillary and laboratory) are listed below for reference.     Microbiology: No results found for this or any previous visit (from the past 240 hour(s)).   Labs: BNP (last 3 results) No results for input(s): BNP in the last 8760 hours. Basic Metabolic Panel:  Recent Labs Lab 03/27/17 2112 03/28/17 0638 03/29/17 0617  NA 138 138 137  K 4.0 3.6 4.0  CL 103 108 106  CO2 26 25 27   GLUCOSE 112* 97 102*  BUN 16 11 6   CREATININE 1.04* 0.89 0.94  CALCIUM 9.1 7.6* 8.0*  MG  --   --  1.8   Liver Function Tests:  Recent Labs Lab 03/27/17 2112 03/29/17 0617  AST 16 14*  ALT 14 9*  ALKPHOS 63 46  BILITOT 0.5 0.4  PROT 8.0 5.7*  ALBUMIN 3.7 2.6*    Recent Labs Lab 03/27/17 2112  LIPASE 17   No results for input(s): AMMONIA in the last 168 hours. CBC:  Recent Labs Lab 03/27/17 2112 03/28/17 0638 03/29/17 0617  WBC 17.9* 11.4* 9.9  NEUTROABS  --   --  6.9  HGB 13.2 10.9*  11.3*  HCT 39.2 32.7* 34.7*  MCV 89.1 90.6 91.1  PLT 743* 496* 569*   Cardiac Enzymes: No results for input(s): CKTOTAL, CKMB, CKMBINDEX, TROPONINI in the last 168 hours. BNP: Invalid input(s): POCBNP CBG: No results for input(s): GLUCAP in the last 168 hours. D-Dimer No results for input(s): DDIMER in the last 72 hours. Hgb A1c No results for input(s): HGBA1C in the last 72 hours. Lipid Profile No results for input(s): CHOL, HDL, LDLCALC, TRIG, CHOLHDL, LDLDIRECT in the last 72 hours. Thyroid function studies No results for input(s): TSH, T4TOTAL, T3FREE, THYROIDAB in the last 72 hours.  Invalid input(s): FREET3 Anemia work up No results for input(s): VITAMINB12, FOLATE, FERRITIN, TIBC, IRON, RETICCTPCT in the last 72 hours. Urinalysis    Component Value Date/Time   COLORURINE YELLOW 03/27/2017 2044   APPEARANCEUR CLEAR 03/27/2017 2044   LABSPEC 1.024 03/27/2017 2044   PHURINE 5.0 03/27/2017 2044   GLUCOSEU NEGATIVE 03/27/2017 2044   HGBUR MODERATE (A) 03/27/2017 2044   BILIRUBINUR NEGATIVE 03/27/2017 2044   BILIRUBINUR n 06/09/2016 1258   KETONESUR 5 (A) 03/27/2017 2044  PROTEINUR 30 (A) 03/27/2017 2044   UROBILINOGEN negative 06/09/2016 1258   NITRITE NEGATIVE 03/27/2017 2044   LEUKOCYTESUR TRACE (A) 03/27/2017 2044   Sepsis Labs Invalid input(s): PROCALCITONIN,  WBC,  LACTICIDVEN Microbiology No results found for this or any previous visit (from the past 240 hour(s)).   Time coordinating discharge: 30 minutes  SIGNED:   Aline August, MD  [facility] 03/29/2017, 12:34 PM Pager: [phone]  If 7PM-7AM, please contact night-coverage [email or web address] Password [email or web address]

## 2017-03-29 NOTE — Progress Notes (Signed)
Discharged to home via family car. Patient received RX  for Hydrocodone, states " understands discharge instructions.

## 2017-03-29 NOTE — Progress Notes (Signed)
Patient slept through the night waking up during the night by alarm clock to request pain medication. Patients pain increased to a maximum of 4 on 0-10 scale. Patient requesting this morning to be changed back to IV pain medication stating "it works faster" hospitalist coverage notified of patient request.

## 2017-03-29 NOTE — Care Management Note (Signed)
Case Management Note  Patient Details  Name: Jacqueline [name] MRN: [id number] Date of Birth: 08-08-1991  Subjective/Objective:                    Action/Plan:d/c home.   Expected Discharge Date:  03/29/17               Expected Discharge Plan:  Home/Self Care  In-House Referral:     Discharge planning Services  CM Consult  Post Acute Care Choice:    Choice offered to:     DME Arranged:    DME Agency:     HH Arranged:    HH Agency:     Status of Service:  Completed, signed off  If discussed at [employer] of Stay Meetings, dates discussed:    Additional Comments:  Dessa Phi, RN 03/29/2017, 1:08 PM

## 2017-03-29 NOTE — Discharge Instructions (Signed)
Follow up with Gastroenterolgist as an outpatient if symptoms persist or worsen.

## 2017-03-29 NOTE — Progress Notes (Signed)
Patient states that her pain is not controlled with hydrocodone, and requested po Dilaudid. New orders noted

## 2017-04-02 LAB — CULTURE, BLOOD (ROUTINE X 2)
Culture: NO GROWTH
Culture: NO GROWTH
Special Requests: ADEQUATE
Special Requests: ADEQUATE

## 2017-04-05 ENCOUNTER — Telehealth: Payer: Self-pay

## 2017-04-05 NOTE — Telephone Encounter (Signed)
Spoke with patient's mother Jacqueline [name], okay per ROI. Jacqueline [name] is calling to let Dr.Miller know that the patient is taking Flagyl and Ceftin and will be done Sunday. Based on ovulation tracking she is due to ovulate on 04/10/2017. Asking if it is okay for her to try for pregnancy.

## 2017-04-09 ENCOUNTER — Ambulatory Visit (INDEPENDENT_AMBULATORY_CARE_PROVIDER_SITE_OTHER): Payer: BLUE CROSS/BLUE SHIELD | Admitting: Obstetrics & Gynecology

## 2017-04-09 VITALS — BP 98/70 | HR 80 | Resp 14 | Ht 62.0 in | Wt 124.0 lb

## 2017-04-09 DIAGNOSIS — R9431 Abnormal electrocardiogram [ECG] [EKG]: Secondary | ICD-10-CM

## 2017-04-09 DIAGNOSIS — N809 Endometriosis, unspecified: Secondary | ICD-10-CM

## 2017-04-09 DIAGNOSIS — K529 Noninfective gastroenteritis and colitis, unspecified: Principal | ICD-10-CM

## 2017-04-09 NOTE — Telephone Encounter (Signed)
Called pt's mother personally and answered questions.  Encounter closed.

## 2017-04-09 NOTE — Progress Notes (Signed)
GYNECOLOGY  VISIT   HPI: 26 y.o. G72P0010 Married Caucasian female here for post op visit after undergoing extensive surgical procedure for stage IV endometriosis where five endometriomas were removed, the right fallopian tube was removed (with pathology showing endometriosis) and extensive LOA.  Also chromopertubation was performed before the right tube was removed and the left was patent but the right was not.  This and the enlarged, edematous appearance of the tube was the reason for removal.  On POD #7, pt was readmitted to the hospital due to ileitis.  This was treated with two days of IV antibiotics and then transitioned to PO.  She has completed all oral antibiotics.  Reports pain is gone and abdomen is flat again.  Bowel movements do not seem completely back to normal but are much better.  Denies urinary complaints.  D/w pt I did communicate with Dr. Kerin Perna and he recommended she try for a few months and then proceed with timed ovulation and inseminations.  Pt is comfortable with this.  Will need referral appt.  Have decided to proceed with this today for about three months in the future.  She is taking a PNV and she is doing ovulation prediction monitoring.  She will actively try with upcoming cycle.  She is ok with seeing GI as well.  Referral will be made.  Lastly, she did have QT prolongation on EKG when hospitalized.  Outpatient cardiology referral was recommended as well.  This will be made today.  GYNECOLOGIC HISTORY: Patient's last menstrual period was 03/24/2017. Contraception: none  Patient Active Problem List   Diagnosis Date Noted  . Enterocolitis 03/28/2017  . Leukocytosis 03/28/2017  . Thrombocytosis ([place]) 03/28/2017  . Prolonged Q-T interval on ECG 03/28/2017  . Endometriosis 03/20/2017  . 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 Multinodular goiter 05/29/2016  . Allergic rhinitis   . Neck nodule 05/26/2016    Past Medical History:  Diagnosis Date  . Allergic  rhinitis   . Endometriosis   . Thyroid nodule     Past Surgical History:  Procedure Laterality Date  . CHROMOPERTUBATION Bilateral 03/20/2017   Procedure: CHROMOPERTUBATION;  Surgeon: Megan Salon, MD;  Location: [facility];  Service: Gynecology;  Laterality: Bilateral;  Fallopian tubes  . CYSTOSCOPY N/A 03/20/2017   Procedure: CYSTOSCOPY;  Surgeon: Megan Salon, MD;  Location: [facility];  Service: Gynecology;  Laterality: N/A;  . FOOT SURGERY Left    neuromona  . LAPAROSCOPIC OVARIAN CYSTECTOMY Bilateral 03/20/2017   Procedure: LAPAROSCOPIC OVARIAN CYSTECTOMY;  Surgeon: Megan Salon, MD;  Location: [facility];  Service: Gynecology;  Laterality: Bilateral;  . LAPAROSCOPIC UNILATERAL SALPINGECTOMY Right 03/20/2017   Procedure: LAPAROSCOPIC UNILATERAL SALPINGECTOMY;  Surgeon: Megan Salon, MD;  Location: [facility];  Service: Gynecology;  Laterality: Right;  . LAPAROSCOPY Bilateral 03/20/2017   Procedure: LAPAROSCOPY OPERATIVE  WITH LYSIS OF ADHESIONS;  Surgeon: Megan Salon, MD;  Location: [facility];  Service: Gynecology;  Laterality: Bilateral;  . WISDOM TOOTH EXTRACTION      MEDS:  Vitamins  ALLERGIES: Shellfish allergy and Amoxicillin-pot clavulanate  Family History  Problem Relation Age of Onset  . Breast cancer Mother 40        mastectomy  . Thyroid disease Neg Hx     SH:  Married, non smoker  Review of Systems  All other systems reviewed and are negative.   PHYSICAL EXAMINATION:    BP 98/70 (BP Location: Right Arm, Patient Position: Sitting, Cuff Size: Normal)   Pulse 80  Resp 14   Ht 5\' 2"  (1.575 m)   Wt 124 lb (56.2 kg)   LMP 03/24/2017 Comment: Upreg neg 03/27/17  BMI 22.68 kg/m     General appearance: alert, cooperative and appears stated age CV:  Regular rate and rhythm Lungs:  clear to auscultation, no wheezes, rales or rhonchi, symmetric air entry Abdomen: soft, non-tender; bowel sounds normal; no masses,  no organomegaly Inc:  C/D/I  Pelvic: not performed  today  Chaperone was present for exam.  Assessment: Stage IV endometriosis H/O ileitis that occurred one week post-op H/O QT prolongation on ECG while hospitalized.  Outpatient cardiology referral recommended.  Plan: She will continue ovulation monitoring and will try for pregnancy Referral to Dr. Kerin Perna is made for about three months from now. Referral to Dr. Collene Mares is made for additional evaluation of post-op ileitis (which is quite unusual) Referral to cardiology for prolonged QT interval on EKG when hospitalized (improved with cessation of ciprofloxacin)

## 2017-04-11 ENCOUNTER — Encounter: Payer: Self-pay | Admitting: Obstetrics & Gynecology

## 2017-04-18 ENCOUNTER — Telehealth: Payer: Self-pay | Admitting: Obstetrics & Gynecology

## 2017-04-18 NOTE — Telephone Encounter (Signed)
Patient has referrals to cardiology and infertility. Offices report multiple attempts to schedule, but patient remains unscheduled. Patient's voice mail is full and will not accept new messages.  Our referrals department attempted contact with same result.   Routing to provider for review and recommendations.

## 2017-04-19 ENCOUNTER — Ambulatory Visit: Payer: BLUE CROSS/BLUE SHIELD | Admitting: Obstetrics & Gynecology

## 2017-04-19 NOTE — Telephone Encounter (Signed)
I think you should send a letter letting her know we are trying to schedule these and that we cannot lave a message on her phone as her voicemail is full.  Please leave these referrals open.  Thanks.

## 2017-04-30 ENCOUNTER — Encounter: Payer: Self-pay | Admitting: Obstetrics & Gynecology

## 2017-05-02 NOTE — Telephone Encounter (Signed)
Letter mailed on 04-30-17. Will close encounter.

## 2017-05-11 ENCOUNTER — Telehealth: Payer: Self-pay | Admitting: Obstetrics & Gynecology

## 2017-05-11 NOTE — Telephone Encounter (Signed)
Patient called for referral information. Notes she received our letter regarding attempts to schedule. Her voicemail was full.  I provided her with the contact information for [facility] and [facility] to schedule her referrals. Patient agreeable to information. Re-sent a link to sign up for MyChart for enhanced communication.   Will defer referrals to follow up with scheduling.  Routing to Dr. Sabra Heck for review. Will close encounter.

## 2017-05-15 ENCOUNTER — Ambulatory Visit: Payer: BLUE CROSS/BLUE SHIELD | Admitting: Obstetrics & Gynecology

## 2017-05-15 DIAGNOSIS — K5904 Chronic idiopathic constipation: Secondary | ICD-10-CM | POA: Diagnosis not present

## 2017-05-15 DIAGNOSIS — R933 Abnormal findings on diagnostic imaging of other parts of digestive tract: Secondary | ICD-10-CM | POA: Diagnosis not present

## 2017-05-15 DIAGNOSIS — N809 Endometriosis, unspecified: Secondary | ICD-10-CM | POA: Diagnosis not present

## 2017-05-18 ENCOUNTER — Telehealth: Payer: Self-pay | Admitting: Obstetrics & Gynecology

## 2017-05-18 NOTE — Telephone Encounter (Signed)
Spoke with scheduler at Dr. Charlett Lango office. Patient is scheduled for a consultation on 06-27-17. Per her appointment desk, she remains unscheduled with [facility].  Forwarding to Dr. Sabra Heck for review and advise any additional actions regarding Cardiology referral.

## 2017-05-18 NOTE — Telephone Encounter (Signed)
Please call pt and remind of need for cardiology appt as well.  She did see Dr. Collene Mares this week so she is doing the recommended follow-up after her surgery.  Thanks.

## 2017-05-18 NOTE — Telephone Encounter (Signed)
error 

## 2017-05-27 ENCOUNTER — Other Ambulatory Visit: Payer: Self-pay | Admitting: Endocrinology

## 2017-05-27 DIAGNOSIS — E042 Nontoxic multinodular goiter: Principal | ICD-10-CM

## 2017-05-31 DIAGNOSIS — E288 Other ovarian dysfunction: Secondary | ICD-10-CM | POA: Diagnosis not present

## 2017-05-31 DIAGNOSIS — Z3161 Procreative counseling and advice using natural family planning: Secondary | ICD-10-CM | POA: Diagnosis not present

## 2017-05-31 DIAGNOSIS — N803 Endometriosis of pelvic peritoneum: Secondary | ICD-10-CM | POA: Diagnosis not present

## 2017-05-31 DIAGNOSIS — N801 Endometriosis of ovary: Secondary | ICD-10-CM | POA: Diagnosis not present

## 2017-05-31 DIAGNOSIS — Z31430 Encounter of female for testing for genetic disease carrier status for procreative management: Secondary | ICD-10-CM | POA: Diagnosis not present

## 2017-06-02 DIAGNOSIS — Z319 Encounter for procreative management, unspecified: Secondary | ICD-10-CM | POA: Diagnosis not present

## 2017-07-06 DIAGNOSIS — Z319 Encounter for procreative management, unspecified: Secondary | ICD-10-CM | POA: Diagnosis not present

## 2017-07-11 DIAGNOSIS — Z3189 Encounter for other procreative management: Secondary | ICD-10-CM | POA: Diagnosis not present

## 2017-08-07 DIAGNOSIS — Z319 Encounter for procreative management, unspecified: Secondary | ICD-10-CM | POA: Diagnosis not present

## 2017-08-10 ENCOUNTER — Ambulatory Visit: Payer: BLUE CROSS/BLUE SHIELD | Admitting: Cardiovascular Disease

## 2017-08-10 DIAGNOSIS — Z3189 Encounter for other procreative management: Secondary | ICD-10-CM | POA: Diagnosis not present

## 2017-08-15 ENCOUNTER — Telehealth: Payer: Self-pay | Admitting: Obstetrics & Gynecology

## 2017-08-15 NOTE — Telephone Encounter (Signed)
Patient returning your call.

## 2017-08-15 NOTE — Telephone Encounter (Signed)
I'm not sure if she is also using ovarian stimulation medications like Clomid or Femara with the inseminations as these very commonly cause bloating and discomfort from ovarian stimulation.  Please give pt precautions to be seen with any SOB or fever.  OK to monitor.  On another topic, I received a message yesterday from the cardiologist that Jacqueline [name] never went for follow-up.  The referral has been close but I can place it again if she will actually schedule the appt.  Please ask her.  Thanks.

## 2017-08-15 NOTE — Telephone Encounter (Signed)
Spoke with patient. States she had IUI on 10/5 done by Dr. Kerin Perna. Is experiencing abdominal bloating and discomfort, has placed a call to Dr. Kerin Perna, is awaiting return call. Advised patient to f/u with Dr. Kerin Perna, return call to [facility] if any additional f/u recommended with Dr. Sabra Heck. Advised patient Dr. Sabra Heck is out of the office today, will review with covering provider and return call with any additional recommendations. Patient verbalizes understanding and is agreeable.   Routing to covering provider Dr. Quincy Simmonds for review.   Dr. Quincy Simmonds -any additional recommendations?  Cc: Dr. Sabra Heck

## 2017-08-15 NOTE — Telephone Encounter (Signed)
Spoke with patient, advised as seen below per Dr. Sabra Heck. Patient declines referral to cardiologist at this time.   Has taken Femara with days 3-7 of menses. Denies SHOB and fever. Patient states she did receive return call from Dr. Charlett Lango office, was advised symptoms are common after IUI. Patient aware to return call to office with any concerns or questions. Aware if [facility], fever or severe pain develops to seek immediate care, if after hours go to local ER/WH. Patient thankful for return call, verbalizes understanding and is agreeable.  Routing to provider for final review. Patient is agreeable to disposition. Will close encounter.

## 2017-08-15 NOTE — Telephone Encounter (Signed)
Patient states she is feeling abdominal bloating and discomfort after IUI on Friday. Patient requests to speak with Dr. Sabra Heck or nurse. Please advise.

## 2017-08-15 NOTE — Telephone Encounter (Signed)
Left message to call Pierce Barocio at [phone].  

## 2017-08-30 ENCOUNTER — Telehealth: Payer: Self-pay | Admitting: Obstetrics & Gynecology

## 2017-08-30 ENCOUNTER — Ambulatory Visit: Payer: BLUE CROSS/BLUE SHIELD | Admitting: Obstetrics & Gynecology

## 2017-08-30 NOTE — Telephone Encounter (Signed)
Patient canceled her possible yeast infection appointment that was scheduled earlier today. Patient does not wish to reschedule.

## 2017-09-09 DIAGNOSIS — B349 Viral infection, unspecified: Secondary | ICD-10-CM | POA: Diagnosis not present

## 2017-10-03 DIAGNOSIS — Z319 Encounter for procreative management, unspecified: Secondary | ICD-10-CM | POA: Diagnosis not present

## 2017-10-08 ENCOUNTER — Telehealth: Payer: Self-pay | Admitting: Obstetrics & Gynecology

## 2017-10-08 NOTE — Telephone Encounter (Signed)
I'm ok with plan of care.  Ok to close encounter.

## 2017-10-08 NOTE — Telephone Encounter (Signed)
Patient has surgery earlier in the year for endometriosis.  Patient states she is beginning to experience pain again and thinks she may have a cyst.

## 2017-10-08 NOTE — Telephone Encounter (Signed)
Spoke with patient. Patient request OV to discuss recommended tx options for left ovarian cyst. Surgery in 03/2017 for endometriosis.   - IUI 08/10/17  - LMP 09/22/17 -experiencing spotting b/w cycles  - PUS with Dr. Kerin Perna on 10/03/17 noted left ovarian cyst, treatment options discussed  - Constant left sided pelvic pain 3/10.   Denies N/V, fever/chills, urinary complaints.   Patient will request copy of recent PUS be sent to Dr. Sabra Heck. Request to schedule on 12/7. OV scheduled for 12/7 at 10:15am with Dr. Sabra Heck. Advised patient should new symptoms develop or pain become sever, seek care at local ER/WH. Will review with Dr. Sabra Heck and return call with any additional recommendations, patient is agreeable.   Dr. Sabra Heck -please review.

## 2017-10-12 ENCOUNTER — Ambulatory Visit (INDEPENDENT_AMBULATORY_CARE_PROVIDER_SITE_OTHER): Payer: BLUE CROSS/BLUE SHIELD | Admitting: Obstetrics & Gynecology

## 2017-10-12 ENCOUNTER — Other Ambulatory Visit: Payer: Self-pay

## 2017-10-12 ENCOUNTER — Encounter: Payer: Self-pay | Admitting: Obstetrics & Gynecology

## 2017-10-12 VITALS — BP 112/62 | HR 72 | Resp 16 | Wt 124.0 lb

## 2017-10-12 DIAGNOSIS — Z23 Encounter for immunization: Secondary | ICD-10-CM

## 2017-10-12 DIAGNOSIS — N809 Endometriosis, unspecified: Principal | ICD-10-CM

## 2017-10-12 NOTE — Progress Notes (Signed)
GYNECOLOGY  VISIT  CC:   Discuss ovarian cyst  HPI: 26 y.o. G89P0010 Married Caucasian female here for discussion of probable endometrioma that has been noted with Dr. Kerin Perna while doing fertility treatment.  Pt is using Femara and Ovidril and then doing HCG trigger shots.  Ultrasounds show.  She has history of stage IV endometriosis and is trying for pregnancy.  Dr. Kerin Perna has recommended that she have this drained vaginally.  Reports he's advised 3-6 cycles with current regimen.  She is on her third cycle.  We discussed possible timing for IVF.  Desires my opinion on this as I have personal experience with these issues.  Answered all questions.    She is taking a PNV.  GYNECOLOGIC HISTORY: Patient's last menstrual period was 09/22/2017. Contraception: none  Patient Active Problem List   Diagnosis Date Noted  . Enterocolitis 03/28/2017  . Leukocytosis 03/28/2017  . Thrombocytosis ([place]) 03/28/2017  . Prolonged Q-T interval on ECG 03/28/2017  . Endometriosis 03/20/2017  . 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 Multinodular goiter 05/29/2016  . Allergic rhinitis   . Neck nodule 05/26/2016    Past Medical History:  Diagnosis Date  . Allergic rhinitis   . Endometriosis   . Thyroid nodule     Past Surgical History:  Procedure Laterality Date  . CHROMOPERTUBATION Bilateral 03/20/2017   Procedure: CHROMOPERTUBATION;  Surgeon: Megan Salon, MD;  Location: [facility];  Service: Gynecology;  Laterality: Bilateral;  Fallopian tubes  . CYSTOSCOPY N/A 03/20/2017   Procedure: CYSTOSCOPY;  Surgeon: Megan Salon, MD;  Location: [facility];  Service: Gynecology;  Laterality: N/A;  . FOOT SURGERY Left    neuromona  . LAPAROSCOPIC OVARIAN CYSTECTOMY Bilateral 03/20/2017   Procedure: LAPAROSCOPIC OVARIAN CYSTECTOMY;  Surgeon: Megan Salon, MD;  Location: [facility];  Service: Gynecology;  Laterality: Bilateral;  . LAPAROSCOPIC UNILATERAL SALPINGECTOMY Right 03/20/2017   Procedure: LAPAROSCOPIC  UNILATERAL SALPINGECTOMY;  Surgeon: Megan Salon, MD;  Location: [facility];  Service: Gynecology;  Laterality: Right;  . LAPAROSCOPY Bilateral 03/20/2017   Procedure: LAPAROSCOPY OPERATIVE  WITH LYSIS OF ADHESIONS;  Surgeon: Megan Salon, MD;  Location: [facility];  Service: Gynecology;  Laterality: Bilateral;  . WISDOM TOOTH EXTRACTION      MEDS:   Current Outpatient Medications on File Prior to Visit  Medication Sig Dispense Refill  . acetaminophen (TYLENOL) 325 MG tablet Take 650 mg by mouth every 6 (six) hours as needed.    Marland Kitchen letrozole (FEMARA) 2.5 MG tablet TAKE 2 TABLETS DAILY FOR DAYS 3-7 OF CYCLE  3  . loratadine (CLARITIN) 10 MG tablet Take 10 mg by mouth as needed for allergies.    Marland Kitchen OVIDREL 250 MCG/0.5ML injection as needed.    . Prenatal Vit-Fe Fumarate-FA (PRENATAL MULTIVITAMIN) TABS tablet Take 1 tablet by mouth daily at 12 noon.    Marland Kitchen PROAIR HFA 108 (90 Base) MCG/ACT inhaler INHALE 2 PUFFS INTO THE LUNGS EVERY 4 HOURS AS NEEDED  0   No current facility-administered medications on file prior to visit.     ALLERGIES: Shellfish allergy; Amoxicillin-pot clavulanate; and Ciprofloxacin  Family History  Problem Relation Age of Onset  . Breast cancer Mother 25        mastectomy  . Thyroid disease Neg Hx     SH:  Married, non smoker  Review of Systems  All other systems reviewed and are negative.   PHYSICAL EXAMINATION:    BP 112/62 (BP Location: Right Arm, Patient Position: Sitting, Cuff Size:  Normal)   Pulse 72   Resp 16   Wt 124 lb (56.2 kg)   LMP 09/22/2017   BMI 22.68 kg/m     General appearance: alert, cooperative and appears stated age No exam performed  Assessment: Endometriosis Undergoing fertility treatment, considering IVF  Plan: Feel pt should proceed with transvaginal drainage of ovarian cyst.  Questions answered.     ~20 minutes spent with patient >50% of time was in face to face discussion of above.

## 2017-10-23 DIAGNOSIS — N801 Endometriosis of ovary: Secondary | ICD-10-CM | POA: Diagnosis not present

## 2017-10-23 DIAGNOSIS — N83292 Other ovarian cyst, left side: Secondary | ICD-10-CM | POA: Diagnosis not present

## 2017-10-31 DIAGNOSIS — Z319 Encounter for procreative management, unspecified: Secondary | ICD-10-CM | POA: Diagnosis not present

## 2017-12-18 ENCOUNTER — Ambulatory Visit (INDEPENDENT_AMBULATORY_CARE_PROVIDER_SITE_OTHER): Payer: BLUE CROSS/BLUE SHIELD | Admitting: Certified Nurse Midwife

## 2017-12-18 ENCOUNTER — Other Ambulatory Visit: Payer: Self-pay

## 2017-12-18 ENCOUNTER — Encounter: Payer: Self-pay | Admitting: Certified Nurse Midwife

## 2017-12-18 VITALS — BP 100/68 | HR 64 | Resp 16 | Ht 62.5 in | Wt 126.0 lb

## 2017-12-18 DIAGNOSIS — N898 Other specified noninflammatory disorders of vagina: Principal | ICD-10-CM

## 2017-12-18 NOTE — Patient Instructions (Signed)

## 2017-12-18 NOTE — Progress Notes (Signed)
[name] [name] here with complaint of vaginal symptoms of itching,  and increase discharge. Describes discharge as some yellow greenish color with odor. Onset of symptoms 7-8 days ago. Denies new personal products. No STD concerns. Urinary symptoms none . Contraception is trying for pregnancy, with 3rd IUI, but started period today. Patient has been using progesterone suppositories, per MD. She feels this irritates her with  each use. No other issues today. If needs treatment, can proceed due to menses.  ROS Pertinent to HPI  O:Healthy female WDWN Affect: normal, orientation x 3  Exam:Skin: warm and dry Abdomen:soft, non tender  Inguinal Lymph nodes: no enlargement or tenderness Pelvic exam: External genital: normal female, no lesions, scaling or exudate BUS: negative Vagina: bloody discharge noted.  Affirm taken Cervix: normal, non tender, no CMT Uterus: normal, non tender Adnexa:normal, non tender, no masses or fullness noted    A:Normal pelvic exam R/O vaginal infection Working with infertility for pregnancy   P:Discussed findings of normal pelvic exam.Due to blood being present unable do office wet prep and diagnose while here.  Discussed Aveeno or baking soda sitz bath for comfort. Avoid moist clothes for extended period of time. Also period sometimes will remove the problem. Wished well with next phase of work up for infertility. Lab: Affirm will treat if indicated   Rv prn

## 2017-12-19 ENCOUNTER — Other Ambulatory Visit: Payer: Self-pay

## 2017-12-19 LAB — VAGINITIS/VAGINOSIS, DNA PROBE
Candida Species: NEGATIVE
Gardnerella vaginalis: POSITIVE — AB
Trichomonas vaginosis: NEGATIVE

## 2017-12-19 MED ORDER — METRONIDAZOLE 500 MG PO TABS: 500.0000 mg | ORAL_TABLET | Freq: Two times a day (BID) | ORAL | 0 refills | Status: DC

## 2017-12-24 ENCOUNTER — Ambulatory Visit (INDEPENDENT_AMBULATORY_CARE_PROVIDER_SITE_OTHER): Payer: BLUE CROSS/BLUE SHIELD | Admitting: Obstetrics & Gynecology

## 2017-12-24 ENCOUNTER — Encounter: Payer: Self-pay | Admitting: Obstetrics & Gynecology

## 2017-12-24 ENCOUNTER — Telehealth: Payer: Self-pay | Admitting: Certified Nurse Midwife

## 2017-12-24 VITALS — BP 120/72 | HR 80 | Temp 98.0°F | Resp 16 | Wt 124.0 lb

## 2017-12-24 DIAGNOSIS — R3915 Urgency of urination: Principal | ICD-10-CM

## 2017-12-24 LAB — POCT URINALYSIS DIPSTICK
Bilirubin, UA: NEGATIVE
Glucose, UA: NEGATIVE
Ketones, UA: NEGATIVE
Nitrite, UA: NEGATIVE
Protein, UA: NEGATIVE
Urobilinogen, UA: 0.2 E.U./dL
pH, UA: 6 (ref 5.0–8.0)

## 2017-12-24 MED ORDER — SULFAMETHOXAZOLE-TRIMETHOPRIM 800-160 MG PO TABS
1.0000 | ORAL_TABLET | Freq: Two times a day (BID) | ORAL | 0 refills | Status: DC
Start: 2017-12-24 — End: 2018-01-09

## 2017-12-24 NOTE — Telephone Encounter (Signed)
Call to patient. Patient states that she is currently being treated for BV and is taking flagyl. Patient states she thinks she is starting to get a UTI because she is having increased urinary frequency. Denies pain or burning with urination. OV offered for further evaluation. Patient agreeable. Patient scheduled for 12/24/17 at 1600. Patient agreeable to date and time of appointment.   Routing to provider for final review. Patient agreeable to disposition. Will close encounter.

## 2017-12-24 NOTE — Progress Notes (Signed)
GYNECOLOGY  VISIT  CC:   dysuria  HPI: 27 y.o. G35P0010 Married Caucasian female here for urinary urgency and dysuria that started this weekend.  Symptoms have been present now about two days.   Feels like she is not fully emptying her bladder as well.  Denies back pain or fever.  Has no abnormal discharge or irregular bleeding.  Denies hematuria.    She has not taken any thing OTC for these symptoms but is also being treated for BV from Dr. Kerin Perna.  They are moving ahead with IVF this next month.  Has some questions about this.    GYNECOLOGIC HISTORY: Patient's last menstrual period was 12/17/2017. Contraception: none Menopausal hormone therapy: none  Patient Active Problem List   Diagnosis Date Noted  . Enterocolitis 03/28/2017  . Leukocytosis 03/28/2017  . Thrombocytosis ([place]) 03/28/2017  . Prolonged Q-T interval on ECG 03/28/2017  . Endometriosis 03/20/2017  . 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 Multinodular goiter 05/29/2016  . Allergic rhinitis   . Neck nodule 05/26/2016    Past Medical History:  Diagnosis Date  . Allergic rhinitis   . Endometriosis   . Thyroid nodule     Past Surgical History:  Procedure Laterality Date  . CHROMOPERTUBATION Bilateral 03/20/2017   Procedure: CHROMOPERTUBATION;  Surgeon: Megan Salon, MD;  Location: [facility];  Service: Gynecology;  Laterality: Bilateral;  Fallopian tubes  . CYSTOSCOPY N/A 03/20/2017   Procedure: CYSTOSCOPY;  Surgeon: Megan Salon, MD;  Location: [facility];  Service: Gynecology;  Laterality: N/A;  . FOOT SURGERY Left    neuromona  . LAPAROSCOPIC OVARIAN CYSTECTOMY Bilateral 03/20/2017   Procedure: LAPAROSCOPIC OVARIAN CYSTECTOMY;  Surgeon: Megan Salon, MD;  Location: [facility];  Service: Gynecology;  Laterality: Bilateral;  . LAPAROSCOPIC UNILATERAL SALPINGECTOMY Right 03/20/2017   Procedure: LAPAROSCOPIC UNILATERAL SALPINGECTOMY;  Surgeon: Megan Salon, MD;  Location: [facility];  Service: Gynecology;  Laterality: Right;   . LAPAROSCOPY Bilateral 03/20/2017   Procedure: LAPAROSCOPY OPERATIVE  WITH LYSIS OF ADHESIONS;  Surgeon: Megan Salon, MD;  Location: [facility];  Service: Gynecology;  Laterality: Bilateral;  . WISDOM TOOTH EXTRACTION      MEDS:   Current Outpatient Medications on File Prior to Visit  Medication Sig Dispense Refill  . acetaminophen (TYLENOL) 325 MG tablet Take 650 mg by mouth every 6 (six) hours as needed.    . loratadine (CLARITIN) 10 MG tablet Take 10 mg by mouth as needed for allergies.    . metroNIDAZOLE (FLAGYL) 500 MG tablet Take 1 tablet (500 mg total) by mouth 2 (two) times daily. 14 tablet 0  . Prenatal Vit-Fe Fumarate-FA (PRENATAL MULTIVITAMIN) TABS tablet Take 1 tablet by mouth daily at 12 noon.    Marland Kitchen PROAIR HFA 108 (90 Base) MCG/ACT inhaler INHALE 2 PUFFS INTO THE LUNGS EVERY 4 HOURS AS NEEDED  0   No current facility-administered medications on file prior to visit.     ALLERGIES: Shellfish allergy; Amoxicillin-pot clavulanate; and Ciprofloxacin  Family History  Problem Relation Age of Onset  . Breast cancer Mother 19        mastectomy  . Thyroid disease Neg Hx     SH:  Married, non smoker  Review of Systems  Constitutional: Negative.   Gastrointestinal: Negative.   Genitourinary: Positive for dysuria, frequency and urgency. Negative for hematuria.  All other systems reviewed and are negative.   PHYSICAL EXAMINATION:    BP 120/72 (BP Location: Right Arm, Patient Position: Sitting, Cuff Size: Normal)  Pulse 80   Temp 98 F (36.7 C) (Oral)   Resp 16   Wt 124 lb (56.2 kg)   LMP 12/17/2017 Comment: spotting  BMI 22.32 kg/m     General appearance: alert, cooperative and appears stated age Abdomen: soft, non-tender; bowel sounds normal; no masses,  no organomegaly  Pelvic: External genitalia:  no lesions              Urethra:  normal appearing urethra with no masses, tenderness or lesions              Bartholins and Skenes: normal                 Vagina:  normal appearing vagina with normal color and discharge, no lesions              Chaperone was present for exam.  Assessment: Cystitis  Plan: Urine culture pending Bactrim DS BID x 3 days.  Rx to pharmacy.

## 2017-12-24 NOTE — Telephone Encounter (Signed)
Patient was seen last week and has some questions for Regina Eck, CNM nurse.

## 2017-12-26 DIAGNOSIS — Z113 Encounter for screening for infections with a predominantly sexual mode of transmission: Secondary | ICD-10-CM | POA: Diagnosis not present

## 2017-12-26 DIAGNOSIS — Z3141 Encounter for fertility testing: Secondary | ICD-10-CM | POA: Diagnosis not present

## 2017-12-26 DIAGNOSIS — N801 Endometriosis of ovary: Secondary | ICD-10-CM | POA: Diagnosis not present

## 2017-12-26 DIAGNOSIS — N8500 Endometrial hyperplasia, unspecified: Secondary | ICD-10-CM | POA: Diagnosis not present

## 2017-12-26 DIAGNOSIS — N83291 Other ovarian cyst, right side: Secondary | ICD-10-CM | POA: Diagnosis not present

## 2017-12-26 DIAGNOSIS — Z319 Encounter for procreative management, unspecified: Secondary | ICD-10-CM | POA: Diagnosis not present

## 2017-12-28 ENCOUNTER — Other Ambulatory Visit: Payer: Self-pay | Admitting: Obstetrics & Gynecology

## 2017-12-28 LAB — URINE CULTURE

## 2017-12-28 LAB — URINALYSIS, MICROSCOPIC ONLY: Casts: NONE SEEN /lpf

## 2017-12-28 MED ORDER — NITROFURANTOIN MONOHYD MACRO 100 MG PO CAPS
100.0000 mg | ORAL_CAPSULE | Freq: Two times a day (BID) | ORAL | 0 refills | Status: DC
Start: 2017-12-28 — End: 2018-01-09

## 2017-12-31 NOTE — Addendum Note (Signed)
Addended by: Polly Cobia on: 12/31/2017 09:06 AM   Modules accepted: Orders

## 2018-01-09 ENCOUNTER — Other Ambulatory Visit: Payer: Self-pay

## 2018-01-09 ENCOUNTER — Ambulatory Visit (INDEPENDENT_AMBULATORY_CARE_PROVIDER_SITE_OTHER): Payer: BLUE CROSS/BLUE SHIELD | Admitting: *Deleted

## 2018-01-09 VITALS — BP 100/60 | HR 68 | Resp 14 | Wt 127.0 lb

## 2018-01-09 DIAGNOSIS — R8290 Unspecified abnormal findings in urine: Principal | ICD-10-CM

## 2018-01-09 NOTE — Progress Notes (Signed)
Patient here for follow up urine culture. Patient completed treatment and states all symptoms have resolved. Clean catch urine sent for urine culture per Dr. Sabra Heck.   Routing to provider for final review. Patient agreeable to disposition. Will close encounter.

## 2018-01-10 LAB — URINE CULTURE

## 2018-01-10 NOTE — Progress Notes (Signed)
Reviewed personally.

## 2018-01-12 ENCOUNTER — Encounter: Payer: Self-pay | Admitting: Obstetrics & Gynecology

## 2018-01-12 DIAGNOSIS — N3000 Acute cystitis without hematuria: Secondary | ICD-10-CM | POA: Diagnosis not present

## 2018-01-13 ENCOUNTER — Encounter (HOSPITAL_COMMUNITY): Payer: Self-pay | Admitting: Emergency Medicine

## 2018-01-13 ENCOUNTER — Ambulatory Visit (HOSPITAL_COMMUNITY)
Admission: EM | Admit: 2018-01-13 | Discharge: 2018-01-13 | Disposition: A | Discharge status: Home / Self Care | Payer: BLUE CROSS/BLUE SHIELD | Attending: Internal Medicine | Admitting: Internal Medicine

## 2018-01-13 DIAGNOSIS — Z79899 Other long term (current) drug therapy: Secondary | ICD-10-CM

## 2018-01-13 DIAGNOSIS — Z803 Family history of malignant neoplasm of breast: Secondary | ICD-10-CM

## 2018-01-13 DIAGNOSIS — Z91013 Allergy to seafood: Secondary | ICD-10-CM

## 2018-01-13 DIAGNOSIS — Z9889 Other specified postprocedural states: Secondary | ICD-10-CM

## 2018-01-13 DIAGNOSIS — Z881 Allergy status to other antibiotic agents status: Secondary | ICD-10-CM

## 2018-01-13 DIAGNOSIS — Z7952 Long term (current) use of systemic steroids: Secondary | ICD-10-CM

## 2018-01-13 DIAGNOSIS — Z880 Allergy status to penicillin: Secondary | ICD-10-CM

## 2018-01-13 DIAGNOSIS — J039 Acute tonsillitis, unspecified: Principal | ICD-10-CM

## 2018-01-13 DIAGNOSIS — E042 Nontoxic multinodular goiter: Secondary | ICD-10-CM

## 2018-01-13 DIAGNOSIS — Z90721 Acquired absence of ovaries, unilateral: Secondary | ICD-10-CM

## 2018-01-13 LAB — POCT RAPID STREP A: Streptococcus, Group A Screen (Direct): NEGATIVE

## 2018-01-13 MED ORDER — PHENOL 1.4 % MT LIQD: 1.0000 | OROMUCOSAL | 0 refills | Status: DC | PRN

## 2018-01-13 MED ORDER — AMOXICILLIN 500 MG PO CAPS: 500.0000 mg | ORAL_CAPSULE | Freq: Three times a day (TID) | ORAL | 0 refills | Status: DC

## 2018-01-13 NOTE — ED Triage Notes (Signed)
Pt here with sore throat x 2 days. 

## 2018-01-13 NOTE — Discharge Instructions (Signed)
Start amoxicillin for tonsillitis. Phenol for sore throat.  Please monitor your symptoms closely. If symptoms worsens, increased swelling of the throat, trouble breathing, trouble swallowing, changes in voice, drooling, leaning forward to breathe, go to the emergency department for further evaluation.

## 2018-01-13 NOTE — ED Provider Notes (Signed)
[place]    CSN: [id number] Arrival date & time: 01/13/18  [place]     History   Chief Complaint Chief Complaint  Patient presents with  . Sore Throat    HPI Kura Bethards is a 27 y.o. female.   27 year old female comes in for 1 day history of sore throat.  Denies rhinorrhea, nasal congestion, cough.  Denies fever, chills, night sweats.  Denies changes in voice, swelling of the throat, trouble breathing, trouble swallowing.  Has not taken anything for symptoms.  Never smoker.      Past Medical History:  Diagnosis Date  . Allergic rhinitis   . Endometriosis   . Thyroid nodule     Patient Active Problem List   Diagnosis Date Noted  . Enterocolitis 03/28/2017  . Leukocytosis 03/28/2017  . Thrombocytosis ([place]) 03/28/2017  . Prolonged Q-T interval on ECG 03/28/2017  . Endometriosis 03/20/2017  . 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 Multinodular goiter 05/29/2016  . Allergic rhinitis   . Neck nodule 05/26/2016    Past Surgical History:  Procedure Laterality Date  . CHROMOPERTUBATION Bilateral 03/20/2017   Procedure: CHROMOPERTUBATION;  Surgeon: Megan Salon, MD;  Location: [facility];  Service: Gynecology;  Laterality: Bilateral;  Fallopian tubes  . CYSTOSCOPY N/A 03/20/2017   Procedure: CYSTOSCOPY;  Surgeon: Megan Salon, MD;  Location: [facility];  Service: Gynecology;  Laterality: N/A;  . FOOT SURGERY Left    neuromona  . LAPAROSCOPIC OVARIAN CYSTECTOMY Bilateral 03/20/2017   Procedure: LAPAROSCOPIC OVARIAN CYSTECTOMY;  Surgeon: Megan Salon, MD;  Location: [facility];  Service: Gynecology;  Laterality: Bilateral;  . LAPAROSCOPIC UNILATERAL SALPINGECTOMY Right 03/20/2017   Procedure: LAPAROSCOPIC UNILATERAL SALPINGECTOMY;  Surgeon: Megan Salon, MD;  Location: [facility];  Service: Gynecology;  Laterality: Right;  . LAPAROSCOPY Bilateral 03/20/2017   Procedure: LAPAROSCOPY OPERATIVE  WITH LYSIS OF ADHESIONS;  Surgeon: Megan Salon, MD;  Location: [facility];  Service:  Gynecology;  Laterality: Bilateral;  . WISDOM TOOTH EXTRACTION      OB History    Gravida Para Term Preterm AB Living   1 0 0 0 1 0   SAB TAB Ectopic Multiple Live Births   1 0 0 0         Home Medications    Prior to Admission medications   Medication Sig Start Date End Date Taking? Authorizing Provider  acetaminophen (TYLENOL) 325 MG tablet Take 650 mg by mouth every 6 (six) hours as needed.    [provider]  amoxicillin (AMOXIL) 500 MG capsule Take 1 capsule (500 mg total) by mouth 3 (three) times daily. 01/13/18   Yu, Amy V, PA-C  B-D 3CC LUER-LOK SYR 18GX1-1/2 18G X 1-1/2" 3 ML MISC USE ONLY TO DRAW UP MEDICATION  CHANGE TO APPROPRIATE NEEDLE TO INJECT 12/31/17   [provider]  BD DISP NEEDLES 22G X 1-1/2" MISC See admin instructions. 12/31/17   [provider]  BD DISP NEEDLES 30G X 1/2" MISC See admin instructions. 12/31/17   [provider]  estradiol (ESTRACE) 2 MG tablet Take 2 mg by mouth 2 (two) times daily. 12/31/17   [provider]  estradiol (VIVELLE-DOT) 0.1 MG/24HR patch APPLY 1 PATCH TO SKIN EVERY 3 DAYS 12/31/17   [provider]  GONAL-F 1050 units SOLR MIX AND INJECT 225 UNITS SUB CUTANEOUSLY EVERY DAY 12/31/17   [provider]  loratadine (CLARITIN) 10 MG tablet Take 10 mg by mouth as needed for allergies.  [provider]  MENOPUR 75 units SOLR MIX AND INJECT 75 UNITS SUB CUTANEOUSLY EVERY DAY 12/31/17   [provider]  methylPREDNISolone (MEDROL) 8 MG tablet Take 8 mg by mouth 2 (two) times daily. 12/31/17   [provider]  NOVAREL 5000 units SOLR MIX 2 VIALS OF POWDER WITH 1ML OF DILUENT AND INJECT RESULTANT MIXTURE  10,000 UNITS  FOR TRIGGER AS DIRECTED 12/31/17   [provider]  phenol (CHLORASEPTIC) 1.4 % LIQD Use as directed 1 spray in the mouth or throat as needed for throat irritation / pain. 01/13/18   Ok Edwards, PA-C  Prenatal Vit-Fe Fumarate-FA (PRENATAL  MULTIVITAMIN) TABS tablet Take 1 tablet by mouth daily at 12 noon.    [provider]  PROAIR HFA 108 (90 Base) MCG/ACT inhaler INHALE 2 PUFFS INTO THE LUNGS EVERY 4 HOURS AS NEEDED 09/08/17   [provider]  progesterone 50 MG/ML injection INJECT 1ML INTRA MUSCULARLY EVERY DAY AS DIRECTED  STORE AT ROOM TEMPERATURE 12/31/17   [provider]  SYNAREL 2 MG/ML SOLN USE 1 SPRAY IN ALTERNATING NOSTRILS TWICE A DAY 12/27/17   [provider]    Family History Family History  Problem Relation Age of Onset  . Breast cancer Mother 11        mastectomy  . Thyroid disease Neg Hx     Social History Social History   Tobacco Use  . Smoking status: Never Smoker  . Smokeless tobacco: Never Used  Substance Use Topics  . Alcohol use: Yes    Alcohol/week: 0.6 - 1.8 oz    Types: 1 - 3 Standard drinks or equivalent per week    Comment: occasional   . Drug use: No     Allergies   Shellfish allergy; Amoxicillin-pot clavulanate; and Ciprofloxacin   Review of Systems Review of Systems  Reason unable to perform ROS: See HPI as above.     Physical Exam Triage Vital Signs ED Triage Vitals [01/13/18 1928]  Enc Vitals Group     BP 111/85     Pulse Rate 82     Resp 18     Temp 98.5 F (36.9 C)     Temp Source Oral     SpO2 100 %     Weight      Height      Head Circumference      Peak Flow      Pain Score 4     Pain Loc      Pain Edu?      Excl. in [place]?    No data found.  Updated Vital Signs BP 111/85 (BP Location: Right Arm)   Pulse 82   Temp 98.5 F (36.9 C) (Oral)   Resp 18   LMP 12/17/2017 Comment: spotting  SpO2 100%   Physical Exam  Constitutional: She is oriented to person, place, and time. She appears well-developed and well-nourished. No distress.  HENT:  Head: Normocephalic and atraumatic.  Right Ear: Tympanic membrane, external ear and ear canal normal. Tympanic membrane is not erythematous and not bulging.  Left Ear: Tympanic  membrane, external ear and ear canal normal. Tympanic membrane is not erythematous and not bulging.  Nose: Nose normal. Right sinus exhibits no maxillary sinus tenderness and no frontal sinus tenderness. Left sinus exhibits no maxillary sinus tenderness and no frontal sinus tenderness.  Mouth/Throat: Uvula is midline and mucous membranes are normal. Posterior oropharyngeal erythema present. Tonsils are 3+ on the right. Tonsils are  1+ on the left. Tonsillar exudate.  Eyes: Conjunctivae are normal. Pupils are equal, round, and reactive to light.  Neck: Normal range of motion. Neck supple.  Cardiovascular: Normal rate, regular rhythm and normal heart sounds. Exam reveals no gallop and no friction rub.  No murmur heard. Pulmonary/Chest: Effort normal and breath sounds normal. She has no decreased breath sounds. She has no wheezes. She has no rhonchi. She has no rales.  Lymphadenopathy:    She has no cervical adenopathy.  Neurological: She is alert and oriented to person, place, and time.  Skin: Skin is warm and dry.  Psychiatric: She has a normal mood and affect. Her behavior is normal. Judgment normal.     UC Treatments / Results  Labs (all labs ordered are listed, but only abnormal results are displayed) Labs Reviewed  CULTURE, GROUP A STREP [facility])  POCT RAPID STREP A    EKG  EKG Interpretation None       Radiology No results found.  Procedures Procedures (including critical care time)  Medications Ordered in UC Medications - No data to display   Initial Impression / Assessment and Plan / UC Course  I have reviewed the triage vital signs and the nursing notes.  Pertinent labs & imaging results that were available during my care of the patient were reviewed by me and considered in my medical decision making (see chart for details).    Rapid strep negative.  However, given history and exam, will treat for tonsillitis with amoxicillin.  Patient records show history of  allergies to Augmentin, patient does not recall what the symptoms are.  She has taken amoxicillin in the past without problems.  She is currently on Cipro for suspected UTI.  Strict return precautions given.  Final Clinical Impressions(s) / UC Diagnoses   Final diagnoses:  Acute tonsillitis, unspecified etiology    ED Discharge Orders        Ordered    amoxicillin (AMOXIL) 500 MG capsule  3 times daily     01/13/18 2009    phenol (CHLORASEPTIC) 1.4 % LIQD  As needed     01/13/18 2009        Arturo Morton 01/13/18 2013

## 2018-01-15 NOTE — Telephone Encounter (Signed)
Patient is having uti symptoms again and would like another round of antibiotics. Last seen 12/24/17. Can call back at [phone].

## 2018-01-16 LAB — CULTURE, GROUP A STREP (THRC)

## 2018-01-25 DIAGNOSIS — N83291 Other ovarian cyst, right side: Secondary | ICD-10-CM | POA: Diagnosis not present

## 2018-01-25 DIAGNOSIS — N971 Female infertility of tubal origin: Secondary | ICD-10-CM | POA: Diagnosis not present

## 2018-01-25 DIAGNOSIS — N802 Endometriosis of fallopian tube: Secondary | ICD-10-CM | POA: Diagnosis not present

## 2018-01-25 DIAGNOSIS — N978 Female infertility of other origin: Secondary | ICD-10-CM | POA: Diagnosis not present

## 2018-01-25 DIAGNOSIS — N801 Endometriosis of ovary: Secondary | ICD-10-CM | POA: Diagnosis not present

## 2018-01-29 DIAGNOSIS — N978 Female infertility of other origin: Secondary | ICD-10-CM | POA: Diagnosis not present

## 2018-01-29 DIAGNOSIS — Z3183 Encounter for assisted reproductive fertility procedure cycle: Secondary | ICD-10-CM | POA: Diagnosis not present

## 2018-01-29 DIAGNOSIS — N971 Female infertility of tubal origin: Secondary | ICD-10-CM | POA: Diagnosis not present

## 2018-01-29 DIAGNOSIS — N801 Endometriosis of ovary: Secondary | ICD-10-CM | POA: Diagnosis not present

## 2018-01-30 DIAGNOSIS — M9903 Segmental and somatic dysfunction of lumbar region: Secondary | ICD-10-CM | POA: Diagnosis not present

## 2018-01-30 DIAGNOSIS — M9905 Segmental and somatic dysfunction of pelvic region: Secondary | ICD-10-CM | POA: Diagnosis not present

## 2018-01-30 DIAGNOSIS — M5386 Other specified dorsopathies, lumbar region: Secondary | ICD-10-CM | POA: Diagnosis not present

## 2018-01-30 DIAGNOSIS — M9902 Segmental and somatic dysfunction of thoracic region: Secondary | ICD-10-CM | POA: Diagnosis not present

## 2018-01-31 DIAGNOSIS — N802 Endometriosis of fallopian tube: Secondary | ICD-10-CM | POA: Diagnosis not present

## 2018-01-31 DIAGNOSIS — N971 Female infertility of tubal origin: Secondary | ICD-10-CM | POA: Diagnosis not present

## 2018-01-31 DIAGNOSIS — N978 Female infertility of other origin: Secondary | ICD-10-CM | POA: Diagnosis not present

## 2018-01-31 DIAGNOSIS — N801 Endometriosis of ovary: Secondary | ICD-10-CM | POA: Diagnosis not present

## 2018-02-05 NOTE — Telephone Encounter (Signed)
Patient was seen by PCP on 01/12/2018.

## 2018-02-07 DIAGNOSIS — Z3141 Encounter for fertility testing: Secondary | ICD-10-CM | POA: Diagnosis not present

## 2018-02-19 DIAGNOSIS — N83202 Unspecified ovarian cyst, left side: Principal | ICD-10-CM

## 2018-02-19 DIAGNOSIS — Z79899 Other long term (current) drug therapy: Secondary | ICD-10-CM

## 2018-02-19 DIAGNOSIS — N83201 Unspecified ovarian cyst, right side: Secondary | ICD-10-CM

## 2018-02-19 DIAGNOSIS — Z7982 Long term (current) use of aspirin: Secondary | ICD-10-CM

## 2018-02-19 DIAGNOSIS — R1010 Upper abdominal pain, unspecified: Secondary | ICD-10-CM | POA: Diagnosis not present

## 2018-02-19 DIAGNOSIS — R197 Diarrhea, unspecified: Secondary | ICD-10-CM | POA: Diagnosis not present

## 2018-02-19 DIAGNOSIS — R1011 Right upper quadrant pain: Secondary | ICD-10-CM | POA: Diagnosis not present

## 2018-02-19 DIAGNOSIS — R109 Unspecified abdominal pain: Secondary | ICD-10-CM | POA: Diagnosis not present

## 2018-02-20 ENCOUNTER — Emergency Department (HOSPITAL_COMMUNITY)
Admission: EM | Admit: 2018-02-20 | Discharge: 2018-02-20 | Disposition: A | Discharge status: Home / Self Care | Payer: BLUE CROSS/BLUE SHIELD | Attending: Emergency Medicine | Admitting: Emergency Medicine

## 2018-02-20 ENCOUNTER — Encounter (HOSPITAL_COMMUNITY): Payer: Self-pay

## 2018-02-20 ENCOUNTER — Emergency Department (HOSPITAL_COMMUNITY): Payer: BLUE CROSS/BLUE SHIELD

## 2018-02-20 DIAGNOSIS — R109 Unspecified abdominal pain: Secondary | ICD-10-CM

## 2018-02-20 DIAGNOSIS — N83202 Unspecified ovarian cyst, left side: Secondary | ICD-10-CM

## 2018-02-20 DIAGNOSIS — N83201 Unspecified ovarian cyst, right side: Secondary | ICD-10-CM

## 2018-02-20 DIAGNOSIS — R197 Diarrhea, unspecified: Secondary | ICD-10-CM | POA: Diagnosis not present

## 2018-02-20 LAB — COMPREHENSIVE METABOLIC PANEL
ALT: 13 U/L — ABNORMAL LOW (ref 14–54)
AST: 14 U/L — ABNORMAL LOW (ref 15–41)
Albumin: 3.3 g/dL — ABNORMAL LOW (ref 3.5–5.0)
Alkaline Phosphatase: 60 U/L (ref 38–126)
Anion gap: 8 (ref 5–15)
BUN: 11 mg/dL (ref 6–20)
CO2: 24 mmol/L (ref 22–32)
Calcium: 8.6 mg/dL — ABNORMAL LOW (ref 8.9–10.3)
Chloride: 106 mmol/L (ref 101–111)
Creatinine, Ser: 0.84 mg/dL (ref 0.44–1.00)
GFR calc Af Amer: >60 mL/min (ref >60)
GFR calc non Af Amer: >60 mL/min (ref >60)
Glucose, Bld: 103 mg/dL — ABNORMAL HIGH (ref 65–99)
Potassium: 3.9 mmol/L (ref 3.5–5.1)
Sodium: 138 mmol/L (ref 135–145)
Total Bilirubin: 0.5 mg/dL (ref 0.3–1.2)
Total Protein: 7.4 g/dL (ref 6.5–8.1)

## 2018-02-20 LAB — CBC
HCT: 33 % — ABNORMAL LOW (ref 36.0–46.0)
Hemoglobin: 10.9 g/dL — ABNORMAL LOW (ref 12.0–15.0)
MCH: 30.4 pg (ref 26.0–34.0)
MCHC: 33 g/dL (ref 30.0–36.0)
MCV: 92.2 fL (ref 78.0–100.0)
Platelets: 385 10*3/uL (ref 150–400)
RBC: 3.58 MIL/uL — ABNORMAL LOW (ref 3.87–5.11)
RDW: 13 % (ref 11.5–15.5)
WBC: 14.3 10*3/uL — ABNORMAL HIGH (ref 4.0–10.5)

## 2018-02-20 LAB — URINALYSIS, ROUTINE W REFLEX MICROSCOPIC
Bilirubin Urine: NEGATIVE
Glucose, UA: NEGATIVE mg/dL
Hgb urine dipstick: NEGATIVE
Ketones, ur: NEGATIVE mg/dL
Leukocytes, UA: NEGATIVE
Nitrite: NEGATIVE
Protein, ur: NEGATIVE mg/dL
Specific Gravity, Urine: 1.017 (ref 1.005–1.030)
pH: 6 (ref 5.0–8.0)

## 2018-02-20 LAB — I-STAT BETA HCG BLOOD, ED (MC, WL, AP ONLY): I-stat hCG, quantitative: <5 m[IU]/mL (ref <5)

## 2018-02-20 LAB — LIPASE, BLOOD: Lipase: 24 U/L (ref 11–51)

## 2018-02-20 MED ORDER — METRONIDAZOLE 500 MG PO TABS: 500.0000 mg | ORAL_TABLET | Freq: Three times a day (TID) | ORAL | 0 refills | Status: DC

## 2018-02-20 MED ORDER — DICYCLOMINE HCL 10 MG/ML IM SOLN
20.0000 mg | Freq: Once | INTRAMUSCULAR | Status: AC
  Administered 2018-02-20: 20 mg via INTRAMUSCULAR
  Filled 2018-02-20: qty 2

## 2018-02-20 MED ORDER — METOCLOPRAMIDE HCL 5 MG/ML IJ SOLN
10.0000 mg | Freq: Once | INTRAMUSCULAR | Status: AC
  Administered 2018-02-20: 10 mg via INTRAVENOUS
  Filled 2018-02-20: qty 2

## 2018-02-20 MED ORDER — KETOROLAC TROMETHAMINE 30 MG/ML IJ SOLN
30.0000 mg | Freq: Once | INTRAMUSCULAR | Status: AC
  Administered 2018-02-20: 30 mg via INTRAVENOUS
  Filled 2018-02-20: qty 1

## 2018-02-20 MED ORDER — DICYCLOMINE HCL 20 MG PO TABS: 20.0000 mg | ORAL_TABLET | Freq: Two times a day (BID) | ORAL | 0 refills | Status: DC | PRN

## 2018-02-20 MED ORDER — SULFAMETHOXAZOLE-TRIMETHOPRIM 800-160 MG PO TABS
1.0000 | ORAL_TABLET | Freq: Once | ORAL | Status: AC
  Administered 2018-02-20: 1 via ORAL
  Filled 2018-02-20: qty 1

## 2018-02-20 MED ORDER — MORPHINE SULFATE (PF) 4 MG/ML IV SOLN: 4.0000 mg | Freq: Once | INTRAVENOUS | Status: DC

## 2018-02-20 MED ORDER — SODIUM CHLORIDE 0.9 % IV BOLUS
1000.0000 mL | Freq: Once | INTRAVENOUS | Status: AC
  Administered 2018-02-20: 1000 mL via INTRAVENOUS

## 2018-02-20 MED ORDER — SULFAMETHOXAZOLE-TRIMETHOPRIM 800-160 MG PO TABS: 1.0000 | ORAL_TABLET | Freq: Two times a day (BID) | ORAL | 0 refills | Status: AC

## 2018-02-20 MED ORDER — METRONIDAZOLE 500 MG PO TABS
500.0000 mg | ORAL_TABLET | Freq: Once | ORAL | Status: AC
  Administered 2018-02-20: 500 mg via ORAL
  Filled 2018-02-20: qty 1

## 2018-02-20 MED ORDER — IOPAMIDOL (ISOVUE-300) INJECTION 61%
INTRAVENOUS | Status: AC
  Filled 2018-02-20: qty 100

## 2018-02-20 MED ORDER — IOPAMIDOL (ISOVUE-300) INJECTION 61%
100.0000 mL | Freq: Once | INTRAVENOUS | Status: AC | PRN
  Administered 2018-02-20: 100 mL via INTRAVENOUS

## 2018-02-20 NOTE — ED Triage Notes (Signed)
Pt complains of upper abdominal pain since earlier tonight, she has a history of an infection in her ileum and she says the pain feels the same Pt states she had lots of diarrhea on Sunday

## 2018-02-20 NOTE — ED Provider Notes (Signed)
[facility] DEPT Provider Note   CSN: [id number] Arrival date & time: 02/19/18  2356    History   Chief Complaint Chief Complaint  Patient presents with  . Abdominal Pain    HPI Jacqueline [name] is a 27 y.o. female.  27 year old female with a history of endometriosis presents to the emergency department for evaluation of abdominal pain.  Pain was gradual in onset and began around 1800 tonight.  She took ibuprofen for symptoms without relief.  She states that pain is waxing and waning in severity, but fairly constant.  It is sharp and cramping.  She initially thought her pain was secondary to menstrual cramps as she is currently on her menses.  She has not had any fevers, nausea, vomiting, bowel changes, urinary symptoms.  She had a normal bowel movement earlier in the day yesterday.  She began to notice similarities and the pain to when she was diagnosed with enteritis.  Abdominal surgical history significant for laparoscopic ovarian cystectomy, laparoscopic right salpingectomy.  Patient also had an egg retrieval for IVF 2 weeks ago.  The history is provided by the patient. No language interpreter was used.  Abdominal Pain      Past Medical History:  Diagnosis Date  . Allergic rhinitis   . Endometriosis   . Thyroid nodule     Patient Active Problem List   Diagnosis Date Noted  . Enterocolitis 03/28/2017  . Leukocytosis 03/28/2017  . Thrombocytosis ([place]) 03/28/2017  . Prolonged Q-T interval on ECG 03/28/2017  . Endometriosis 03/20/2017  . 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 Multinodular goiter 05/29/2016  . Allergic rhinitis   . Neck nodule 05/26/2016    Past Surgical History:  Procedure Laterality Date  . CHROMOPERTUBATION Bilateral 03/20/2017   Procedure: CHROMOPERTUBATION;  Surgeon: Megan Salon, MD;  Location: [facility];  Service: Gynecology;  Laterality: Bilateral;  Fallopian tubes  . CYSTOSCOPY N/A 03/20/2017   Procedure: CYSTOSCOPY;   Surgeon: Megan Salon, MD;  Location: [facility];  Service: Gynecology;  Laterality: N/A;  . FOOT SURGERY Left    neuromona  . LAPAROSCOPIC OVARIAN CYSTECTOMY Bilateral 03/20/2017   Procedure: LAPAROSCOPIC OVARIAN CYSTECTOMY;  Surgeon: Megan Salon, MD;  Location: [facility];  Service: Gynecology;  Laterality: Bilateral;  . LAPAROSCOPIC UNILATERAL SALPINGECTOMY Right 03/20/2017   Procedure: LAPAROSCOPIC UNILATERAL SALPINGECTOMY;  Surgeon: Megan Salon, MD;  Location: [facility];  Service: Gynecology;  Laterality: Right;  . LAPAROSCOPY Bilateral 03/20/2017   Procedure: LAPAROSCOPY OPERATIVE  WITH LYSIS OF ADHESIONS;  Surgeon: Megan Salon, MD;  Location: [facility];  Service: Gynecology;  Laterality: Bilateral;  . WISDOM TOOTH EXTRACTION       OB History    Gravida  1   Para  0   Term  0   Preterm  0   AB  1   Living  0     SAB  1   TAB  0   Ectopic  0   Multiple  0   Live Births               Home Medications    Prior to Admission medications   Medication Sig Start Date End Date Taking? Authorizing Provider  aspirin 81 MG chewable tablet Chew 81 mg by mouth daily.   Yes [provider]  estradiol (ESTRACE) 2 MG tablet Take 2 mg by mouth 2 (two) times daily. 12/31/17  Yes [provider]  estradiol (VIVELLE-DOT) 0.1 MG/24HR patch APPLY 1 PATCH  TO SKIN EVERY 3 DAYS 12/31/17  Yes [provider]  Prenatal Vit-Fe Fumarate-FA (PRENATAL MULTIVITAMIN) TABS tablet Take 1 tablet by mouth daily at 12 noon.   Yes [provider]  SYNAREL 2 MG/ML SOLN USE 1 SPRAY IN ALTERNATING NOSTRILS TWICE A DAY 12/27/17  Yes [provider]  amoxicillin (AMOXIL) 500 MG capsule Take 1 capsule (500 mg total) by mouth 3 (three) times daily. Patient not taking: Reported on 02/20/2018 01/13/18   Tasia Catchings, Amy V, PA-C  B-D 3CC LUER-LOK SYR 18GX1-1/2 18G X 1-1/2" 3 ML MISC USE ONLY TO DRAW UP MEDICATION  CHANGE TO APPROPRIATE NEEDLE TO INJECT 12/31/17   [provider]  BD DISP NEEDLES 22G X 1-1/2" MISC See admin instructions. 12/31/17   [provider]  BD DISP NEEDLES 30G X 1/2" MISC See admin instructions. 12/31/17   [provider]  dicyclomine (BENTYL) 20 MG tablet Take 1 tablet (20 mg total) by mouth every 12 (twelve) hours as needed (for abdominal pain/cramping). 02/20/18   Antonietta Breach, PA-C  GONAL-F 1050 units SOLR MIX AND INJECT 225 UNITS SUB CUTANEOUSLY EVERY DAY 12/31/17   [provider]  MENOPUR 75 units SOLR MIX AND INJECT 75 UNITS SUB CUTANEOUSLY EVERY DAY 12/31/17   [provider]  methylPREDNISolone (MEDROL) 8 MG tablet Take 8 mg by mouth 2 (two) times daily. 12/31/17   [provider]  metroNIDAZOLE (FLAGYL) 500 MG tablet Take 1 tablet (500 mg total) by mouth 3 (three) times daily. 02/20/18   Antonietta Breach, PA-C  NOVAREL 5000 units SOLR MIX 2 VIALS OF POWDER WITH 1ML OF DILUENT AND INJECT RESULTANT MIXTURE  10,000 UNITS  FOR TRIGGER AS DIRECTED 12/31/17   [provider]  phenol (CHLORASEPTIC) 1.4 % LIQD Use as directed 1 spray in the mouth or throat as needed for throat irritation / pain. Patient not taking: Reported on 02/20/2018 01/13/18   Ok Edwards, PA-C  progesterone 50 MG/ML injection INJECT 1ML INTRA MUSCULARLY EVERY DAY AS DIRECTED  STORE AT ROOM TEMPERATURE 12/31/17   [provider]  sulfamethoxazole-trimethoprim (BACTRIM DS,SEPTRA DS) 800-160 MG tablet Take 1 tablet by mouth 2 (two) times daily for 7 days. 02/20/18 02/27/18  Antonietta Breach, PA-C    Family History Family History  Problem Relation Age of Onset  . Breast cancer Mother 38        mastectomy  . Thyroid disease Neg Hx     Social History Social History   Tobacco Use  . Smoking status: Never Smoker  . Smokeless tobacco: Never Used  Substance Use Topics  . Alcohol use: Yes    Alcohol/week: 0.6 - 1.8 oz    Types: 1 - 3 Standard drinks or equivalent per week    Comment: occasional   . Drug use: No      Allergies   Shellfish allergy; Amoxicillin-pot clavulanate; and Ciprofloxacin   Review of Systems Review of Systems  Gastrointestinal: Positive for abdominal pain.  Ten systems reviewed and are negative for acute change, except as noted in the HPI.    Physical Exam Updated Vital Signs BP 122/73   Pulse 84   Temp 98.1 F (36.7 C) (Oral)   Resp 19   Ht 5\' 2"  (1.575 m)   Wt 59 kg (130 lb)   SpO2 97%   BMI 23.78 kg/m   Physical Exam  Constitutional: She is oriented to person, place, and time. She appears well-developed and well-nourished. No distress.  Nontoxic appearing and in  NAD  HENT:  Head: Normocephalic and atraumatic.  Eyes: Conjunctivae and EOM are normal. No scleral icterus.  Neck: Normal range of motion.  Cardiovascular: Normal rate, regular rhythm and intact distal pulses.  Pulmonary/Chest: Effort normal. No stridor. No respiratory distress. She has no wheezes. She has no rales.  Respirations even and unlabored. Lungs CTAB.  Abdominal: Soft. She exhibits no mass. There is tenderness. There is no guarding.  TTP in the RUQ and LUQ with negative Murphy's sign. No peritoneal signs, palpable masses, guarding.  Musculoskeletal: Normal range of motion.  Neurological: She is alert and oriented to person, place, and time. She exhibits normal muscle tone. Coordination normal.  Skin: Skin is warm and dry. No rash noted. She is not diaphoretic. No erythema. No pallor.  Psychiatric: She has a normal mood and affect. Her behavior is normal.  Nursing note and vitals reviewed.    ED Treatments / Results  Labs (all labs ordered are listed, but only abnormal results are displayed) Labs Reviewed  COMPREHENSIVE METABOLIC PANEL - Abnormal; Notable for the following components:      Result Value   Glucose, Bld 103 (*)    Calcium 8.6 (*)    Albumin 3.3 (*)    AST 14 (*)    ALT 13 (*)    All other components within normal limits  CBC - Abnormal; Notable for the following  components:   WBC 14.3 (*)    RBC 3.58 (*)    Hemoglobin 10.9 (*)    HCT 33.0 (*)    All other components within normal limits  LIPASE, BLOOD  URINALYSIS, ROUTINE W REFLEX MICROSCOPIC  I-STAT BETA HCG BLOOD, ED ([facility], [facility], [facility] ONLY)    EKG None  Radiology Ct Abdomen Pelvis W Contrast  Result Date: 02/20/2018 CLINICAL DATA:  Acute onset of upper abdominal pain and diarrhea. Leukocytosis. EXAM: CT ABDOMEN AND PELVIS WITH CONTRAST TECHNIQUE: Multidetector CT imaging of the abdomen and pelvis was performed using the standard protocol following bolus administration of intravenous contrast. CONTRAST:  147mL ISOVUE-300 IOPAMIDOL (ISOVUE-300) INJECTION 61% COMPARISON:  CT of the abdomen and pelvis performed 03/27/2017 FINDINGS: Lower chest: The visualized lung bases are grossly clear. The visualized portions of the mediastinum are unremarkable. Hepatobiliary: The liver is unremarkable in appearance. The gallbladder is unremarkable in appearance. The common bile duct remains normal in caliber. Pancreas: The pancreas is within normal limits. Spleen: The spleen is unremarkable in appearance. Adrenals/Urinary Tract: The adrenal glands are unremarkable in appearance. A small right renal cyst is noted. There is no evidence of hydronephrosis. No renal or ureteral stones are identified. No perinephric stranding is seen. Stomach/Bowel: The stomach is unremarkable in appearance. The small bowel is within normal limits. The appendix is normal in caliber, without evidence of appendicitis. Mild wall thickening at the sigmoid colon could reflect an acute infectious or inflammatory process. Vascular/Lymphatic: The abdominal aorta is unremarkable in appearance. The inferior vena cava is grossly unremarkable. No retroperitoneal lymphadenopathy is seen. No pelvic sidewall lymphadenopathy is identified. Reproductive: The bladder is mildly distended and grossly unremarkable. A uterine fibroid is noted. Complex cystic structures  are seen at both adnexa, more prominent on the left, with cysts measuring up to 5.2 cm in size. Underlying hydrosalpinx is a possibility. Other: No additional soft tissue abnormalities are seen. Musculoskeletal: No acute osseous abnormalities are identified. Chronic bilateral pars defects are noted at L5, with grade 2 anterolisthesis of L5 on S1. The visualized musculature is unremarkable in appearance. IMPRESSION: 1. Mild  wall thickening of the sigmoid colon could reflect an acute infectious or inflammatory process. 2. Complex cystic structures at both adnexa, more prominent on the left, with cysts measuring up to 5.2 cm in size. Underlying hydrosalpinx is a possibility. This could be better assessed on pelvic ultrasound, as deemed clinically appropriate. 3. Uterine fibroid noted. 4. Small right renal cyst. 5. 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 Electronically Signed   By: Garald Balding M.D.   On: 02/20/2018 05:14    Procedures Procedures (including critical care time)  Medications Ordered in ED Medications  sulfamethoxazole-trimethoprim (BACTRIM DS,SEPTRA DS) 800-160 MG per tablet 1 tablet (has no administration in time range)  metroNIDAZOLE (FLAGYL) tablet 500 mg (has no administration in time range)  sodium chloride 0.9 % bolus 1,000 mL (0 mLs Intravenous Stopped 02/20/18 0532)  metoCLOPramide (REGLAN) injection 10 mg (10 mg Intravenous Given 02/20/18 0427)  dicyclomine (BENTYL) injection 20 mg (20 mg Intramuscular Given 02/20/18 0429)  ketorolac (TORADOL) 30 MG/ML injection 30 mg (30 mg Intravenous Given 02/20/18 0426)  iopamidol (ISOVUE-300) 61 % injection 100 mL (100 mLs Intravenous Contrast Given 02/20/18 0447)     Initial Impression / Assessment and Plan / ED Course  I have reviewed the triage vital signs and the nursing notes.  Pertinent labs & imaging results that were available during my care of the patient were reviewed by me and considered in  my medical decision making (see chart for details).     27 year old female presents to the emergency department for upper abdominal pain which began yesterday.  It is waxing and waning in severity, cramping.  Patient afebrile with stable, reassuring vital signs.  She does have a leukocytosis, though has had mild leukocytosis with prior workups.  She reports that her pain feels similar to when she was diagnosed with enteritis.  She was admitted for management at the time of this diagnosis.  CT scan was obtained to evaluate for emergent or surgical cause of symptoms.  This shows mild wall thickening to the sigmoid colon; however, this does not correspond with the area of patient's tenderness.  She does have bilateral ovarian cysts.  On discussion of the patient's imaging results, she reports that these cysts are known to her.  She states that her OBGYN tried draining her large left cyst, but was unsuccessful.  I have discussed management with antibiotics versus supportive pain control.  Patient would feel more comfortable with antibiotic treatment at this time.  Will discharge on Bactrim and Flagyl given allergies to both Augmentin and ciprofloxacin.  She has had good pain control in the emergency department with Toradol and Bentyl.  Will discharge with course of dicyclomine as well.  Return precautions discussed and provided. Patient discharged in stable condition with no unaddressed concerns.  Vitals:   02/20/18 0255 02/20/18 0256 02/20/18 0330 02/20/18 0537  BP:  108/85 110/76 122/73  Pulse:  82 75 84  Resp:  16 17 19   Temp:      TempSrc:      SpO2:  100% 98% 97%  Weight: 59 kg (130 lb)     Height: 5\' 2"  (1.575 m)       Final Clinical Impressions(s) / ED Diagnoses   Final diagnoses:  Abdominal pain, unspecified abdominal location  Cysts of both ovaries    ED Discharge Orders        Ordered    sulfamethoxazole-trimethoprim (BACTRIM DS,SEPTRA DS) 800-160 MG tablet  2 times  daily      02/20/18 0610    metroNIDAZOLE (FLAGYL) 500 MG tablet  3 times daily     02/20/18 0610    dicyclomine (BENTYL) 20 MG tablet  Every 12 hours PRN     02/20/18 0610       Antonietta Breach, PA-C 02/20/18 0624    Molpus, Jenny Reichmann, MD 02/20/18 [phone]

## 2018-02-20 NOTE — Discharge Instructions (Signed)
Your CT today showed evidence of thickening to your sigmoid colon.  You have been placed on antibiotics for management of sigmoid colitis.  It is also possible this may be due to a viral illness or inflammatory changes.  Continue with ibuprofen every 6 hours for abdominal pain or cramping.  You may supplement this with Bentyl as needed.  Return to the emergency department for new or concerning symptoms.

## 2018-03-18 DIAGNOSIS — Z3201 Encounter for pregnancy test, result positive: Secondary | ICD-10-CM | POA: Diagnosis not present

## 2018-03-18 DIAGNOSIS — Z3200 Encounter for pregnancy test, result unknown: Secondary | ICD-10-CM | POA: Diagnosis not present

## 2018-03-20 DIAGNOSIS — Z3200 Encounter for pregnancy test, result unknown: Secondary | ICD-10-CM | POA: Diagnosis not present

## 2018-03-20 DIAGNOSIS — Z3201 Encounter for pregnancy test, result positive: Secondary | ICD-10-CM | POA: Diagnosis not present

## 2018-03-28 DIAGNOSIS — O200 Threatened abortion: Secondary | ICD-10-CM | POA: Diagnosis not present

## 2018-04-03 DIAGNOSIS — Z3200 Encounter for pregnancy test, result unknown: Secondary | ICD-10-CM | POA: Diagnosis not present

## 2018-04-18 DIAGNOSIS — O0900 Supervision of pregnancy with history of infertility, unspecified trimester: Secondary | ICD-10-CM | POA: Diagnosis not present

## 2018-04-24 DIAGNOSIS — O209 Hemorrhage in early pregnancy, unspecified: Secondary | ICD-10-CM | POA: Diagnosis not present

## 2018-04-24 DIAGNOSIS — Z3A09 9 weeks gestation of pregnancy: Secondary | ICD-10-CM | POA: Diagnosis not present

## 2018-04-24 DIAGNOSIS — O200 Threatened abortion: Secondary | ICD-10-CM | POA: Diagnosis not present

## 2018-05-01 DIAGNOSIS — O4691 Antepartum hemorrhage, unspecified, first trimester: Secondary | ICD-10-CM | POA: Diagnosis not present

## 2018-05-04 IMAGING — CT CT ABD-PELV W/ CM
2 of 4 series · 16 of 46 positions shown, 18 images · IV contrast (ISOVUE)
Comparison: CT abdomen and pelvis March 13, 2017

ADDENDUM:
Acute findings discussed with and reconfirmed by Dr.AZU SUGAR on
03/27/2017 at [DATE].
CLINICAL DATA: Fell squeezing upper mid abdominal pain beginning at
0991 hours today. History of endometriomas, status post laparoscopic
ovarian cystectomy and lysis of adhesions March 20, 2017.

EXAM:
CT ABDOMEN AND PELVIS WITH CONTRAST
TECHNIQUE: Multidetector CT imaging of the abdomen and pelvis was performed
using the standard protocol following bolus administration of
intravenous contrast.
CONTRAST:  100mL 1WJYB5-N99 IOPAMIDOL ([id number]) INJECTION 61%

[Series 2: abd/pel with · axial · 0.68mm/px · z∈[+755,+1120]mm · 13 of 83 slices shown, 15 images]
[im 5/83  soft-tissue]
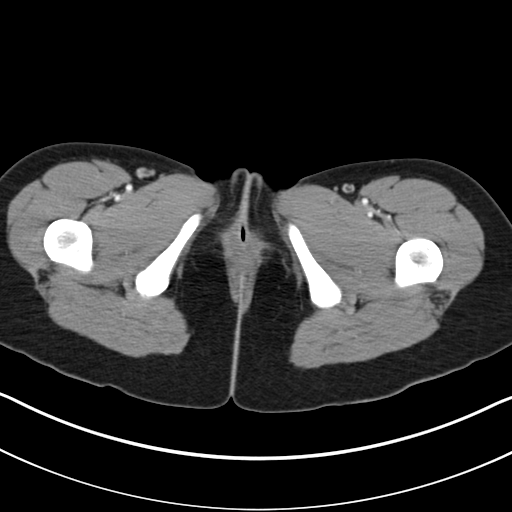
[im 5/83  bone]
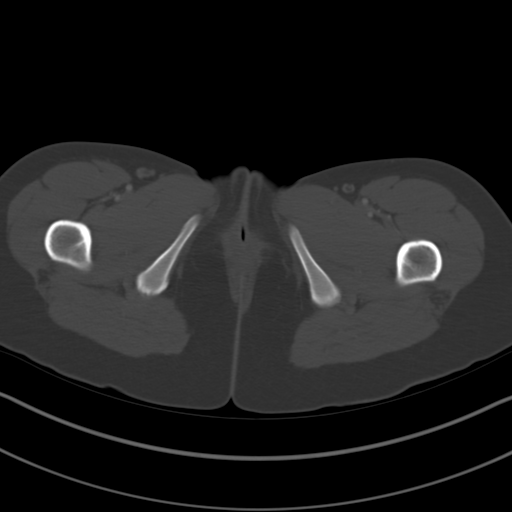
[im 13/83  soft-tissue]
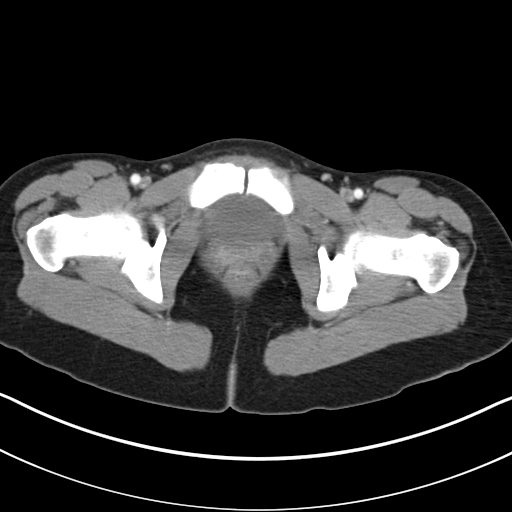
[im 17/83  soft-tissue]
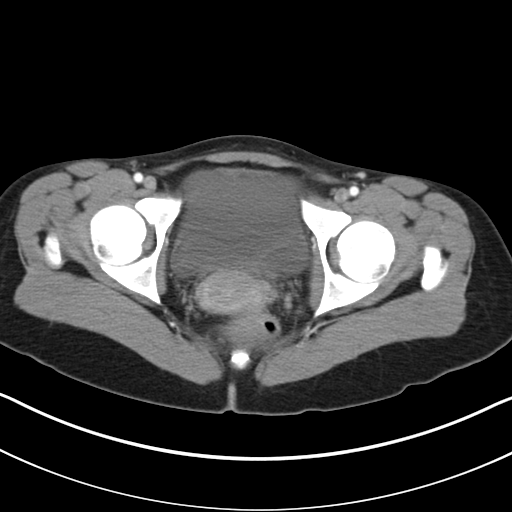
[im 25/83  soft-tissue]
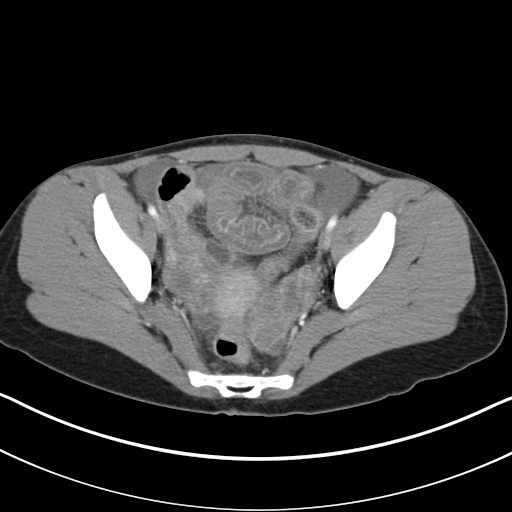
[im 29/83  soft-tissue]
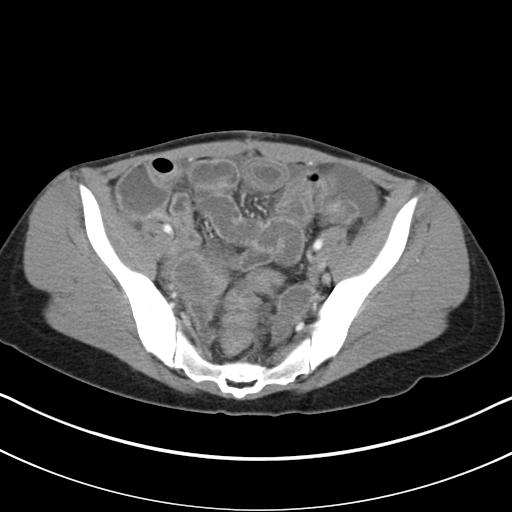
[im 37/83  soft-tissue]
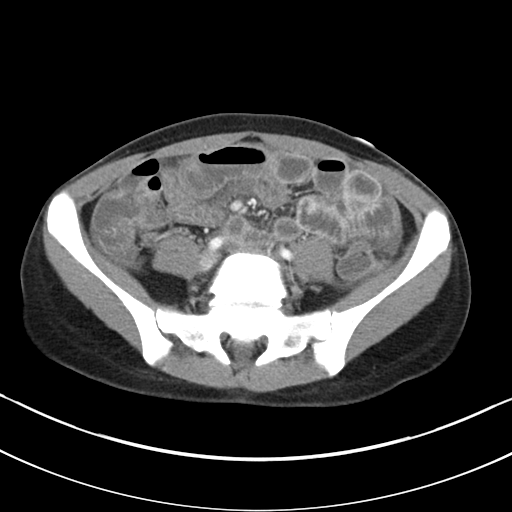
[im 42/83  soft-tissue]
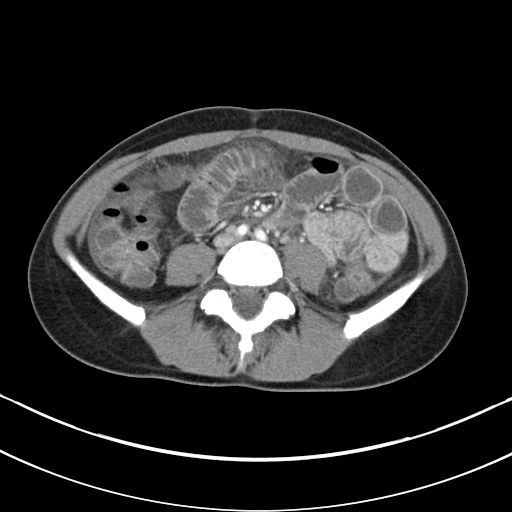
[im 46/83  soft-tissue]
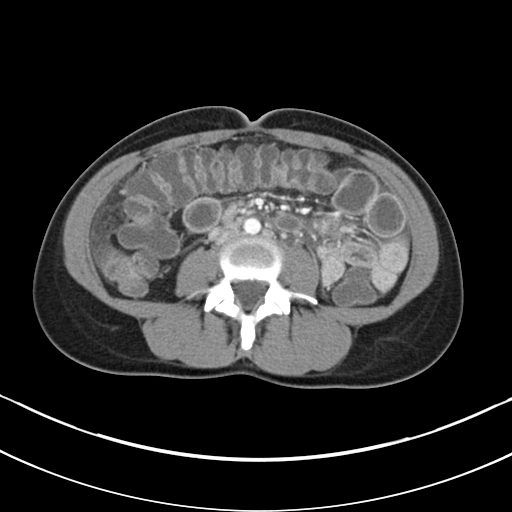
[im 54/83  soft-tissue]
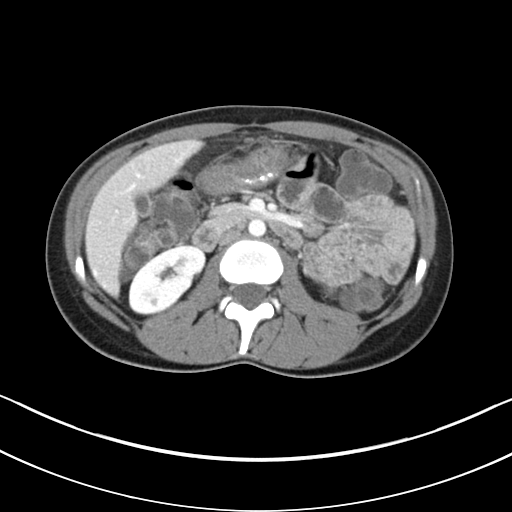
[im 54/83  bone]
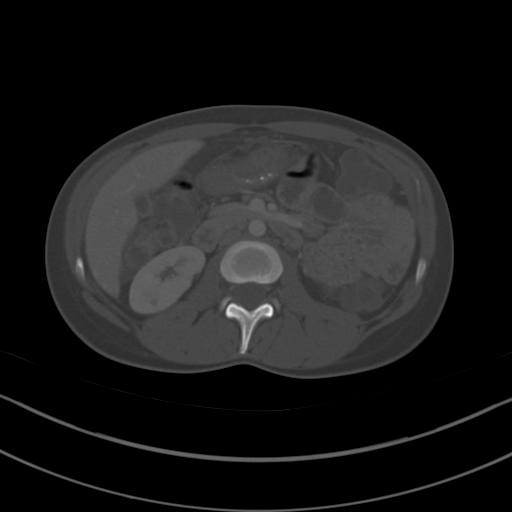
[im 58/83  soft-tissue]
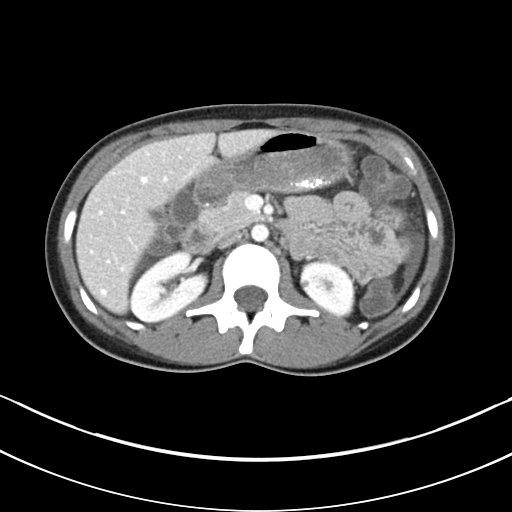
[im 66/83  soft-tissue]
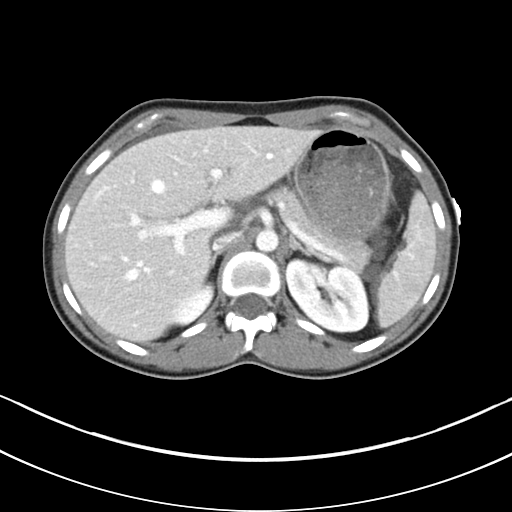
[im 70/83  soft-tissue]
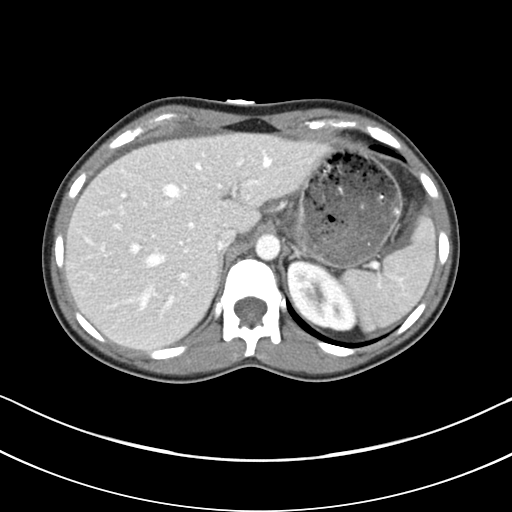
[im 78/83  soft-tissue]
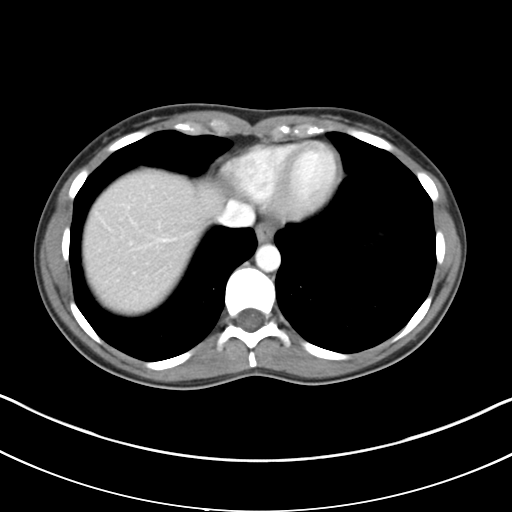

[Series 3: coronal a/|p · coronal · 0.71mm/px · 3 of 103 slices shown]
[im 35/103  soft-tissue]
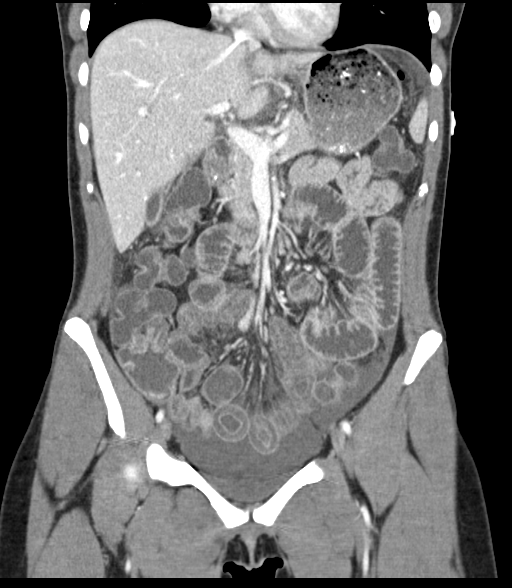
[im 46/103  soft-tissue]
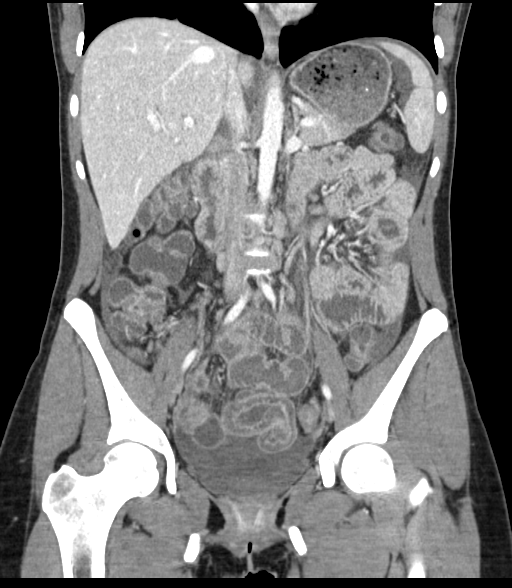
[im 57/103  soft-tissue]
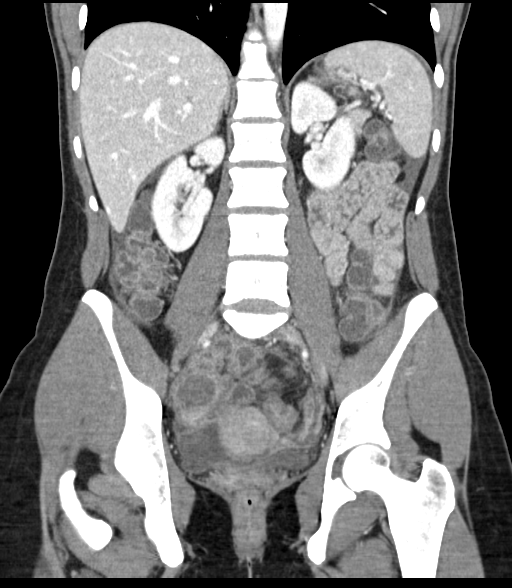

[16 of 46 positions shown; findings below may reference images not displayed]

FINDINGS: LOWER CHEST: Lung bases are clear. Included heart size is normal. No
pericardial effusion.

HEPATOBILIARY: Liver and gallbladder are normal.

PANCREAS: Normal.

SPLEEN: Normal.

ADRENALS/URINARY TRACT: Kidneys are orthotopic, demonstrating
symmetric enhancement. No nephrolithiasis, hydronephrosis or solid
renal masses. Too small to characterize hypodensities bilateral
kidneys. The unopacified ureters are normal in course and caliber.
Urinary bladder is partially distended and unremarkable. Normal
adrenal glands.

STOMACH/BOWEL: Fluid within the small and large bowel with short
segment of ileal circumferential wall thickening and edema. Mural
small and large bowel hyperemia with air fluid levels.

VASCULAR/LYMPHATIC: Aortoiliac vessels are normal in course and
caliber. Somewhat flattened inferior vena cava. No lymphadenopathy
by CT size criteria.

REPRODUCTIVE: 2.5 x 3.9 cm low-density RIGHT adnexa.

OTHER: Moderate amount of ascites. No intraperitoneal free air focal
fluid collection.

MUSCULOSKELETAL: Nonacute. Grade 2 L5-S1 anterolisthesis, chronic
segmental L5 pars interarticularis defects, severe L5-S1 neural
foraminal narrowing.
IMPRESSION: Severe enterocolitis, with short segment of severe ileal wall
thickening, differential diagnosis includes shock bowel, infectious
or inflammatory etiology less likely ischemic bowel considering
distribution though not excluded.

Moderate amount of ascites.

2.5 x 3.9 cm RIGHT adnexal residual endometrioma versus
benign-appearing cyst.

## 2018-05-08 DIAGNOSIS — O09811 Supervision of pregnancy resulting from assisted reproductive technology, first trimester: Secondary | ICD-10-CM | POA: Diagnosis not present

## 2018-05-08 DIAGNOSIS — Z36 Encounter for antenatal screening for chromosomal anomalies: Secondary | ICD-10-CM | POA: Diagnosis not present

## 2018-05-08 DIAGNOSIS — Z3689 Encounter for other specified antenatal screening: Secondary | ICD-10-CM | POA: Diagnosis not present

## 2018-05-13 DIAGNOSIS — Z3A12 12 weeks gestation of pregnancy: Secondary | ICD-10-CM | POA: Diagnosis not present

## 2018-05-13 DIAGNOSIS — Z3689 Encounter for other specified antenatal screening: Secondary | ICD-10-CM | POA: Diagnosis not present

## 2018-05-13 DIAGNOSIS — O09811 Supervision of pregnancy resulting from assisted reproductive technology, first trimester: Secondary | ICD-10-CM | POA: Diagnosis not present

## 2018-06-10 DIAGNOSIS — Z361 Encounter for antenatal screening for raised alphafetoprotein level: Secondary | ICD-10-CM | POA: Diagnosis not present

## 2018-06-10 DIAGNOSIS — O09812 Supervision of pregnancy resulting from assisted reproductive technology, second trimester: Secondary | ICD-10-CM | POA: Diagnosis not present

## 2018-06-10 DIAGNOSIS — Z3A16 16 weeks gestation of pregnancy: Secondary | ICD-10-CM | POA: Diagnosis not present

## 2018-07-01 DIAGNOSIS — Z3A19 19 weeks gestation of pregnancy: Secondary | ICD-10-CM | POA: Diagnosis not present

## 2018-07-01 DIAGNOSIS — O09812 Supervision of pregnancy resulting from assisted reproductive technology, second trimester: Secondary | ICD-10-CM | POA: Diagnosis not present

## 2018-07-01 DIAGNOSIS — Z363 Encounter for antenatal screening for malformations: Secondary | ICD-10-CM | POA: Diagnosis not present

## 2018-07-17 DIAGNOSIS — O09812 Supervision of pregnancy resulting from assisted reproductive technology, second trimester: Secondary | ICD-10-CM | POA: Diagnosis not present

## 2018-07-17 DIAGNOSIS — Z3A21 21 weeks gestation of pregnancy: Secondary | ICD-10-CM | POA: Diagnosis not present

## 2018-07-17 DIAGNOSIS — Z3686 Encounter for antenatal screening for cervical length: Secondary | ICD-10-CM | POA: Diagnosis not present

## 2018-07-26 DIAGNOSIS — N760 Acute vaginitis: Secondary | ICD-10-CM | POA: Diagnosis not present

## 2018-08-01 DIAGNOSIS — Z3482 Encounter for supervision of other normal pregnancy, second trimester: Secondary | ICD-10-CM | POA: Diagnosis not present

## 2018-08-01 DIAGNOSIS — O09812 Supervision of pregnancy resulting from assisted reproductive technology, second trimester: Secondary | ICD-10-CM | POA: Diagnosis not present

## 2018-08-01 DIAGNOSIS — Z3483 Encounter for supervision of other normal pregnancy, third trimester: Secondary | ICD-10-CM | POA: Diagnosis not present

## 2018-08-01 DIAGNOSIS — Z3A23 23 weeks gestation of pregnancy: Secondary | ICD-10-CM | POA: Diagnosis not present

## 2018-08-10 ENCOUNTER — Inpatient Hospital Stay (HOSPITAL_BASED_OUTPATIENT_CLINIC_OR_DEPARTMENT_OTHER): Payer: BLUE CROSS/BLUE SHIELD

## 2018-08-10 ENCOUNTER — Other Ambulatory Visit: Payer: Self-pay

## 2018-08-10 ENCOUNTER — Encounter: Payer: Self-pay | Admitting: Student

## 2018-08-10 ENCOUNTER — Inpatient Hospital Stay (HOSPITAL_COMMUNITY)
Admission: AD | Admit: 2018-08-10 | Discharge: 2018-08-13 | DRG: 833 | Disposition: A | Discharge status: Home / Self Care | Payer: BLUE CROSS/BLUE SHIELD | Attending: Obstetrics and Gynecology | Admitting: Obstetrics and Gynecology

## 2018-08-10 DIAGNOSIS — Z3A24 24 weeks gestation of pregnancy: Secondary | ICD-10-CM

## 2018-08-10 DIAGNOSIS — O4402 Placenta previa specified as without hemorrhage, second trimester: Secondary | ICD-10-CM

## 2018-08-10 DIAGNOSIS — R109 Unspecified abdominal pain: Secondary | ICD-10-CM

## 2018-08-10 DIAGNOSIS — O4412 Placenta previa with hemorrhage, second trimester: Principal | ICD-10-CM

## 2018-08-10 DIAGNOSIS — O99012 Anemia complicating pregnancy, second trimester: Secondary | ICD-10-CM

## 2018-08-10 DIAGNOSIS — O4692 Antepartum hemorrhage, unspecified, second trimester: Secondary | ICD-10-CM

## 2018-08-10 DIAGNOSIS — D649 Anemia, unspecified: Secondary | ICD-10-CM

## 2018-08-10 DIAGNOSIS — O26892 Other specified pregnancy related conditions, second trimester: Secondary | ICD-10-CM

## 2018-08-10 DIAGNOSIS — Z3A25 25 weeks gestation of pregnancy: Secondary | ICD-10-CM | POA: Diagnosis not present

## 2018-08-10 DIAGNOSIS — O4432 Partial placenta previa with hemorrhage, second trimester: Secondary | ICD-10-CM | POA: Diagnosis not present

## 2018-08-10 LAB — CBC
HCT: 34.2 % — ABNORMAL LOW (ref 36.0–46.0)
HCT: 32.8 % — ABNORMAL LOW (ref 36.0–46.0)
Hemoglobin: 11.6 g/dL — ABNORMAL LOW (ref 12.0–15.0)
Hemoglobin: 10.9 g/dL — ABNORMAL LOW (ref 12.0–15.0)
MCH: 32.6 pg (ref 26.0–34.0)
MCH: 32.2 pg (ref 26.0–34.0)
MCHC: 33.9 g/dL (ref 30.0–36.0)
MCHC: 33.2 g/dL (ref 30.0–36.0)
MCV: 96.1 fL (ref 78.0–100.0)
MCV: 96.8 fL (ref 78.0–100.0)
Platelets: 288 10*3/uL (ref 150–400)
Platelets: 276 10*3/uL (ref 150–400)
RBC: 3.56 MIL/uL — ABNORMAL LOW (ref 3.87–5.11)
RBC: 3.39 MIL/uL — ABNORMAL LOW (ref 3.87–5.11)
RDW: 14.3 % (ref 11.5–15.5)
RDW: 14.2 % (ref 11.5–15.5)
WBC: 13.8 10*3/uL — ABNORMAL HIGH (ref 4.0–10.5)
WBC: 14.1 10*3/uL — ABNORMAL HIGH (ref 4.0–10.5)

## 2018-08-10 LAB — ABO/RH: ABO/RH(D): A POS

## 2018-08-10 LAB — TYPE AND SCREEN
ABO/RH(D): A POS
Antibody Screen: NEGATIVE

## 2018-08-10 MED ORDER — ACETAMINOPHEN 325 MG PO TABS
650.0000 mg | ORAL_TABLET | ORAL | Status: DC | PRN
  Administered 2018-08-10 – 2018-08-11 (×3): 650 mg via ORAL
  Filled 2018-08-10 (×4): qty 2

## 2018-08-10 MED ORDER — ZOLPIDEM TARTRATE 5 MG PO TABS: 5.0000 mg | ORAL_TABLET | Freq: Every evening | ORAL | Status: DC | PRN

## 2018-08-10 MED ORDER — DOCUSATE SODIUM 100 MG PO CAPS
100.0000 mg | ORAL_CAPSULE | Freq: Every day | ORAL | Status: DC
  Administered 2018-08-10 – 2018-08-12 (×3): 100 mg via ORAL
  Filled 2018-08-10 (×4): qty 1

## 2018-08-10 MED ORDER — NIFEDIPINE 10 MG PO CAPS
20.0000 mg | ORAL_CAPSULE | Freq: Once | ORAL | Status: AC
  Administered 2018-08-10: 20 mg via ORAL
  Filled 2018-08-10: qty 2

## 2018-08-10 MED ORDER — BETAMETHASONE SOD PHOS & ACET 6 (3-3) MG/ML IJ SUSP
12.0000 mg | Freq: Once | INTRAMUSCULAR | Status: AC
  Administered 2018-08-11: 12 mg via INTRAMUSCULAR
  Filled 2018-08-10 (×2): qty 2

## 2018-08-10 MED ORDER — CALCIUM CARBONATE ANTACID 500 MG PO CHEW: 2.0000 | CHEWABLE_TABLET | ORAL | Status: DC | PRN

## 2018-08-10 MED ORDER — BETAMETHASONE SOD PHOS & ACET 6 (3-3) MG/ML IJ SUSP
12.0000 mg | Freq: Once | INTRAMUSCULAR | Status: AC
  Administered 2018-08-10: 12 mg via INTRAMUSCULAR
  Filled 2018-08-10: qty 2

## 2018-08-10 MED ORDER — NIFEDIPINE 10 MG PO CAPS
20.0000 mg | ORAL_CAPSULE | Freq: Four times a day (QID) | ORAL | Status: DC
  Administered 2018-08-10 – 2018-08-12 (×8): 20 mg via ORAL
  Filled 2018-08-10 (×8): qty 2

## 2018-08-10 MED ORDER — LACTATED RINGERS IV SOLN
INTRAVENOUS | Status: DC
  Administered 2018-08-10 – 2018-08-11 (×4): via INTRAVENOUS

## 2018-08-10 MED ORDER — LACTATED RINGERS IV BOLUS: 1000.0000 mL | Freq: Once | INTRAVENOUS | Status: DC

## 2018-08-10 MED ORDER — PRENATAL MULTIVITAMIN CH
1.0000 | ORAL_TABLET | Freq: Every day | ORAL | Status: DC
  Administered 2018-08-10 – 2018-08-12 (×3): 1 via ORAL
  Filled 2018-08-10 (×4): qty 1

## 2018-08-10 NOTE — H&P (Signed)
Jacqueline [name] is a 27 y.o. G2P0010 at [redacted]w[redacted]d presenting for vaginal bleeding. Known previa, no prior bleeds. Last pm was resting when noticed abdominal cramping then bleeding with wiping.  Good FM, no LOF. Currently since BMZ and Procardia, pt notes flushing after Procardia with mild HA, Still with abdominal cramping. No bleeding but brown d/c.   [facility] at [facility] since 1st trimester. - IVF preg, FET, endometriosis   Prenatal Transfer Tool  Maternal Diabetes: No Genetic Screening: Normal Maternal Ultrasounds/Referrals: Normal Fetal Ultrasounds or other Referrals:  None Maternal Substance Abuse:  No Significant Maternal Medications:  None Significant Maternal Lab Results: None     OB History    Gravida  2   Para  0   Term  0   Preterm  0   AB  1   Living  0     SAB  1   TAB  0   Ectopic  0   Multiple  0   Live Births             Past Medical History:  Diagnosis Date  . Allergic rhinitis   . Endometriosis   . Thyroid nodule    Past Surgical History:  Procedure Laterality Date  . CHROMOPERTUBATION Bilateral 03/20/2017   Procedure: CHROMOPERTUBATION;  Surgeon: Megan Salon, MD;  Location: [facility];  Service: Gynecology;  Laterality: Bilateral;  Fallopian tubes  . CYSTOSCOPY N/A 03/20/2017   Procedure: CYSTOSCOPY;  Surgeon: Megan Salon, MD;  Location: [facility];  Service: Gynecology;  Laterality: N/A;  . FOOT SURGERY Left    neuromona  . LAPAROSCOPIC OVARIAN CYSTECTOMY Bilateral 03/20/2017   Procedure: LAPAROSCOPIC OVARIAN CYSTECTOMY;  Surgeon: Megan Salon, MD;  Location: [facility];  Service: Gynecology;  Laterality: Bilateral;  . LAPAROSCOPIC UNILATERAL SALPINGECTOMY Right 03/20/2017   Procedure: LAPAROSCOPIC UNILATERAL SALPINGECTOMY;  Surgeon: Megan Salon, MD;  Location: [facility];  Service: Gynecology;  Laterality: Right;  . LAPAROSCOPY Bilateral 03/20/2017   Procedure: LAPAROSCOPY OPERATIVE  WITH LYSIS OF ADHESIONS;  Surgeon: Megan Salon, MD;   Location: [facility];  Service: Gynecology;  Laterality: Bilateral;  . WISDOM TOOTH EXTRACTION     Family History: family history includes Breast cancer (age of onset: 104) in her mother. Social History:  reports that she has never smoked. She has never used smokeless tobacco. She reports that she drinks about 1.0 - 3.0 standard drinks of alcohol per week. She reports that she does not use drugs.  Review of Systems - Negative except bleeding     Blood pressure 94/64, pulse 95, temperature 98.2 F (36.8 C), temperature source Oral, resp. rate 18, height 5\' 2"  (1.575 m), weight 64 kg, last menstrual period 12/17/2017, SpO2 100 %.  Vitals:   08/10/18 0310 08/10/18 0348 08/10/18 0350 08/10/18 0749  BP: 112/69 117/76  94/64  Pulse:  100  95  Resp:  16  18  Temp:  97.8 F (36.6 C)  98.2 F (36.8 C)  TempSrc:  Oral  Oral  SpO2:  100%  100%  Weight:   64 kg   Height:   5\' 2"  (1.575 m)     Physical Exam:  Gen: well appearing, no distress CV: RRR Pulm: CTAB Back: no CVAT Abd: gravid, NT, no RUQ pain LE: no edema, equal bilaterally, non-tender Toco: irritibitlity FH: baseline 125s, accelerations present, no deceleratons, 10 beat variability  Prenatal labs: ABO, Rh: --/--/A POS, A POS Performed at [facility], [address] [place], [place] [address]  (  10/05 0227) Antibody: NEG (10/05 0227) Rubella:  Immune RPR:   NR HBsAg:   neg HIV:   neg GBS:   not yet done 1 hr Glucola not yet done  Genetic screening nl NIPS,n l AFP Anatomy US nl anatomy  CBC    Component Value Date/Time   WBC 13.8 (H) 08/10/2018 0227   RBC 3.56 (L) 08/10/2018 0227   HGB 11.6 (L) 08/10/2018 0227   HCT 34.2 (L) 08/10/2018 0227   PLT 288 08/10/2018 0227   MCV 96.1 08/10/2018 0227   MCH 32.6 08/10/2018 0227   MCHC 33.9 08/10/2018 0227   RDW 14.3 08/10/2018 0227   LYMPHSABS 1.6 03/29/2017 0617   MONOABS 0.7 03/29/2017 0617   EOSABS 0.7 03/29/2017 0617   BASOSABS 0.0 03/29/2017 0617    U/s: CL  4.1cm, placenta previa, complete   Assessment/Plan: 27 y.o. G2P0010 at [redacted]w[redacted]d Placenta previa, first bleed. Stable. NPO o/n, OK to eat now, cont IV access, Procardia for irritibiilty. Risks of PCS d/w pt.  - Reactive fetal testing, continuous monitoring x 24 hrs, BMZ. NICU full.   Ala Dach 08/10/2018, 8:39 AM

## 2018-08-10 NOTE — MAU Note (Signed)
Was sitting on sofa just before coming in and felt fld come out which I have told them about at office. Went to [facility] and saw small amt blood on tissue and panties. Some abd cramping. Good FM and have doppler had home and heard FHR

## 2018-08-10 NOTE — MAU Provider Note (Signed)
Chief Complaint:  Vaginal Bleeding and Abdominal Pain   First Provider Initiated Contact with Patient 08/10/18 0208     HPI: Jacqueline [name] is a 27 y.o. G2P0010 at [redacted]w[redacted]d who presents to maternity admissions reporting vaginal bleeding and abdominal pain. Pregnancy complicated by complete placenta previa.  This is her first bleeding episode this pregnancy. Woke up feeling leaking, went to the bathroom and there was blood in the toilet. Is not saturating pads but is having to wear a pad & passed quarter sized blood clot when she came to MAU. Abdominal cramping started around the same time as bleeding. No intercourse.   Location: lower abdomen Quality: cramping Severity: 3/10 in pain scale Duration: 2 hours Timing: intermittent Modifying factors: none Associated signs and symptoms: vaginal bleeding   Past Medical History:  Diagnosis Date  . Allergic rhinitis   . Endometriosis   . Thyroid nodule    OB History  Gravida Para Term Preterm AB Living  2 0 0 0 1 0  SAB TAB Ectopic Multiple Live Births  1 0 0 0      # Outcome Date GA Lbr Len/2nd Weight Sex Delivery Anes PTL Lv  2 Current           1 SAB 07/2016           Past Surgical History:  Procedure Laterality Date  . CHROMOPERTUBATION Bilateral 03/20/2017   Procedure: CHROMOPERTUBATION;  Surgeon: Megan Salon, MD;  Location: [facility];  Service: Gynecology;  Laterality: Bilateral;  Fallopian tubes  . CYSTOSCOPY N/A 03/20/2017   Procedure: CYSTOSCOPY;  Surgeon: Megan Salon, MD;  Location: [facility];  Service: Gynecology;  Laterality: N/A;  . FOOT SURGERY Left    neuromona  . LAPAROSCOPIC OVARIAN CYSTECTOMY Bilateral 03/20/2017   Procedure: LAPAROSCOPIC OVARIAN CYSTECTOMY;  Surgeon: Megan Salon, MD;  Location: [facility];  Service: Gynecology;  Laterality: Bilateral;  . LAPAROSCOPIC UNILATERAL SALPINGECTOMY Right 03/20/2017   Procedure: LAPAROSCOPIC UNILATERAL SALPINGECTOMY;  Surgeon: Megan Salon, MD;  Location: [facility];  Service:  Gynecology;  Laterality: Right;  . LAPAROSCOPY Bilateral 03/20/2017   Procedure: LAPAROSCOPY OPERATIVE  WITH LYSIS OF ADHESIONS;  Surgeon: Megan Salon, MD;  Location: [facility];  Service: Gynecology;  Laterality: Bilateral;  . WISDOM TOOTH EXTRACTION     Family History  Problem Relation Age of Onset  . Breast cancer Mother 32        mastectomy  . Thyroid disease Neg Hx    Social History   Tobacco Use  . Smoking status: Never Smoker  . Smokeless tobacco: Never Used  Substance Use Topics  . Alcohol use: Yes    Alcohol/week: 1.0 - 3.0 standard drinks    Types: 1 - 3 Standard drinks or equivalent per week    Comment: occasional   . Drug use: No   Allergies  Allergen Reactions  . Shellfish Allergy Anaphylaxis  . Amoxicillin-Pot Clavulanate Other (See Comments)    Does not remember  . Ciprofloxacin     Caused QT prolongation   Medications Prior to Admission  Medication Sig Dispense Refill Last Dose  . loratadine (CLARITIN) 10 MG tablet Take 10 mg by mouth daily.   08/09/2018 at Unknown time  . Prenatal Vit-Fe Fumarate-FA (PRENATAL MULTIVITAMIN) TABS tablet Take 1 tablet by mouth daily at 12 noon.   08/09/2018 at Unknown time  . amoxicillin (AMOXIL) 500 MG capsule Take 1 capsule (500 mg total) by mouth 3 (three) times daily. (Patient not taking: Reported on  02/20/2018) 21 capsule 0 Not Taking at Unknown time  . aspirin 81 MG chewable tablet Chew 81 mg by mouth daily.   02/19/2018 at Unknown time  . B-D 3CC LUER-LOK SYR 18GX1-1/2 18G X 1-1/2" 3 ML MISC USE ONLY TO DRAW UP MEDICATION  CHANGE TO APPROPRIATE NEEDLE TO INJECT  2 Unknown  . BD DISP NEEDLES 22G X 1-1/2" MISC See admin instructions.  3 Unknown  . BD DISP NEEDLES 30G X 1/2" MISC See admin instructions.  2 Unknown  . dicyclomine (BENTYL) 20 MG tablet Take 1 tablet (20 mg total) by mouth every 12 (twelve) hours as needed (for abdominal pain/cramping). 20 tablet 0   . estradiol (ESTRACE) 2 MG tablet Take 2 mg by mouth 2 (two) times  daily.  3 02/19/2018 at Unknown time  . estradiol (VIVELLE-DOT) 0.1 MG/24HR patch APPLY 1 PATCH TO SKIN EVERY 3 DAYS  3 02/19/2018 at Unknown time  . GONAL-F 1050 units SOLR MIX AND INJECT 225 UNITS SUB CUTANEOUSLY EVERY DAY  1 Completed Course at Unknown time  . MENOPUR 75 units SOLR MIX AND INJECT 75 UNITS SUB CUTANEOUSLY EVERY DAY  1 Completed Course at Unknown time  . methylPREDNISolone (MEDROL) 8 MG tablet Take 8 mg by mouth 2 (two) times daily.  0 Completed Course at Unknown time  . metroNIDAZOLE (FLAGYL) 500 MG tablet Take 1 tablet (500 mg total) by mouth 3 (three) times daily. 21 tablet 0   . NOVAREL 5000 units SOLR MIX 2 VIALS OF POWDER WITH 1ML OF DILUENT AND INJECT RESULTANT MIXTURE  10,000 UNITS  FOR TRIGGER AS DIRECTED  1 Completed Course at Unknown time  . phenol (CHLORASEPTIC) 1.4 % LIQD Use as directed 1 spray in the mouth or throat as needed for throat irritation / pain. (Patient not taking: Reported on 02/20/2018) 1 Bottle 0 Not Taking at Unknown time  . progesterone 50 MG/ML injection INJECT 1ML INTRA MUSCULARLY EVERY DAY AS DIRECTED  STORE AT ROOM TEMPERATURE  3 Completed Course at Unknown time  . SYNAREL 2 MG/ML SOLN USE 1 SPRAY IN ALTERNATING NOSTRILS TWICE A DAY  3 Past Month at Unknown time    I have reviewed patient's Past Medical Hx, Surgical Hx, Family Hx, Social Hx, medications and allergies.   ROS:  Review of Systems  Constitutional: Negative.   Gastrointestinal: Positive for abdominal pain.  Genitourinary: Positive for vaginal bleeding.    Physical Exam   Patient Vitals for the past 24 hrs:  BP Temp Pulse Resp  08/10/18 0310 112/69 - - -  08/10/18 0154 116/71 98 F (36.7 C) 85 18    Constitutional: Well-developed, well-nourished female in no acute distress.  Cardiovascular: normal rate & rhythm, no murmur Respiratory: normal effort, lung sounds clear throughout GI: Abd soft, non-tender, gravid appropriate for gestational age. Pos BS x 4 MS: Extremities  nontender, no edema, normal ROM Neurologic: Alert and oriented x 4.  GU:  Spec exam: difficult to visualize cervix d/t pooling of dark red blood. Digital exam deferred.   NST:  Baseline: 140 bpm, Variability: Good {> 6 bpm), Accelerations: Non-reactive but appropriate for gestational age, Decelerations: Absent and ctx every 2-3 minutes   Labs: Results for orders placed or performed during the hospital encounter of 08/10/18 (from the past 24 hour(s))  CBC     Status: Abnormal   Collection Time: 08/10/18  2:27 AM  Result Value Ref Range   WBC 13.8 (H) 4.0 - 10.5 K/uL   RBC 3.56 (L) 3.87 -  5.11 MIL/uL   Hemoglobin 11.6 (L) 12.0 - 15.0 g/dL   HCT 34.2 (L) 36.0 - 46.0 %   MCV 96.1 78.0 - 100.0 fL   MCH 32.6 26.0 - 34.0 pg   MCHC 33.9 30.0 - 36.0 g/dL   RDW 14.3 11.5 - 15.5 %   Platelets 288 150 - 400 K/uL  Type and screen     Status: None (Preliminary result)   Collection Time: 08/10/18  2:27 AM  Result Value Ref Range   ABO/RH(D) A POS    Antibody Screen PENDING    Sample Expiration      08/13/2018 Performed at [facility], [address], [place], [place] [address]     Imaging:  No results found.  MAU Course: Orders Placed This Encounter  Procedures  . [place] MFM OB LIMITED  . CBC  . Diet NPO time specified  . Type and screen   Meds ordered this encounter  Medications  . lactated ringers bolus 1,000 mL  . betamethasone acetate-betamethasone sodium phosphate (CELESTONE) injection 12 mg  . lactated ringers infusion  . NIFEdipine (PROCARDIA) capsule 20 mg    MDM: Fetal monitoring reassuring for gestational age. Ctx every 2-3 minutes, decreased with IV fluid bolus.  RH positive Ultrasound confirms continued complete previa. CL 4 cm C/w Dr. Harolyn Rutherford. Pt needs admission for VB w/previa.  Drs. [place] aware of patient.    Assessment: 1. Vaginal bleeding in pregnancy, second trimester   2. [redacted] weeks gestation of pregnancy   3. Placenta previa antepartum in  second trimester   4. Abdominal pain during pregnancy, second trimester     Plan: Admit to antenatal unit -- okayed by NICU per Dr. Pamala Hurry Admission orders placed First dose of BMZ given in MAU 10/5 @ [place], [place], NP 08/10/2018 3:24 AM

## 2018-08-11 NOTE — Progress Notes (Signed)
HD #2 24wks 6d Previa- 1st bleedi  Pt notes mild cramping yesterday, single large cramp last night when bladder full, no cramping this am. No further bleeding. Good FM. Continued occ brown dc. Good FM. No LOF. NO pain or contractions noted  PE:  Vitals:   08/11/18 1912 08/11/18 1930  BP: 96/62   Pulse: (!) 110   Resp: 17 19  Temp: 97.8 F (36.6 C)   SpO2: 98%    Gen: well appearing, no distress Neck : supple with FORM Lgs: CTA CV: RRR Abd: gravid, NT LE: no edema GU: exam deferred, very scant brown d/c on pad Neuro: nonfocal Skin: intact  Toco: irritability. FH: 135s, + accels, no decels, 10 beat var  A/P:  24w 6d IUP Complete Previa- first bleed. Stable. UI on Procardia IVF pregnancy Continue inpt management BMZ #2 today done  Miaa Latterell J 08/11/2018 8:27 PM

## 2018-08-11 NOTE — Progress Notes (Signed)
Monitors removed.  Patient is now intermittent monitoring.

## 2018-08-11 NOTE — Progress Notes (Signed)
HD #2  Pt notes mild cramping yesterday, single large cramp last night when bladder full, no cramping this am. No further bleeding. Good FM.  Multiple questions today.  PE:  Vitals:   08/10/18 1916 08/11/18 0810  BP: 94/63 100/61  Pulse: 83 93  Resp: 18 18  Temp: 98.6 F (37 C) 98.5 F (36.9 C)  SpO2: 100%    Gen: well appearing, no distress Abd: gravid, NT LE: no edema GU: exam deferred, very scant brown d/c on pad  Toco: irritbility. FH: 135s, + accels, no decels, 10 beat var  A/P: 27 yo G2P0 at 24'6 with first bleed from placenta previa. - 2nd BMZ last night, still on Procardia, - stable,  consider d/c in am. Hep lock IV - Reactive fetal testing, move to tid monitoring.  Ala Dach 08/11/2018 10:26 AM

## 2018-08-12 MED ORDER — SODIUM CHLORIDE 0.9 % IV SOLN
510.0000 mg | Freq: Once | INTRAVENOUS | Status: AC
  Administered 2018-08-12: 510 mg via INTRAVENOUS
  Filled 2018-08-12: qty 17

## 2018-08-12 NOTE — Progress Notes (Signed)
Patient's mother come to desk concerned due to patient's HR is racing, approx. 130's bpm. Pt assessed, asymptomatic, patient stated she felt better, that it was only for a moment. Will continue to monitor.

## 2018-08-12 NOTE — Progress Notes (Addendum)
HD # 3 25wks 0d Previa- 1st bleed  Pt notes mild cramping yesterday, sno cramping this am. No further bleeding. Good FM. Continued occ brown dc. Good FM. No LOF. NO pain or contractions noted  PE:  Vitals:   08/11/18 1930 08/11/18 2246  BP:  93/60  Pulse:  75  Resp: 19 18  Temp:    SpO2:  97%   Gen: well appearing, no distress Neck : supple with FORM Lgs: CTA CV: RRR Abd: gravid, NT LE: no edema GU: exam deferred, very scant brown d/c on pad Neuro: nonfocal Skin: intact  Toco: irritability. FH: 135s, + accels, no decels, 10 beat variability Reassuring FHR x 3 . NST x 3.  A/P:  25w 0d IUP Complete Previa- first bleed. Stable. UI on Procardia Gestational anemia with previa IV Iron DC Procardia IVF pregnancy Continue inpt management BMZ #2 today done  Alwaleed Obeso J 08/12/2018 6:39 AM

## 2018-08-13 LAB — TYPE AND SCREEN
ABO/RH(D): A POS
Antibody Screen: NEGATIVE

## 2018-08-13 NOTE — Progress Notes (Signed)
HD # 4 25wks 1d Previa- 1st bleed  Pt notes mild cramping yesterday, sno cramping this am. No further bleeding. Good FM. Continued occ brown dc. Good FM. No LOF. NO pain or contractions noted  PE:  Vitals:   08/12/18 2343 08/13/18 0411  BP: 100/60 96/60  Pulse: 67 66  Resp: 16 16  Temp: 98.2 F (36.8 C) 98.2 F (36.8 C)  SpO2: 98% 99%   Gen: well appearing, no distress Neck : supple with FORM Lgs: CTA CV: RRR Abd: gravid, NT LE: no edema GU: exam deferred, very scant brown d/c on pad Neuro: nonfocal Skin: intact  Toco: irritability. FH: 135s, + accels, no decels, 10 beat variability Reassuring FHR x 3 . NST x 3.  A/P:  25w 1d IUP Complete Previa- first bleed. Stable. UI on Procardia Gestational anemia with previa IV Iron DC Procardia IVF pregnancy DC home today BMZ  done  Jacqueline [name] J 08/13/2018 6:29 AM

## 2018-08-14 NOTE — Discharge Summary (Signed)
NAMEMAISA, [place] MEDICAL RECORD [id number] ACCOUNT [id number] DATE OF BIRTH:07/17/1991 FACILITY: [facility] LOCATION: [id number] PHYSICIAN:Lupie Sawa J. Ronita Hipps, MD  DISCHARGE SUMMARY  DATE OF DISCHARGE:  08/13/2018  ADMITTING DIAGNOSES:  Placenta previa 24 weeks bleeding.  DISCHARGE DIAGNOSIS.  Placenta previa  24 weeks bleeding.  HOSPITAL COURSE:  The patient was admitted on this admission observation for bleeding placenta previa at 24 weeks.  She received betamethasone course.  Uterine irritability was treated with Procardia for 48 hours and discontinued.  Fetal heart rate was  reassuring.  Fetal ultrasound revealing an appropriately grown fetus with normal fluid and a complete placenta previa.  After no bleeding for 72 hours, the patient was discharged home with strict precautions, no work.  DISCHARGE MEDICATIONS:  Included prenatal vitamins and Procardia as needed.  She will follow up in the office in 1 week.  Discharge teaching was done.  AN/NUANCE D:08/14/2018 T:08/14/2018 JOB:[id number]

## 2018-08-16 ENCOUNTER — Inpatient Hospital Stay (HOSPITAL_COMMUNITY)
Admission: AD | Admit: 2018-08-16 | Discharge: 2018-08-16 | Disposition: A | Discharge status: Home / Self Care | Payer: BLUE CROSS/BLUE SHIELD | Source: Ambulatory Visit | Attending: Obstetrics & Gynecology | Admitting: Obstetrics & Gynecology

## 2018-08-16 ENCOUNTER — Encounter (HOSPITAL_COMMUNITY): Payer: Self-pay

## 2018-08-16 DIAGNOSIS — O4412 Placenta previa with hemorrhage, second trimester: Principal | ICD-10-CM

## 2018-08-16 DIAGNOSIS — Z880 Allergy status to penicillin: Secondary | ICD-10-CM

## 2018-08-16 DIAGNOSIS — O4692 Antepartum hemorrhage, unspecified, second trimester: Secondary | ICD-10-CM

## 2018-08-16 DIAGNOSIS — Z3A25 25 weeks gestation of pregnancy: Secondary | ICD-10-CM

## 2018-08-16 DIAGNOSIS — O4402 Placenta previa specified as without hemorrhage, second trimester: Secondary | ICD-10-CM

## 2018-08-16 DIAGNOSIS — O209 Hemorrhage in early pregnancy, unspecified: Secondary | ICD-10-CM | POA: Diagnosis not present

## 2018-08-16 NOTE — MAU Provider Note (Signed)
Chief Complaint:  Vaginal Bleeding   First Provider Initiated Contact with Patient 08/16/18 0034     HPI: Jacqueline [name] is a 27 y.o. G2P0010 at 95w4dwho presents to maternity admissions reporting vaginal bleeding of reddish brown color this evening at home. Has not noticed any contractions.  Was discharged home after being observed for a placenta previa (complete) on 08/13/18.  Had no bleeding yesterday or today.   She reports good fetal movement, denies LOF, vaginal itching/burning, urinary symptoms, h/a, dizziness, n/v, diarrhea, constipation or fever/chills.   Vaginal Bleeding  The patient's primary symptoms include vaginal bleeding. The patient's pertinent negatives include no genital itching, genital lesions, genital odor or pelvic pain. This is a recurrent problem. The current episode started today. The problem occurs intermittently. The patient is experiencing no pain. She is pregnant. Pertinent negatives include no abdominal pain, back pain, chills, constipation, diarrhea, fever, frequency, nausea or vomiting. The vaginal discharge was bloody, dark and brown. She has not been passing clots. She has not been passing tissue. Nothing aggravates the symptoms. She has tried nothing for the symptoms.    RN Note: Bleeding stopped while she was admitted then stopped.  Tonight noticed some rust colored blood in her underwear at 2345.  + FM.  No LOF.  Not feeling any ctx  Past Medical History: Past Medical History:  Diagnosis Date  . Allergic rhinitis   . Endometriosis   . Thyroid nodule     Past obstetric history: OB History  Gravida Para Term Preterm AB Living  2 0 0 0 1 0  SAB TAB Ectopic Multiple Live Births  1 0 0 0      # Outcome Date GA Lbr Len/2nd Weight Sex Delivery Anes PTL Lv  2 Current           1 SAB 07/2016            Past Surgical History: Past Surgical History:  Procedure Laterality Date  . CHROMOPERTUBATION Bilateral 03/20/2017   Procedure: CHROMOPERTUBATION;   Surgeon: Megan Salon, MD;  Location: [facility];  Service: Gynecology;  Laterality: Bilateral;  Fallopian tubes  . CYSTOSCOPY N/A 03/20/2017   Procedure: CYSTOSCOPY;  Surgeon: Megan Salon, MD;  Location: [facility];  Service: Gynecology;  Laterality: N/A;  . FOOT SURGERY Left    neuromona  . LAPAROSCOPIC OVARIAN CYSTECTOMY Bilateral 03/20/2017   Procedure: LAPAROSCOPIC OVARIAN CYSTECTOMY;  Surgeon: Megan Salon, MD;  Location: [facility];  Service: Gynecology;  Laterality: Bilateral;  . LAPAROSCOPIC UNILATERAL SALPINGECTOMY Right 03/20/2017   Procedure: LAPAROSCOPIC UNILATERAL SALPINGECTOMY;  Surgeon: Megan Salon, MD;  Location: [facility];  Service: Gynecology;  Laterality: Right;  . LAPAROSCOPY Bilateral 03/20/2017   Procedure: LAPAROSCOPY OPERATIVE  WITH LYSIS OF ADHESIONS;  Surgeon: Megan Salon, MD;  Location: [facility];  Service: Gynecology;  Laterality: Bilateral;  . WISDOM TOOTH EXTRACTION      Family History: Family History  Problem Relation Age of Onset  . Breast cancer Mother 52        mastectomy  . Thyroid disease Neg Hx     Social History: Social History   Tobacco Use  . Smoking status: Never Smoker  . Smokeless tobacco: Never Used  Substance Use Topics  . Alcohol use: Yes    Alcohol/week: 1.0 - 3.0 standard drinks    Types: 1 - 3 Standard drinks or equivalent per week    Comment: occasional   . Drug use: No    Allergies:  Allergies  Allergen Reactions  . Shellfish Allergy Anaphylaxis  . Amoxicillin-Pot Clavulanate Other (See Comments)    Does not remember  . Ciprofloxacin     Caused QT prolongation    Meds:  Medications Prior to Admission  Medication Sig Dispense Refill Last Dose  . loratadine (CLARITIN) 10 MG tablet Take 10 mg by mouth daily.   Past Week at Unknown time  . Prenatal Vit-Fe Fumarate-FA (PRENATAL MULTIVITAMIN) TABS tablet Take 1 tablet by mouth daily at 12 noon.   08/09/2018 at Unknown time    I have reviewed patient's Past Medical Hx, Surgical  Hx, Family Hx, Social Hx, medications and allergies.   ROS:  Review of Systems  Constitutional: Negative for chills and fever.  Gastrointestinal: Negative for abdominal pain, constipation, diarrhea, nausea and vomiting.  Genitourinary: Positive for vaginal bleeding. Negative for frequency and pelvic pain.  Musculoskeletal: Negative for back pain.   Other systems negative  Physical Exam   Patient Vitals for the past 24 hrs:  BP Temp Pulse Resp SpO2 Height Weight  08/16/18 0023 101/62 98.9 F (37.2 C) 94 19 99 % - -  08/16/18 0015 - - - - - 5\' 2"  (1.575 m) 64.2 kg   Constitutional: Well-developed, well-nourished female in no acute distress.  Cardiovascular: normal rate and rhythm Respiratory: normal effort, clear to auscultation bilaterally GI: Abd soft, non-tender, gravid appropriate for gestational age.   No rebound or guarding. MS: Extremities nontender, no edema, normal ROM Neurologic: Alert and oriented x 4.  GU: Neg CVAT.  PELVIC EXAM: Cervix pink, visually closed, without lesion, small amount of brownish red mucous discharge in vault      FHT:  Baseline 140 , moderate variability, accelerations present, no decelerations Contractions:  Irregular  Rare   Labs: --/--/A POS (10/08 4081)  Imaging:    MAU Course/MDM: NST reviewed and is reassuring with only rare contractions Consult Dr Rip Harbour and Dr Benjie Karvonen with presentation, exam findings.  Dr Benjie Karvonen suggested I page Dr Ronita Hipps since he is the primary.  She states if he does not call back, she recommends discharge home with followup in office on Monday morning.  Strict bleeding precautions I suggested pt call office today to schedule for Monday morning Treatments in MAU included EFM, exam.    Assessment: Single intrauterine pregnancy at [redacted]w[redacted]d Complete placenta previa Vaginal bleeding, second episode  Plan: Discharge home Bleeding precautions, strict Pelvic rest, advised limiting strenuous activity for now Preterm  Labor precautions and fetal kick counts Follow up in Office for prenatal visits and recheck of status  Encouraged to return here or to other Urgent Care/ED if she develops worsening of symptoms, increase in pain, fever, or other concerning symptoms.   Pt stable at time of discharge.  Hansel Feinstein CNM, MSN Certified Nurse-Midwife 08/16/2018 12:34 AM

## 2018-08-16 NOTE — Discharge Instructions (Signed)
Vaginal Bleeding During Pregnancy, Second Trimester A small amount of bleeding (spotting) from the vagina is common in pregnancy. Sometimes the bleeding is normal and is not a problem, and sometimes it is a sign of something serious. Be sure to tell your doctor about any bleeding from your vagina right away. Follow these instructions at home:  Watch your condition for any changes.  Follow your doctor's instructions about how active you can be.  If you are on bed rest: ? You may need to stay in bed and only get up to use the bathroom. ? You may be allowed to do some activities. ? If you need help, make plans for someone to help you.  Write down: ? The number of pads you use each day. ? How often you change pads. ? How soaked (saturated) your pads are.  Do not use tampons.  Do not douche.  Do not have sex or orgasms until your doctor says it is okay.  If you pass any tissue from your vagina, save the tissue so you can show it to your doctor.  Only take medicines as told by your doctor.  Do not take aspirin because it can make you bleed.  Do not exercise, lift heavy weights, or do any activities that take a lot of energy and effort unless your doctor says it is okay.  Keep all follow-up visits as told by your doctor. Contact a doctor if:  You bleed from your vagina.  You have cramps.  You have labor pains.  You have a fever that does not go away after you take medicine. Get help right away if:  You have very bad cramps in your back or belly (abdomen).  You have contractions.  You have chills.  You pass large clots or tissue from your vagina.  You bleed more.  You feel light-headed or weak.  You pass out (faint).  You are leaking fluid or have a gush of fluid from your vagina. This information is not intended to replace advice given to you by your health care provider. Make sure you discuss any questions you have with your health care provider. Document  Released: 03/09/2014 Document Revised: 03/30/2016 Document Reviewed: 06/30/2013 Elsevier Interactive Patient Education  2018 [employer].   Pelvic Rest Pelvic rest may be recommended if:  Your placenta is partially or completely covering the opening of your cervix (placenta previa).  There is bleeding between the wall of the uterus and the amniotic sac in the first trimester of pregnancy (subchorionic hemorrhage).  You went into labor too early (preterm labor).  Based on your overall health and the health of your baby, your health care provider will decide if pelvic rest is right for you. How do I rest my pelvis? For as long as told by your health care provider:  Do not have sex, sexual stimulation, or an orgasm.  Do not use tampons. Do not douche. Do not put anything in your vagina.  Do not lift anything that is heavier than 10 lb (4.5 kg).  Avoid activities that take a lot of effort (are strenuous).  Avoid any activity in which your pelvic muscles could become strained.  When should I seek medical care? Seek medical care if you have:  Cramping pain in your lower abdomen.  Vaginal discharge.  A low, dull backache.  Regular contractions.  Uterine tightening.  When should I seek immediate medical care? Seek immediate medical care if:  You have vaginal bleeding and you are  pregnant.  This information is not intended to replace advice given to you by your health care provider. Make sure you discuss any questions you have with your health care provider. Document Released: 02/17/2011 Document Revised: 03/30/2016 Document Reviewed: 04/26/2015 Elsevier Interactive Patient Education  2018 [employer].   Placenta Previa Placenta previa is a condition in which the placenta implants in the lower part of the uterus in pregnant women. The placenta either partially or completely covers the opening to the cervix. This is a problem because the baby must pass through the cervix  during delivery. There are three types of placenta previa:  Marginal placenta previa. The placenta reaches within an inch (2.5 cm) of the cervical opening but does not cover it.  Partial placenta previa. The placenta covers part of the cervical opening.  Complete placenta previa. The placenta covers the entire cervical opening.  If the previa is marginal or partial and it is diagnosed in the first half of pregnancy, the placenta may move into a normal position as the pregnancy progresses and may no longer cover the cervix. It is important to keep all prenatal visits with your health care provider so you can be more closely monitored. What are the causes? The cause of this condition is not known. What increases the risk? This condition is more likely to develop in women who:  Are carrying more than one baby (multiples).  Have an abnormally shaped uterus.  Have scars on the lining of the uterus.  Have had surgeries involving the uterus, such as a cesarean delivery.  Have delivered a baby before.  Have a history of placenta previa.  Have smoked or used cocaine during pregnancy.  Are age 75 or older during pregnancy.  What are the signs or symptoms? The main symptom of this condition is sudden, painless vaginal bleeding during the second half of pregnancy. The amount of bleeding can be very light at first, and it usually stops on its own. Heavier bleeding episodes may also happen. Some women with placenta previa may have no bleeding at all. How is this diagnosed?  This condition is diagnosed: ? From an ultrasound. This test uses sound waves to find where the placenta is located before you have any bleeding episodes. ? During a checkup after vaginal bleeding is noticed.  If you are diagnosed with a partial or complete previa, digital exams with fingers will generally be avoided. Your health care provider will still perform a speculum exam.  If you did not have an ultrasound during  your pregnancy, placenta previa may not be diagnosed until bleeding occurs during labor. How is this treated? Treatment for this condition may include:  Decreased activity.  Bed rest at home or in the hospital.  Pelvic rest. Nothing is placed inside the vagina during pelvic rest. This means not having sex and not using tampons or douches.  A blood transfusion to replace blood that you have lost (maternal blood loss).  A cesarean delivery. This may be performed if: ? The bleeding is heavy and cannot be controlled. ? The placenta completely covers the cervix.  Medicines to stop premature labor or to help the baby's lungs to mature. This treatment may be used if you need delivery before your pregnancy is full-term.  Your treatment will be decided based on:  How much you are bleeding, or whether the bleeding has stopped.  How far along you are in your pregnancy.  The condition of your baby.  The type of placenta previa  that you have.  Follow these instructions at home:  Get plenty of rest and lessen activity as told by your health care provider.  Stay on bed rest for as long as told by your health care provider.  Do not have sex, use tampons, use a douche, or place anything inside of your vagina if your health care provider recommended pelvic rest.  Take over-the-counter and prescription medicines as told by your health care provider.  Keep all follow-up visits as told by your health care provider. This is important. Get help right away if:  You have vaginal bleeding, even if in small amounts and even if you have no pain.  You have cramping or regular contractions.  You have pain in your abdomen or your lower back.  You have a feeling of increased pressure in your pelvis.  You have increased watery or bloody mucus from the vagina. This information is not intended to replace advice given to you by your health care provider. Make sure you discuss any questions you have  with your health care provider. Document Released: 10/23/2005 Document Revised: 07/12/2016 Document Reviewed: 05/06/2016 Elsevier Interactive Patient Education  [employer].

## 2018-08-16 NOTE — MAU Note (Signed)
Bleeding stopped while she was admitted then stopped.  Tonight noticed some rust colored blood in her underwear at 2345.  + FM.  No LOF.  Not feeling any ctx.

## 2018-08-19 ENCOUNTER — Encounter (HOSPITAL_COMMUNITY): Payer: Self-pay

## 2018-08-19 ENCOUNTER — Other Ambulatory Visit: Payer: Self-pay

## 2018-08-19 ENCOUNTER — Inpatient Hospital Stay (HOSPITAL_COMMUNITY)
Admission: AD | Admit: 2018-08-19 | Discharge: 2018-09-05 | DRG: 833 | Disposition: A | Discharge status: Home / Self Care | Payer: BLUE CROSS/BLUE SHIELD | Attending: Obstetrics and Gynecology | Admitting: Obstetrics and Gynecology

## 2018-08-19 DIAGNOSIS — O4403 Placenta previa specified as without hemorrhage, third trimester: Secondary | ICD-10-CM

## 2018-08-19 DIAGNOSIS — D649 Anemia, unspecified: Secondary | ICD-10-CM

## 2018-08-19 DIAGNOSIS — Z23 Encounter for immunization: Secondary | ICD-10-CM

## 2018-08-19 DIAGNOSIS — O99012 Anemia complicating pregnancy, second trimester: Secondary | ICD-10-CM

## 2018-08-19 DIAGNOSIS — O4410 Placenta previa with hemorrhage, unspecified trimester: Secondary | ICD-10-CM

## 2018-08-19 DIAGNOSIS — O4400 Placenta previa specified as without hemorrhage, unspecified trimester: Secondary | ICD-10-CM

## 2018-08-19 DIAGNOSIS — O4412 Placenta previa with hemorrhage, second trimester: Principal | ICD-10-CM

## 2018-08-19 DIAGNOSIS — Z3A26 26 weeks gestation of pregnancy: Secondary | ICD-10-CM

## 2018-08-19 DIAGNOSIS — O4402 Placenta previa specified as without hemorrhage, second trimester: Secondary | ICD-10-CM | POA: Diagnosis not present

## 2018-08-19 DIAGNOSIS — Z363 Encounter for antenatal screening for malformations: Secondary | ICD-10-CM | POA: Diagnosis not present

## 2018-08-19 DIAGNOSIS — M5489 Other dorsalgia: Secondary | ICD-10-CM | POA: Diagnosis not present

## 2018-08-19 DIAGNOSIS — Z3A27 27 weeks gestation of pregnancy: Secondary | ICD-10-CM | POA: Diagnosis not present

## 2018-08-19 DIAGNOSIS — Z3483 Encounter for supervision of other normal pregnancy, third trimester: Secondary | ICD-10-CM | POA: Diagnosis not present

## 2018-08-19 DIAGNOSIS — O4692 Antepartum hemorrhage, unspecified, second trimester: Secondary | ICD-10-CM | POA: Diagnosis not present

## 2018-08-19 DIAGNOSIS — Z3482 Encounter for supervision of other normal pregnancy, second trimester: Secondary | ICD-10-CM | POA: Diagnosis not present

## 2018-08-19 DIAGNOSIS — Z3A28 28 weeks gestation of pregnancy: Secondary | ICD-10-CM | POA: Diagnosis not present

## 2018-08-19 DIAGNOSIS — O2203 Varicose veins of lower extremity in pregnancy, third trimester: Secondary | ICD-10-CM | POA: Diagnosis not present

## 2018-08-19 LAB — CBC
HCT: 32.9 % — ABNORMAL LOW (ref 36.0–46.0)
Hemoglobin: 11.3 g/dL — ABNORMAL LOW (ref 12.0–15.0)
MCH: 33.6 pg (ref 26.0–34.0)
MCHC: 34.3 g/dL (ref 30.0–36.0)
MCV: 97.9 fL (ref 80.0–100.0)
Platelets: 294 10*3/uL (ref 150–400)
RBC: 3.36 MIL/uL — ABNORMAL LOW (ref 3.87–5.11)
RDW: 14.5 % (ref 11.5–15.5)
WBC: 13.9 10*3/uL — ABNORMAL HIGH (ref 4.0–10.5)
nRBC: 0 % (ref 0.0–0.2)

## 2018-08-19 LAB — TYPE AND SCREEN
ABO/RH(D): A POS
Antibody Screen: NEGATIVE

## 2018-08-19 MED ORDER — DOCUSATE SODIUM 100 MG PO CAPS
100.0000 mg | ORAL_CAPSULE | Freq: Every day | ORAL | Status: DC
  Administered 2018-08-20 – 2018-09-04 (×16): 100 mg via ORAL
  Filled 2018-08-19 (×16): qty 1

## 2018-08-19 MED ORDER — SODIUM CHLORIDE 0.9% FLUSH
3.0000 mL | Freq: Two times a day (BID) | INTRAVENOUS | Status: DC
  Administered 2018-08-19 – 2018-09-04 (×27): 3 mL via INTRAVENOUS

## 2018-08-19 MED ORDER — PRENATAL MULTIVITAMIN CH
1.0000 | ORAL_TABLET | Freq: Every day | ORAL | Status: DC
  Administered 2018-08-19 – 2018-09-04 (×17): 1 via ORAL
  Filled 2018-08-19 (×17): qty 1

## 2018-08-19 MED ORDER — ACETAMINOPHEN 325 MG PO TABS
650.0000 mg | ORAL_TABLET | ORAL | Status: DC | PRN
  Administered 2018-08-20 – 2018-08-29 (×5): 650 mg via ORAL
  Filled 2018-08-19 (×5): qty 2

## 2018-08-19 MED ORDER — ZOLPIDEM TARTRATE 5 MG PO TABS: 5.0000 mg | ORAL_TABLET | Freq: Every evening | ORAL | Status: DC | PRN

## 2018-08-19 MED ORDER — SODIUM CHLORIDE 0.9% FLUSH
3.0000 mL | INTRAVENOUS | Status: DC | PRN
  Administered 2018-09-01: 3 mL via INTRAVENOUS
  Filled 2018-08-19: qty 3

## 2018-08-19 MED ORDER — SODIUM CHLORIDE 0.9 % IV SOLN: 250.0000 mL | INTRAVENOUS | Status: DC | PRN

## 2018-08-19 MED ORDER — INFLUENZA VAC SPLIT QUAD 0.5 ML IM SUSY
0.5000 mL | PREFILLED_SYRINGE | INTRAMUSCULAR | Status: AC
  Administered 2018-08-20: 0.5 mL via INTRAMUSCULAR
  Filled 2018-08-19: qty 0.5

## 2018-08-19 MED ORDER — CALCIUM CARBONATE ANTACID 500 MG PO CHEW
2.0000 | CHEWABLE_TABLET | ORAL | Status: DC | PRN
  Administered 2018-08-30: 400 mg via ORAL
  Filled 2018-08-19: qty 2

## 2018-08-19 NOTE — H&P (Addendum)
History   HISTORY AND PHYSICAL EXAM  CSN: [id number]  Arrival date and time: 08/19/18 1734  First Provider Initiated Contact with Patient 08/19/18 1758      Chief Complaint  Patient presents with  . Vaginal Bleeding  . complete previa   Jacqueline [name] is a 27 y.o. G2P0 at [redacted]w[redacted]d who presents to MAU with complaints of vaginal bleeding. She has a known complete previa during this pregnancy and was recent discharged on 10/8. Received BMZ on 10/5, 10/6. Presented last week again with brownish dc. Has known complete previa documented on last admission with nl CL. Denies contractions. She reports vaginal bleeding started this evening when she was sitting down, put a panty liner on and soaked through liner. Went to the restroom and continued to trickle then placed sanitary napkin on and came to MAU. She describes vaginal bleeding as bright red bleeding with no clots. She reports +FM. Denies abdominal pain or LOF.  Vaginal Bleeding  The patient's pertinent negatives include no pelvic pain. Pertinent negatives include no dysuria, frequency or urgency.    OB History    Gravida  2   Para  0   Term  0   Preterm  0   AB  1   Living  0     SAB  1   TAB  0   Ectopic  0   Multiple  0   Live Births              Past Medical History:  Diagnosis Date  . Allergic rhinitis   . Endometriosis   . Thyroid nodule     Past Surgical History:  Procedure Laterality Date  . CHROMOPERTUBATION Bilateral 03/20/2017   Procedure: CHROMOPERTUBATION;  Surgeon: Megan Salon, MD;  Location: [facility];  Service: Gynecology;  Laterality: Bilateral;  Fallopian tubes  . CYSTOSCOPY N/A 03/20/2017   Procedure: CYSTOSCOPY;  Surgeon: Megan Salon, MD;  Location: [facility];  Service: Gynecology;  Laterality: N/A;  . FOOT SURGERY Left    neuromona  . LAPAROSCOPIC OVARIAN CYSTECTOMY Bilateral 03/20/2017   Procedure: LAPAROSCOPIC OVARIAN CYSTECTOMY;  Surgeon: Megan Salon, MD;  Location: [facility];  Service:  Gynecology;  Laterality: Bilateral;  . LAPAROSCOPIC UNILATERAL SALPINGECTOMY Right 03/20/2017   Procedure: LAPAROSCOPIC UNILATERAL SALPINGECTOMY;  Surgeon: Megan Salon, MD;  Location: [facility];  Service: Gynecology;  Laterality: Right;  . LAPAROSCOPY Bilateral 03/20/2017   Procedure: LAPAROSCOPY OPERATIVE  WITH LYSIS OF ADHESIONS;  Surgeon: Megan Salon, MD;  Location: [facility];  Service: Gynecology;  Laterality: Bilateral;  . WISDOM TOOTH EXTRACTION      Family History  Problem Relation Age of Onset  . Breast cancer Mother 68        mastectomy  . Thyroid disease Neg Hx     Social History   Tobacco Use  . Smoking status: Never Smoker  . Smokeless tobacco: Never Used  Substance Use Topics  . Alcohol use: Yes    Alcohol/week: 1.0 - 3.0 standard drinks    Types: 1 - 3 Standard drinks or equivalent per week    Comment: occasional   . Drug use: No    Allergies:  Allergies  Allergen Reactions  . Shellfish Allergy Anaphylaxis  . Amoxicillin-Pot Clavulanate Other (See Comments)    Does not remember  . Ciprofloxacin     Caused QT prolongation    Medications Prior to Admission  Medication Sig Dispense Refill Last Dose  . loratadine (CLARITIN) 10 MG tablet Take  10 mg by mouth daily.   Past Week at Unknown time  . Prenatal Vit-Fe Fumarate-FA (PRENATAL MULTIVITAMIN) TABS tablet Take 1 tablet by mouth daily at 12 noon.   08/09/2018 at Unknown time    Review of Systems  Constitutional: Negative.   Respiratory: Negative.   Cardiovascular: Negative.   Gastrointestinal: Negative.   Genitourinary: Positive for vaginal bleeding. Negative for difficulty urinating, dysuria, frequency, pelvic pain and urgency.  Musculoskeletal: Negative.   Neurological: Negative.    Physical Exam   Blood pressure 102/70, pulse 84, temperature 97.8 F (36.6 C), temperature source Oral, resp. rate 17, height 5\' 2"  (1.575 m), weight 64.1 kg, last menstrual period 12/17/2017, SpO2 98 %.  Physical Exam   Nursing note and vitals reviewed. Constitutional: She is oriented to person, place, and time. She appears well-developed and well-nourished. No distress.  HENT:  Head: Normocephalic and atraumatic.  Neck: Normal range of motion. Neck supple.  Cardiovascular: Normal rate, regular rhythm and normal heart sounds.  Respiratory: Effort normal. No respiratory distress. She has no wheezes.  GI: Soft. Bowel sounds are normal. There is no tenderness. There is no rebound.  Gravid appropriate for gestational age  Genitourinary: Vagina normal and uterus normal.  Genitourinary Comments: Pelvic exam: cervix visually closed, no active bleeding noted from cervical os, moderate amount of dark red bleeding pooling under cervix (4 faux swabs used to visualize cervix)   Musculoskeletal: Normal range of motion. She exhibits no edema.  Neurological: She is alert and oriented to person, place, and time.  Skin: Skin is warm and dry.  Psychiatric: She has a normal mood and affect. Her behavior is normal. Thought content normal.   FHR: 130/ moderate variability/ +accels/ 1 variable deceleration Toco: 1 mild uterine contraction noted  MAU Course  Procedures  MDM Sterile speculum examination  CBC, Type and screen  ABO: A Pos, no rhogam needed IV saline lock   CBC    Component Value Date/Time   WBC 13.9 (H) 08/19/2018 1823   RBC 3.36 (L) 08/19/2018 1823   HGB 11.3 (L) 08/19/2018 1823   HCT 32.9 (L) 08/19/2018 1823   PLT 294 08/19/2018 1823   MCV 97.9 08/19/2018 1823   MCH 33.6 08/19/2018 1823   MCHC 34.3 08/19/2018 1823   RDW 14.5 08/19/2018 1823   LYMPHSABS 1.6 03/29/2017 0617   MONOABS 0.7 03/29/2017 0617   EOSABS 0.7 03/29/2017 0617   BASOSABS 0.0 03/29/2017 0617        Assessment and Plan  26 week IUP 1. Complete previa  2. Vaginal bleeding - second episode  Admit to antenatal  CBC and TS NST TID  Tripp Goins J CNM 08/19/2018, 9:43 PM

## 2018-08-19 NOTE — MAU Note (Signed)
Pt presents to MAU with complaints of vaginal bleeding today. Complete previa. 3rd bleed.. Lower abdominal cramping

## 2018-08-19 NOTE — MAU Provider Note (Signed)
History     CSN: [id number]  Arrival date and time: 08/19/18 1734  First Provider Initiated Contact with Patient 08/19/18 1758      Chief Complaint  Patient presents with  . Vaginal Bleeding  . complete previa   Johan Antonacci is a 27 y.o. G2P0 at [redacted]w[redacted]d who presents to MAU with complaints of vaginal bleeding. She has a known complete previa during this pregnancy and was recent discharged on 10/8. Received BMZ during that admission. She reports vaginal bleeding started this evening when she was sitting down, put a panty liner on and soaked through liner. Went to the restroom and continued to trickle then placed sanitary napkin on and came to MAU. She describes vaginal bleeding as bright red bleeding with no clots. She reports +FM. Denies abdominal pain or contractions.    OB History    Gravida  2   Para  0   Term  0   Preterm  0   AB  1   Living  0     SAB  1   TAB  0   Ectopic  0   Multiple  0   Live Births              Past Medical History:  Diagnosis Date  . Allergic rhinitis   . Endometriosis   . Thyroid nodule     Past Surgical History:  Procedure Laterality Date  . CHROMOPERTUBATION Bilateral 03/20/2017   Procedure: CHROMOPERTUBATION;  Surgeon: Megan Salon, MD;  Location: [facility];  Service: Gynecology;  Laterality: Bilateral;  Fallopian tubes  . CYSTOSCOPY N/A 03/20/2017   Procedure: CYSTOSCOPY;  Surgeon: Megan Salon, MD;  Location: [facility];  Service: Gynecology;  Laterality: N/A;  . FOOT SURGERY Left    neuromona  . LAPAROSCOPIC OVARIAN CYSTECTOMY Bilateral 03/20/2017   Procedure: LAPAROSCOPIC OVARIAN CYSTECTOMY;  Surgeon: Megan Salon, MD;  Location: [facility];  Service: Gynecology;  Laterality: Bilateral;  . LAPAROSCOPIC UNILATERAL SALPINGECTOMY Right 03/20/2017   Procedure: LAPAROSCOPIC UNILATERAL SALPINGECTOMY;  Surgeon: Megan Salon, MD;  Location: [facility];  Service: Gynecology;  Laterality: Right;  . LAPAROSCOPY Bilateral 03/20/2017   Procedure: LAPAROSCOPY OPERATIVE  WITH LYSIS OF ADHESIONS;  Surgeon: Megan Salon, MD;  Location: [facility];  Service: Gynecology;  Laterality: Bilateral;  . WISDOM TOOTH EXTRACTION      Family History  Problem Relation Age of Onset  . Breast cancer Mother 22        mastectomy  . Thyroid disease Neg Hx     Social History   Tobacco Use  . Smoking status: Never Smoker  . Smokeless tobacco: Never Used  Substance Use Topics  . Alcohol use: Yes    Alcohol/week: 1.0 - 3.0 standard drinks    Types: 1 - 3 Standard drinks or equivalent per week    Comment: occasional   . Drug use: No    Allergies:  Allergies  Allergen Reactions  . Shellfish Allergy Anaphylaxis  . Amoxicillin-Pot Clavulanate Other (See Comments)    Does not remember  . Ciprofloxacin     Caused QT prolongation    Medications Prior to Admission  Medication Sig Dispense Refill Last Dose  . loratadine (CLARITIN) 10 MG tablet Take 10 mg by mouth daily.   Past Week at Unknown time  . Prenatal Vit-Fe Fumarate-FA (PRENATAL MULTIVITAMIN) TABS tablet Take 1 tablet by mouth daily at 12 noon.   08/09/2018 at Unknown time    Review of Systems  Constitutional:  Negative.   Respiratory: Negative.   Cardiovascular: Negative.   Gastrointestinal: Negative.   Genitourinary: Positive for vaginal bleeding. Negative for difficulty urinating, dysuria, frequency, pelvic pain and urgency.  Musculoskeletal: Negative.   Neurological: Negative.    Physical Exam   Blood pressure 126/77, pulse (!) 105, temperature 98.1 F (36.7 C), resp. rate 16, weight 64.1 kg, last menstrual period 12/17/2017.  Physical Exam  Nursing note and vitals reviewed. Constitutional: She is oriented to person, place, and time. She appears well-developed and well-nourished. No distress.  Cardiovascular: Normal rate, regular rhythm and normal heart sounds.  Respiratory: Effort normal. No respiratory distress. She has no wheezes.  GI: Soft. There is no  tenderness. There is no rebound.  Gravid appropriate for gestational age  Genitourinary: There is bleeding in the vagina.  Genitourinary Comments: Pelvic exam: cervix visually closed, no active bleeding noted from cervical os, moderate amount of dark red bleeding pooling under cervix (4 faux swabs used to visualize cervix)   Musculoskeletal: Normal range of motion. She exhibits no edema.  Neurological: She is alert and oriented to person, place, and time.  Psychiatric: She has a normal mood and affect. Her behavior is normal. Thought content normal.   FHR: 130/ moderate variability/ +accels/ 1 variable deceleration Toco: 1 mild uterine contraction noted  MAU Course  Procedures  MDM Sterile speculum examination  CBC, Type and screen  ABO: A Pos, no rhogam needed IV saline lock   Consult with Dr Ronita Hipps with assessment and admission. Agrees with plan of care to admit patient to antenatal. Orders placed and care assumed by Dr Ronita Hipps.   Assessment and Plan   1. Complete previa  2. Vaginal bleeding   Admit to antenatal  Orders placed and care assumed by Dr Ronita Hipps   Lajean Manes CNM 08/19/2018, 6:18 PM

## 2018-08-20 ENCOUNTER — Inpatient Hospital Stay (HOSPITAL_BASED_OUTPATIENT_CLINIC_OR_DEPARTMENT_OTHER): Payer: BLUE CROSS/BLUE SHIELD

## 2018-08-20 DIAGNOSIS — O4410 Placenta previa with hemorrhage, unspecified trimester: Secondary | ICD-10-CM

## 2018-08-20 DIAGNOSIS — Z3A26 26 weeks gestation of pregnancy: Secondary | ICD-10-CM

## 2018-08-20 DIAGNOSIS — O4402 Placenta previa specified as without hemorrhage, second trimester: Secondary | ICD-10-CM

## 2018-08-20 DIAGNOSIS — Z363 Encounter for antenatal screening for malformations: Secondary | ICD-10-CM

## 2018-08-20 MED ORDER — SODIUM CHLORIDE 0.9 % IV SOLN
510.0000 mg | Freq: Once | INTRAVENOUS | Status: AC
  Administered 2018-08-20: 510 mg via INTRAVENOUS
  Filled 2018-08-20: qty 17

## 2018-08-20 NOTE — Progress Notes (Signed)
HD # 2 26wks 1d Previa- 2nd bleed  Pt notes mild cramping last night, no cramping this am. Bleeding now minimal to scant. Good FM. No LOF. NO pain or contractions noted  PE:  Vitals:   08/19/18 2333 08/20/18 0630  BP: 106/61 101/76  Pulse: 98 76  Resp: 16 16  Temp: 98 F (36.7 C) (!) 97.4 F (36.3 C)  SpO2: 96%    CBC    Component Value Date/Time   WBC 13.9 (H) 08/19/2018 1823   RBC 3.36 (L) 08/19/2018 1823   HGB 11.3 (L) 08/19/2018 1823   HCT 32.9 (L) 08/19/2018 1823   PLT 294 08/19/2018 1823   MCV 97.9 08/19/2018 1823   MCH 33.6 08/19/2018 1823   MCHC 34.3 08/19/2018 1823   RDW 14.5 08/19/2018 1823   LYMPHSABS 1.6 03/29/2017 0617   MONOABS 0.7 03/29/2017 0617   EOSABS 0.7 03/29/2017 0617   BASOSABS 0.0 03/29/2017 0617    Gen: well appearing, no distress Neck : supple with FORM Lgs: CTA CV: RRR Abd: gravid, NT LE: no edema GU: exam deferred, minimal blood on pad Neuro: nonfocal Skin: intact  Toco: irritability. FH: 135s-150s, + accels, no decels, 10 beat variability Reassuring FHR x 3 . NST x 3.  A/P:  26w 1d IUP Complete Previa- second bleed. Stable. Gestational anemia improved, gvie second dose IV FE today. IVF pregnancy BMZ  Complete Will do sono to ck growth Continue inpatient status  Jacqueline [name] J 08/20/2018 6:32 AM

## 2018-08-21 NOTE — Progress Notes (Addendum)
HD # 3 26wks 2d Previa- 2nd bleed  Pt notes mild cramping last night, no cramping this am. Bleeding now minimal and mostly brownish Good FM. No LOF. NO pain or contractions noted  PE:  Vitals:   08/21/18 0004 08/21/18 0619  BP: 106/75 107/67  Pulse: 86 88  Resp: 16 16  Temp: (!) 97.4 F (36.3 C) 97.9 F (36.6 C)  SpO2: 99% 98%   CBC    Component Value Date/Time   WBC 13.9 (H) 08/19/2018 1823   RBC 3.36 (L) 08/19/2018 1823   HGB 11.3 (L) 08/19/2018 1823   HCT 32.9 (L) 08/19/2018 1823   PLT 294 08/19/2018 1823   MCV 97.9 08/19/2018 1823   MCH 33.6 08/19/2018 1823   MCHC 34.3 08/19/2018 1823   RDW 14.5 08/19/2018 1823   LYMPHSABS 1.6 03/29/2017 0617   MONOABS 0.7 03/29/2017 0617   EOSABS 0.7 03/29/2017 0617   BASOSABS 0.0 03/29/2017 0617    Gen: well appearing, no distress Neck : supple with FROM Lgs: CTA CV: RRR Abd: gravid, NT No CVAT LE: no edema, neg cords GU: exam deferred, scant blood on pad Neuro: nonfocal Skin: intact  Toco: irritability. FH: 135s-150s, + accels, no decels, 10 beat variability Reassuring FHR x 3 . NST x 3.  Sono 10/15 AGA, 915gm, nl AFI CL 3.5cm. Complete previa.  A/P:  26w 2d IUP AGA by sono 10/15 Complete Previa- second bleed. Stable. Gestational anemia improved, gvie second dose IV FE today. IVF pregnancy BMZ  Complete Continue inpatient status  Jacqueline [name] J 08/21/2018 6:44 AM

## 2018-08-22 LAB — TYPE AND SCREEN
ABO/RH(D): A POS
Antibody Screen: NEGATIVE

## 2018-08-22 NOTE — Progress Notes (Signed)
HD # 4 26wks 3d Previa- 2nd bleed  Pt notes mild cramping last night, no cramping this am. Bleeding now minimal and mostly brownish still today. Good FM. No LOF. NO pain or contractions noted  PE:  Vitals:   08/21/18 2308 08/22/18 0506  BP: 102/75 (!) 91/56  Pulse: 79 84  Resp: 20 20  Temp: 97.8 F (36.6 C) 98 F (36.7 C)  SpO2: 100% 98%   CBC    Component Value Date/Time   WBC 13.9 (H) 08/19/2018 1823   RBC 3.36 (L) 08/19/2018 1823   HGB 11.3 (L) 08/19/2018 1823   HCT 32.9 (L) 08/19/2018 1823   PLT 294 08/19/2018 1823   MCV 97.9 08/19/2018 1823   MCH 33.6 08/19/2018 1823   MCHC 34.3 08/19/2018 1823   RDW 14.5 08/19/2018 1823   LYMPHSABS 1.6 03/29/2017 0617   MONOABS 0.7 03/29/2017 0617   EOSABS 0.7 03/29/2017 0617   BASOSABS 0.0 03/29/2017 0617    Gen: well appearing, no distress Neck : supple with FROM Lgs: CTA CV: RRR Abd: gravid, NT No CVAT LE: no edema, neg cords GU: exam deferred, scant blood on pad Neuro: nonfocal Skin: intact  Toco: irritability. FH: 135s-150s, + accels, no decels, 10 beat variability Reassuring FHR x 3 . NST x 3.  Sono 10/15 AGA, 915gm, nl AFI CL 3.5cm. Complete previa.  A/P:  26w 3d IUP AGA by sono 10/15 Complete Previa- second bleed. Stable. Gestational anemia improved s/p Feraheme IVF pregnancy BMZ  Complete Continue inpatient status  Lovenia Kim 08/22/2018 6:56 AM

## 2018-08-23 ENCOUNTER — Encounter (HOSPITAL_COMMUNITY): Payer: Self-pay

## 2018-08-23 NOTE — Progress Notes (Signed)
HD # 5 26wks 4d Previa- 2nd bleed  Pt notes mild cramping last night, no cramping this am. Bleeding now minimal and mostly brownish still today. Good FM. No LOF. NO pain or contractions noted  PE:  Vitals:   08/22/18 2330 08/23/18 0530  BP: (!) 99/56 (!) 94/58  Pulse: 82 96  Resp: 20 20  Temp: 98 F (36.7 C) 98.5 F (36.9 C)  SpO2: 99% 98%   CBC    Component Value Date/Time   WBC 13.9 (H) 08/19/2018 1823   RBC 3.36 (L) 08/19/2018 1823   HGB 11.3 (L) 08/19/2018 1823   HCT 32.9 (L) 08/19/2018 1823   PLT 294 08/19/2018 1823   MCV 97.9 08/19/2018 1823   MCH 33.6 08/19/2018 1823   MCHC 34.3 08/19/2018 1823   RDW 14.5 08/19/2018 1823   LYMPHSABS 1.6 03/29/2017 0617   MONOABS 0.7 03/29/2017 0617   EOSABS 0.7 03/29/2017 0617   BASOSABS 0.0 03/29/2017 0617    Gen: well appearing, no distress Neck : supple with FROM Lgs: CTA CV: RRR Abd: gravid, NT No CVAT LE: no edema, neg cords GU: exam deferred, scant blood on pad Neuro: nonfocal Skin: intact  Toco: irritability. FH: 135s-150s, + accels, no decels, 10 beat variability Reassuring FHR x 3 . NST x 3.  Sono 10/15 AGA, 915gm, nl AFI CL 3.5cm. Complete previa.  A/P:  26w 4d IUP AGA by sono 10/15 Complete Previa- second bleed. Stable. Gestational anemia improved s/p Feraheme IVF pregnancy BMZ  Complete Continue inpatient status  Kamayah Pillay J 08/23/2018 6:40 AM

## 2018-08-24 NOTE — Progress Notes (Signed)
HD # 6 26wks 5d Previa- 2nd bleed  Pt notes mild cramping last night, no cramping this am. Bleeding now minimal and mostly brownish still today. Good FM. No LOF. NO pain or contractions noted  PE:  Vitals:   08/24/18 0620 08/24/18 0833  BP: 100/65 103/71  Pulse: 82 92  Resp: 16 16  Temp: 98.5 F (36.9 C) 98.3 F (36.8 C)  SpO2: 98% 100%   CBC    Component Value Date/Time   WBC 13.9 (H) 08/19/2018 1823   RBC 3.36 (L) 08/19/2018 1823   HGB 11.3 (L) 08/19/2018 1823   HCT 32.9 (L) 08/19/2018 1823   PLT 294 08/19/2018 1823   MCV 97.9 08/19/2018 1823   MCH 33.6 08/19/2018 1823   MCHC 34.3 08/19/2018 1823   RDW 14.5 08/19/2018 1823   LYMPHSABS 1.6 03/29/2017 0617   MONOABS 0.7 03/29/2017 0617   EOSABS 0.7 03/29/2017 0617   BASOSABS 0.0 03/29/2017 0617    Gen: well appearing, no distress Neck : supple with FROM Lgs: CTA CV: RRR Abd: gravid, NT No CVAT LE: no edema, neg cords GU: exam deferred, scant blood on pad Neuro: nonfocal Skin: intact  Toco: irritability. FH: 135s-150s, + accels, no decels, 10 beat variability Reassuring FHR x 3 . NST x 3.  Sono 10/15 AGA, 915gm, nl AFI CL 3.5cm. Complete previa.  A/P:  26w 5d IUP AGA by sono 10/15 Complete Previa- second bleed. Stable. Gestational anemia improved s/p Feraheme IVF pregnancy BMZ  Complete Continue inpatient status  Jacqueline [name] J 08/24/2018 10:26 AM

## 2018-08-24 NOTE — Plan of Care (Signed)
  Problem: Health Behavior/Discharge Planning: Goal: Ability to manage health-related needs will improve Outcome: Progressing   Problem: Clinical Measurements: Goal: Ability to maintain clinical measurements within normal limits will improve Outcome: Progressing Goal: Will remain free from infection Outcome: Progressing   Problem: Elimination: Goal: Will not experience complications related to bowel motility Outcome: Progressing Goal: Will not experience complications related to urinary retention Outcome: Progressing   Problem: Pain Managment: Goal: General experience of comfort will improve Outcome: Progressing   Problem: Safety: Goal: Ability to remain free from injury will improve Outcome: Progressing

## 2018-08-25 NOTE — Progress Notes (Signed)
HD # 7 26wks 6d Previa- 2nd bleed  Pt notes mild cramping last night, no cramping this am. No bleeding or brownish dc in last 24 hrs. Good FM. No LOF. NO pain or contractions noted  PE:  Vitals:   08/25/18 0815 08/25/18 1229  BP: 99/67 113/73  Pulse: 88 87  Resp: 16 18  Temp: 98.2 F (36.8 C) 99 F (37.2 C)  SpO2: 99% 99%   CBC    Component Value Date/Time   WBC 13.9 (H) 08/19/2018 1823   RBC 3.36 (L) 08/19/2018 1823   HGB 11.3 (L) 08/19/2018 1823   HCT 32.9 (L) 08/19/2018 1823   PLT 294 08/19/2018 1823   MCV 97.9 08/19/2018 1823   MCH 33.6 08/19/2018 1823   MCHC 34.3 08/19/2018 1823   RDW 14.5 08/19/2018 1823   LYMPHSABS 1.6 03/29/2017 0617   MONOABS 0.7 03/29/2017 0617   EOSABS 0.7 03/29/2017 0617   BASOSABS 0.0 03/29/2017 0617    Gen: well appearing, no distress Neck : supple with FROM Lgs: CTA CV: RRR Abd: gravid, NT No CVAT LE: no edema, neg cords GU: exam deferred, no blood on pad Neuro: nonfocal Skin: intact  Toco: irritability. FH: 135s-150s, + accels, no decels, 10 beat variability Reassuring FHR x 3 . NST x 3.  Sono 10/15 AGA, 915gm, nl AFI CL 3.5cm. Complete previa.  A/P:  26w 5d IUP AGA by sono 10/15 Complete Previa- second bleed. Stable. Gestational anemia improved s/p Feraheme IVF pregnancy BMZ  Complete Continue inpatient status  Lovenia Kim 08/25/2018 12:34 PM

## 2018-08-26 LAB — TYPE AND SCREEN
ABO/RH(D): A POS
Antibody Screen: NEGATIVE

## 2018-08-26 NOTE — Progress Notes (Signed)
Patient complained of pain  Like a charlie horse in her anterior left thigh.No redness or swelling of leg noted.Pedal,popliteal and groin pulses all present and strong.

## 2018-08-26 NOTE — Progress Notes (Signed)
HD # 8 27wks 0d Previa- 2nd bleed  Pt notes mild cramping last night, no cramping this am. No bleeding or brownish dc in last 24 hrs. Good FM. No LOF. NO pain or contractions noted Notes superficial pain anterior left thigh. No swelling.  PE:  Vitals:   08/25/18 2308 08/26/18 0538  BP: 99/73 96/68  Pulse:  72  Resp: 15 16  Temp: 98.7 F (37.1 C) 98.5 F (36.9 C)  SpO2: 99% 99%   CBC    Component Value Date/Time   WBC 13.9 (H) 08/19/2018 1823   RBC 3.36 (L) 08/19/2018 1823   HGB 11.3 (L) 08/19/2018 1823   HCT 32.9 (L) 08/19/2018 1823   PLT 294 08/19/2018 1823   MCV 97.9 08/19/2018 1823   MCH 33.6 08/19/2018 1823   MCHC 34.3 08/19/2018 1823   RDW 14.5 08/19/2018 1823   LYMPHSABS 1.6 03/29/2017 0617   MONOABS 0.7 03/29/2017 0617   EOSABS 0.7 03/29/2017 0617   BASOSABS 0.0 03/29/2017 0617    Gen: well appearing, no distress Neck : supple with FROM Lgs: CTA CV: RRR Abd: gravid, NT No CVAT LE: no edema, neg cords GU: exam deferred, no blood on pad Neuro: nonfocal Skin: intact  Toco: irritability. FH: 135s-150s, + accels, no decels, 5-25 BTBV Reassuring FHR x 3 . NST x 3.  Sono 10/15 AGA, 915gm, nl AFI CL 3.5cm. Complete previa.  A/P:  27w 0d IUP AGA by sono 10/15 Complete Previa- second bleed. Stable. Gestational anemia improved s/p Feraheme IVF pregnancy BMZ  Complete Continue inpatient status  Lovenia Kim 08/26/2018 6:35 AM

## 2018-08-27 NOTE — Progress Notes (Signed)
HD # 9 27wks 1d Previa- 2nd bleed  Pt notes mild cramping last night, no cramping this am. Recurrent  brownish dc in last 24 hrs. Good FM. No LOF. NO pain or contractions noted Notes superficial pain anterior left thigh improved. No swelling.  PE:  Vitals:   08/26/18 2308 08/27/18 0552  BP: 102/60 (!) 94/54  Pulse: 83 92  Resp: 20 20  Temp: 98 F (36.7 C) 98 F (36.7 C)  SpO2: 98% 100%   CBC    Component Value Date/Time   WBC 13.9 (H) 08/19/2018 1823   RBC 3.36 (L) 08/19/2018 1823   HGB 11.3 (L) 08/19/2018 1823   HCT 32.9 (L) 08/19/2018 1823   PLT 294 08/19/2018 1823   MCV 97.9 08/19/2018 1823   MCH 33.6 08/19/2018 1823   MCHC 34.3 08/19/2018 1823   RDW 14.5 08/19/2018 1823   LYMPHSABS 1.6 03/29/2017 0617   MONOABS 0.7 03/29/2017 0617   EOSABS 0.7 03/29/2017 0617   BASOSABS 0.0 03/29/2017 0617    Gen: well appearing, no distress Neck : supple with FROM Lgs: CTA CV: RRR Abd: gravid, NT No CVAT LE: no edema, neg cords GU: exam deferred, no blood on pad Neuro: nonfocal Skin: intact  Toco: irritability. FH: 135s-150s, + accels, no decels, 5-25 BTBV Reassuring FHR x 3 . NST x 3.  Sono 10/15 AGA, 915gm, nl AFI CL 3.5cm. Complete previa.  A/P:  27w 1d IUP AGA by sono 10/15 Complete Previa- second bleed. Stable. Gestational anemia improved s/p Feraheme IVF pregnancy BMZ  Complete Continue inpatient status  Jacqueline [name] J 08/27/2018 6:12 AM

## 2018-08-28 NOTE — Progress Notes (Signed)
Initial Nutrition Assessment  DOCUMENTATION CODES:   Not applicable  INTERVENTION:  Regular diet May order double protein portions, snacks TID  NUTRITION DIAGNOSIS:  Increased nutrient needs related to (pregnancy and fetal growth requirements) as evidenced by (27 weeks IUP).  GOAL:  Patient will meet greater than or equal to 90% of their needs  MONITOR:  Weight trends  REASON FOR ASSESSMENT:  Antenatal   ASSESSMENT:  27 2/7 weeks, previa/bleeding. wt at initial PNV 124 lbs, BMI 22.7.  16 lb weight gain to date  Pt reports good appetite, no n/v. Family bringing her snack foods  Diet Order:   Diet Order            Diet regular Room service appropriate? Yes; Fluid consistency: Thin  Diet effective now             EDUCATION NEEDS:   No education needs have been identified at this time  Skin:    wnl  Last BM:   10/20  Height:   Ht Readings from Last 1 Encounters:  08/19/18 5\' 2"  (1.575 m)    Weight:   Wt Readings from Last 1 Encounters:  08/21/18 63.5 kg    Ideal Body Weight:   110 lbs  BMI:  Body mass index is 25.62 kg/m.  Estimated Nutritional Needs:   Kcal:  1700-1900  Protein:  75-85 g  Fluid:  2 L    Jacqueline [name] LDN Neonatal Nutrition Support Specialist/RD III Pager [phone]      Phone [phone]

## 2018-08-28 NOTE — Progress Notes (Signed)
HD # 10 27wks 2d Previa- 2nd bleed  Pt notes mild cramping last night, no cramping this am. Continued dark  brownish dc in last 24 hrs. Good FM. No LOF. NO pain or contractions noted Notes superficial pain anterior left thigh improved but still present at rest. No swelling.  PE:  Vitals:   08/28/18 0001 08/28/18 0346  BP: 105/65 100/68  Pulse: 88 94  Resp: 16 16  Temp: 98.4 F (36.9 C) 98.2 F (36.8 C)  SpO2: 99% 99%   CBC    Component Value Date/Time   WBC 13.9 (H) 08/19/2018 1823   RBC 3.36 (L) 08/19/2018 1823   HGB 11.3 (L) 08/19/2018 1823   HCT 32.9 (L) 08/19/2018 1823   PLT 294 08/19/2018 1823   MCV 97.9 08/19/2018 1823   MCH 33.6 08/19/2018 1823   MCHC 34.3 08/19/2018 1823   RDW 14.5 08/19/2018 1823   LYMPHSABS 1.6 03/29/2017 0617   MONOABS 0.7 03/29/2017 0617   EOSABS 0.7 03/29/2017 0617   BASOSABS 0.0 03/29/2017 0617    Gen: well appearing, no distress Neck : supple with FROM Lgs: CTA CV: RRR Abd: gravid, NT No CVAT LE: no edema, neg cords. Left thigh appears well perfused, non tender and no erythema GU: exam deferred, dark brown blood on pad Neuro: nonfocal Skin: intact  Toco: irritability. FH: 135s-150s, + accels, no decels, 5-25 BTBV Reassuring FHR x 3 . NST x 3.  Sono 10/15 AGA, 915gm, nl AFI CL 3.5cm. Complete previa.  A/P:  27w 2d IUP AGA by sono 10/15 Complete Previa- second bleed. Stable. Gestational anemia improved s/p Feraheme IVF pregnancy BMZ  Complete Continue inpatient status  Lovenia Kim 08/28/2018 6:36 AM

## 2018-08-29 LAB — TYPE AND SCREEN
ABO/RH(D): A POS
Antibody Screen: NEGATIVE

## 2018-08-29 MED ORDER — HYDROCORTISONE 1 % EX CREA
TOPICAL_CREAM | Freq: Four times a day (QID) | CUTANEOUS | Status: DC | PRN
  Administered 2018-08-29: 11:00:00 via TOPICAL
  Filled 2018-08-29: qty 28

## 2018-08-29 NOTE — Progress Notes (Signed)
HD # 11 27wks 3d Previa- 2nd bleed  Pt notes mild cramping last night, no cramping this am. Continued dark  brownish dc in last 24 hrs. Good FM. No LOF. NO pain or contractions noted Notes superficial pain anterior left thigh improved but still present at rest. No swelling.  PE:  Vitals:   08/28/18 2027 08/29/18 0623  BP: 104/69 94/63  Pulse: 73 81  Resp: 18 16  Temp: 98.3 F (36.8 C) 98.1 F (36.7 C)  SpO2: 95% 97%   CBC    Component Value Date/Time   WBC 13.9 (H) 08/19/2018 1823   RBC 3.36 (L) 08/19/2018 1823   HGB 11.3 (L) 08/19/2018 1823   HCT 32.9 (L) 08/19/2018 1823   PLT 294 08/19/2018 1823   MCV 97.9 08/19/2018 1823   MCH 33.6 08/19/2018 1823   MCHC 34.3 08/19/2018 1823   RDW 14.5 08/19/2018 1823   LYMPHSABS 1.6 03/29/2017 0617   MONOABS 0.7 03/29/2017 0617   EOSABS 0.7 03/29/2017 0617   BASOSABS 0.0 03/29/2017 0617    Gen: well appearing, no distress Neck : supple with FROM Lgs: CTA CV: RRR Abd: gravid, NT No CVAT LE: no edema, neg cords. Left thigh appears well perfused, non tender and no erythema GU: exam deferred, dark brown blood on pad Neuro: nonfocal Skin: intact  Toco: irritability. FH: 135s-150s, + accels, no decels, 5-25 BTBV Reassuring FHR x 3 . NST x 3.  Sono 10/15 AGA, 915gm, nl AFI CL 3.5cm. Complete previa.  A/P:  27w 3d IUP AGA by sono 10/15 Complete Previa- second bleed. Stable. Gestational anemia improved s/p Feraheme IVF pregnancy BMZ  Complete Continue inpatient status  Jacqueline [name] 08/29/2018 6:58 AM

## 2018-08-30 DIAGNOSIS — Z3483 Encounter for supervision of other normal pregnancy, third trimester: Secondary | ICD-10-CM | POA: Diagnosis not present

## 2018-08-30 DIAGNOSIS — O2203 Varicose veins of lower extremity in pregnancy, third trimester: Secondary | ICD-10-CM | POA: Diagnosis not present

## 2018-08-30 DIAGNOSIS — Z3482 Encounter for supervision of other normal pregnancy, second trimester: Secondary | ICD-10-CM | POA: Diagnosis not present

## 2018-08-30 LAB — GLUCOSE TOLERANCE, 1 HOUR: Glucose, 1 Hour GTT: 155 mg/dL — ABNORMAL HIGH (ref 70–140)

## 2018-08-30 NOTE — Progress Notes (Signed)
HD: 12 27.4 wga Complete previa, second bleed  Still with dark brown discharge, only with wiping; no ctx, pelvic pain, lof Left thigh/buttock pain improved after heat, noticing a little more today but didn't have heat on yesterday  Patient Vitals for the past 24 hrs:  BP Temp Temp src Pulse Resp SpO2  08/30/18 1228 96/66 98 F (36.7 C) Oral 85 16 99 %  08/30/18 0838 109/65 97.7 F (36.5 C) Oral 85 16 100 %  08/29/18 2011 117/81 98.4 F (36.9 C) Oral 81 17 100 %  08/29/18 1541 115/75 98.3 F (36.8 C) Oral 100 18 99 %    FHT:  nsts reviewed; 120s-130s baseline, nml variability, +accels, no decels noted TOCO: no ctx  A/P 27w 4d IUP AGA by sono 10/15 Complete Previa- second bleed. Stable. Gestational anemia improved s/p Feraheme IVF pregnancy BMZ  Complete Continue inpatient status Discussed sicatic pain but also m/s pain; contin sitting in chairs/different positions; pillows to help with position for sleep Failed 1 hr gtt; plan 3 hr  Fetal status reassuring, contin nst tid

## 2018-08-31 NOTE — Progress Notes (Signed)
HD: 13 27.5 wga Complete previa, second bleed  No pain/ cramping. Slight brown vaginal discharge when wipes and some on panty liner. No active/ red blood noted. Good FMs.   BP 108/76 (BP Location: Right Arm)   Pulse 90   Temp 98 F (36.7 C) (Oral)   Resp 16   Ht 5\' 2"  (1.575 m)   Wt 63.5 kg   LMP 12/17/2017 Comment: spotting  SpO2 99%   BMI 25.62 kg/m   A&O x 3,  Lungs CTA bil CV RRR Abdo- gravid relaxed soft uterus Extr- SCD intermittently. No c/c/e or tenderness   FHT:  NSTs- 120s-130s baseline, moderate variability, +accels, no decels noted Toco: no ut cntxs  A/P 27w 5d, IVF pregnancy with complete placenta previa,  2nd bleed admission.  Failed 1hr Glucola, pt wants 3hr GTT on Mon, 10/28, will schedule.  Anemia improved with Feraheme BMZ  Complete Fetal status reassuring, contin nst tid  Continue inpatient status  V.Benjie Karvonen, MD

## 2018-09-01 LAB — TYPE AND SCREEN
ABO/RH(D): A POS
Antibody Screen: NEGATIVE

## 2018-09-01 LAB — GLUCOSE, RANDOM
Glucose, Bld: 161 mg/dL — ABNORMAL HIGH (ref 70–99)
Glucose, Bld: 157 mg/dL — ABNORMAL HIGH (ref 70–99)
Glucose, Bld: 126 mg/dL — ABNORMAL HIGH (ref 70–99)

## 2018-09-01 NOTE — Progress Notes (Signed)
HD: 14 27.6 wga Complete previa, second bleed  No pain/ cramping. Slight brown vaginal discharge. Good FMs.   BP 106/74 (BP Location: Right Arm)   Pulse 84   Temp (!) 97.5 F (36.4 C) (Oral)   Resp 16   Ht 5\' 2"  (1.575 m)   Wt 63.5 kg   LMP 12/17/2017 Comment: spotting  SpO2 100%   BMI 25.62 kg/m   A&O x 3,  Lungs CTA bil CV RRR Abdo- gravid relaxed soft uterus Extr- SCD intermittently. No c/c/e or tenderness   FHT:  NSTs- 120s-130s baseline, moderate variability, +accels, no decels noted Toco: no ut cntxs  A/P 27w 6d, IVF pregnancy with complete placenta previa,  2nd bleed admission.  Failed 1hr Glucola. Doing 3 hr GTT today, but didn't get her fasting today, so just do F Gluc tomorrow but complete the rest today. Anemia improved with Feraheme BMZ  Complete Fetal status reassuring, contin nst tid  Continue inpatient status  V.Benjie Karvonen, MD

## 2018-09-02 LAB — GLUCOSE, FASTING GESTATIONAL: Glucose Tolerance, Fasting: 79 mg/dL

## 2018-09-02 NOTE — Progress Notes (Signed)
3hr GTT normal

## 2018-09-02 NOTE — Progress Notes (Addendum)
HD #15 28.0 wga Complete previa, second bleed  No c/o; still notes dk brown with wiping but no more; no ctx/lof; +FM Wearing scds when sleeping Happy she passed 3 hr gtt  Patient Vitals for the past 24 hrs:  BP Temp Temp src Pulse Resp SpO2  09/02/18 0859 93/62 98.1 F (36.7 C) Oral 86 16 100 %  09/02/18 0451 (!) 99/58 97.6 F (36.4 C) Oral 90 16 97 %  09/01/18 2352 (!) 102/55 98.4 F (36.9 C) - 89 16 98 %  09/01/18 2058 114/67 98 F (36.7 C) Oral 91 16 99 %  09/01/18 1649 102/70 98.9 F (37.2 C) Oral 82 16 97 %  09/01/18 1155 114/71 98.4 F (36.9 C) Oral 87 16 100 %    CBC Latest Ref Rng & Units 08/19/2018 08/10/2018 08/10/2018  WBC 4.0 - 10.5 K/uL 13.9(H) 14.1(H) 13.8(H)  Hemoglobin 12.0 - 15.0 g/dL 11.3(L) 10.9(L) 11.6(L)  Hematocrit 36.0 - 46.0 % 32.9(L) 32.8(L) 34.2(L)  Platelets 150 - 400 K/uL 294 276 288   A&ox3 rrr ctab Abd: soft, nt, gravid LE: no edema, nt bilat  FHT: 130 baseline, normal variability; periods of 10x10 and 15x15 accels, no decels TOCO: no contractions  A/P: iup at 28.0  27w 6d, IVF pregnancy with complete placenta previa,  2nd bleed admission, stable  Failed 1hr Glucola. passed 3 hr GTT today Anemia improved with Feraheme BMZ Complete Fetal status reassuring, contin nst tid  Continue inpatient status

## 2018-09-03 MED ORDER — TETANUS-DIPHTH-ACELL PERTUSSIS 5-2.5-18.5 LF-MCG/0.5 IM SUSP
0.5000 mL | Freq: Once | INTRAMUSCULAR | Status: AC
  Administered 2018-09-04: 0.5 mL via INTRAMUSCULAR
  Filled 2018-09-03: qty 0.5

## 2018-09-03 NOTE — Progress Notes (Signed)
HD # 16 28wks 1d Previa- 2nd bleed  Pt notes mild cramping last night, no cramping this am. Continued dark  brownish dc in last 24 hrs. Good FM. No LOF. NO pain or contractions noted Notes superficial pain anterior left thigh improved but still present at rest. No swelling.  PE:  Vitals:   09/02/18 1940 09/03/18 0618  BP:  90/62  Pulse: 83 82  Resp: 16 18  Temp: 98.3 F (36.8 C) 98.2 F (36.8 C)  SpO2: 98% 95%   CBC    Component Value Date/Time   WBC 13.9 (H) 08/19/2018 1823   RBC 3.36 (L) 08/19/2018 1823   HGB 11.3 (L) 08/19/2018 1823   HCT 32.9 (L) 08/19/2018 1823   PLT 294 08/19/2018 1823   MCV 97.9 08/19/2018 1823   MCH 33.6 08/19/2018 1823   MCHC 34.3 08/19/2018 1823   RDW 14.5 08/19/2018 1823   LYMPHSABS 1.6 03/29/2017 0617   MONOABS 0.7 03/29/2017 0617   EOSABS 0.7 03/29/2017 0617   BASOSABS 0.0 03/29/2017 0617    Gen: well appearing, no distress Neck : supple with FROM Lgs: CTA CV: RRR Abd: gravid, NT No CVAT LE: no edema, neg cords. Left thigh appears well perfused, non tender and no erythema GU: exam deferred, dark brown blood on pad Neuro: nonfocal Skin: intact  Toco: irritability. FH: 135s-150s, + accels, no decels, 5-25 BTBV Reassuring FHR x 3 . NST x 3.  Sono 10/15 AGA, 915gm, nl AFI CL 3.5cm. Complete previa. NL 3gtt  A/P:  28w 1d IUP AGA by sono 10/15 Complete Previa- second bleed. Stable. Gestational anemia improved s/p Feraheme IVF pregnancy BMZ  Complete Continue inpatient status  Lovenia Kim 09/03/2018 6:31 AM

## 2018-09-04 LAB — TYPE AND SCREEN
ABO/RH(D): A POS
Antibody Screen: NEGATIVE

## 2018-09-04 NOTE — Progress Notes (Signed)
Dr. Ronita Hipps notified that pt IV removed because if leaking at the puncture site. Pt requesting to leave IV out. Per MD IV may stay out unless pt starts having VB

## 2018-09-04 NOTE — Progress Notes (Signed)
HD # 17 28wks 2d Previa- 2nd bleed  Pt notes occ mild cramping. No  brownish dc in last 48hrs. Good FM. No LOF. NO pain or contractions noted Notes superficial pain anterior left thigh resolved  PE:  Vitals:   09/03/18 2308 09/04/18 0617  BP:  91/61  Pulse: 80   Resp: 17 16  Temp: 98.5 F (36.9 C) 97.8 F (36.6 C)  SpO2: 99% 100%   CBC    Component Value Date/Time   WBC 13.9 (H) 08/19/2018 1823   RBC 3.36 (L) 08/19/2018 1823   HGB 11.3 (L) 08/19/2018 1823   HCT 32.9 (L) 08/19/2018 1823   PLT 294 08/19/2018 1823   MCV 97.9 08/19/2018 1823   MCH 33.6 08/19/2018 1823   MCHC 34.3 08/19/2018 1823   RDW 14.5 08/19/2018 1823   LYMPHSABS 1.6 03/29/2017 0617   MONOABS 0.7 03/29/2017 0617   EOSABS 0.7 03/29/2017 0617   BASOSABS 0.0 03/29/2017 0617    Gen: well appearing, no distress Neck : supple with FROM Lgs: CTA CV: RRR Abd: gravid, NT No CVAT LE: no edema, neg cords.  GU: exam deferred, Neuro: nonfocal Skin: intact  Toco: Occ irritability. FH: 135s-150s, + accels, no decels, 5-25 BTBV Reassuring FHR x 3 . NST x 3.  Sono 10/15 AGA, 915gm, nl AFI CL 3.5cm. Complete previa. NL 3gtt  A/P:  28w 2d IUP AGA by sono 10/15 Complete Previa- second bleed. Stable. No bleeding or dc x 48hrs Gestational anemia improved s/p Feraheme IVF pregnancy BMZ  Complete Continue inpatient status  Jacqueline [name] J 09/04/2018 6:29 AM

## 2018-09-05 NOTE — Progress Notes (Signed)
HD # 18 28wks 3d Previa- 2nd bleed  Pt notes occ mild cramping. No  brownish dc in last 72hrs. Good FM. No LOF. NO pain or contractions noted Notes superficial pain anterior left thigh resolved  PE:  Vitals:   09/04/18 2318 09/05/18 0518  BP: 99/69 96/63  Pulse: 86 71  Resp: 17 18  Temp: 98.1 F (36.7 C) 98.5 F (36.9 C)  SpO2: 100% 99%   CBC    Component Value Date/Time   WBC 13.9 (H) 08/19/2018 1823   RBC 3.36 (L) 08/19/2018 1823   HGB 11.3 (L) 08/19/2018 1823   HCT 32.9 (L) 08/19/2018 1823   PLT 294 08/19/2018 1823   MCV 97.9 08/19/2018 1823   MCH 33.6 08/19/2018 1823   MCHC 34.3 08/19/2018 1823   RDW 14.5 08/19/2018 1823   LYMPHSABS 1.6 03/29/2017 0617   MONOABS 0.7 03/29/2017 0617   EOSABS 0.7 03/29/2017 0617   BASOSABS 0.0 03/29/2017 0617    Gen: well appearing, no distress Neck : supple with FROM Lgs: CTA CV: RRR Abd: gravid, NT No CVAT LE: no edema, neg cords.  GU: exam deferred, Neuro: nonfocal Skin: intact  Toco: Occ irritability. FH: 135s-150s, + accels, no decels, 5-25 BTBV Reassuring FHR x 3 . NST x 3.  Sono 10/15 AGA, 915gm, nl AFI CL 3.5cm. Complete previa. NL 3gtt  A/P:  28w 3d IUP AGA by sono 10/15 Complete Previa- second bleed. Stable. No bleeding or dc x 72hrs Gestational anemia improved s/p Feraheme IVF pregnancy BMZ  Complete DC home  Jacqueline [name] J 09/05/2018 6:33 AM

## 2018-09-05 NOTE — Progress Notes (Signed)
Patient given discharge instructions, fetal kick counts, pt to make f/u appoint with taavon, medications reviewed. Pt to return for evaluation in MAU If VB occurs/spotting, contractions more than 4-5/hr, ROM. or decreased FM. Pt verbalizes understanding.

## 2018-09-10 ENCOUNTER — Encounter (HOSPITAL_COMMUNITY): Payer: Self-pay | Admitting: *Deleted

## 2018-09-10 ENCOUNTER — Inpatient Hospital Stay (HOSPITAL_COMMUNITY)
Admission: AD | Admit: 2018-09-10 | Discharge: 2018-10-03 | DRG: 787 | Disposition: A | Discharge status: Home / Self Care | Payer: BLUE CROSS/BLUE SHIELD | Attending: Obstetrics and Gynecology | Admitting: Obstetrics and Gynecology

## 2018-09-10 ENCOUNTER — Other Ambulatory Visit: Payer: Self-pay

## 2018-09-10 DIAGNOSIS — O328XX0 Maternal care for other malpresentation of fetus, not applicable or unspecified: Secondary | ICD-10-CM

## 2018-09-10 DIAGNOSIS — R079 Chest pain, unspecified: Secondary | ICD-10-CM

## 2018-09-10 DIAGNOSIS — Z3A29 29 weeks gestation of pregnancy: Secondary | ICD-10-CM

## 2018-09-10 DIAGNOSIS — O8612 Endometritis following delivery: Secondary | ICD-10-CM

## 2018-09-10 DIAGNOSIS — Z3490 Encounter for supervision of normal pregnancy, unspecified, unspecified trimester: Secondary | ICD-10-CM

## 2018-09-10 DIAGNOSIS — O4413 Placenta previa with hemorrhage, third trimester: Principal | ICD-10-CM

## 2018-09-10 DIAGNOSIS — O4593 Premature separation of placenta, unspecified, third trimester: Secondary | ICD-10-CM

## 2018-09-10 DIAGNOSIS — O42919 Preterm premature rupture of membranes, unspecified as to length of time between rupture and onset of labor, unspecified trimester: Secondary | ICD-10-CM

## 2018-09-10 DIAGNOSIS — O4403 Placenta previa specified as without hemorrhage, third trimester: Secondary | ICD-10-CM

## 2018-09-10 DIAGNOSIS — L259 Unspecified contact dermatitis, unspecified cause: Secondary | ICD-10-CM

## 2018-09-10 DIAGNOSIS — O26893 Other specified pregnancy related conditions, third trimester: Secondary | ICD-10-CM

## 2018-09-10 DIAGNOSIS — N939 Abnormal uterine and vaginal bleeding, unspecified: Secondary | ICD-10-CM

## 2018-09-10 DIAGNOSIS — D62 Acute posthemorrhagic anemia: Secondary | ICD-10-CM

## 2018-09-10 DIAGNOSIS — O9081 Anemia of the puerperium: Secondary | ICD-10-CM

## 2018-09-10 DIAGNOSIS — O322XX0 Maternal care for transverse and oblique lie, not applicable or unspecified: Secondary | ICD-10-CM

## 2018-09-10 DIAGNOSIS — O9972 Diseases of the skin and subcutaneous tissue complicating childbirth: Secondary | ICD-10-CM

## 2018-09-10 DIAGNOSIS — O09813 Supervision of pregnancy resulting from assisted reproductive technology, third trimester: Secondary | ICD-10-CM | POA: Diagnosis not present

## 2018-09-10 DIAGNOSIS — Z3A30 30 weeks gestation of pregnancy: Secondary | ICD-10-CM | POA: Diagnosis not present

## 2018-09-10 DIAGNOSIS — O4693 Antepartum hemorrhage, unspecified, third trimester: Secondary | ICD-10-CM | POA: Diagnosis not present

## 2018-09-10 DIAGNOSIS — O4290 Premature rupture of membranes, unspecified as to length of time between rupture and onset of labor, unspecified weeks of gestation: Secondary | ICD-10-CM | POA: Diagnosis not present

## 2018-09-10 DIAGNOSIS — O411230 Chorioamnionitis, third trimester, not applicable or unspecified: Secondary | ICD-10-CM | POA: Diagnosis not present

## 2018-09-10 DIAGNOSIS — Z3A31 31 weeks gestation of pregnancy: Secondary | ICD-10-CM | POA: Diagnosis not present

## 2018-09-10 DIAGNOSIS — Z3A32 32 weeks gestation of pregnancy: Secondary | ICD-10-CM | POA: Diagnosis not present

## 2018-09-10 LAB — CBC
HCT: 32.7 % — ABNORMAL LOW (ref 36.0–46.0)
Hemoglobin: 11.3 g/dL — ABNORMAL LOW (ref 12.0–15.0)
MCH: 34.3 pg — ABNORMAL HIGH (ref 26.0–34.0)
MCHC: 34.6 g/dL (ref 30.0–36.0)
MCV: 99.4 fL (ref 80.0–100.0)
Platelets: 224 10*3/uL (ref 150–400)
RBC: 3.29 MIL/uL — ABNORMAL LOW (ref 3.87–5.11)
RDW: 13.9 % (ref 11.5–15.5)
WBC: 11.1 10*3/uL — ABNORMAL HIGH (ref 4.0–10.5)
nRBC: 0 % (ref 0.0–0.2)

## 2018-09-10 LAB — TYPE AND SCREEN
ABO/RH(D): A POS
Antibody Screen: NEGATIVE

## 2018-09-10 MED ORDER — ACETAMINOPHEN 325 MG PO TABS
650.0000 mg | ORAL_TABLET | ORAL | Status: DC | PRN
  Administered 2018-09-10 – 2018-09-29 (×10): 650 mg via ORAL
  Filled 2018-09-10 (×10): qty 2

## 2018-09-10 MED ORDER — SODIUM CHLORIDE 0.9 % IV SOLN: 250.0000 mL | INTRAVENOUS | Status: DC | PRN

## 2018-09-10 MED ORDER — PRENATAL MULTIVITAMIN CH
1.0000 | ORAL_TABLET | Freq: Every day | ORAL | Status: DC
  Administered 2018-09-10 – 2018-09-29 (×20): 1 via ORAL
  Filled 2018-09-10 (×21): qty 1

## 2018-09-10 MED ORDER — DOCUSATE SODIUM 100 MG PO CAPS
100.0000 mg | ORAL_CAPSULE | Freq: Every day | ORAL | Status: DC
  Administered 2018-09-10 – 2018-09-29 (×19): 100 mg via ORAL
  Filled 2018-09-10 (×20): qty 1

## 2018-09-10 MED ORDER — NIFEDIPINE 10 MG PO CAPS
10.0000 mg | ORAL_CAPSULE | Freq: Once | ORAL | Status: AC
  Administered 2018-09-10: 10 mg via ORAL
  Filled 2018-09-10: qty 1

## 2018-09-10 MED ORDER — NIFEDIPINE 10 MG PO CAPS
10.0000 mg | ORAL_CAPSULE | Freq: Once | ORAL | Status: AC
  Administered 2018-09-10: 10 mg via ORAL

## 2018-09-10 MED ORDER — SODIUM CHLORIDE 0.9% FLUSH
3.0000 mL | INTRAVENOUS | Status: DC | PRN
  Administered 2018-09-16: 3 mL via INTRAVENOUS
  Filled 2018-09-10: qty 3

## 2018-09-10 MED ORDER — NIFEDIPINE 10 MG PO CAPS: 10.0000 mg | ORAL_CAPSULE | Freq: Four times a day (QID) | ORAL | Status: DC

## 2018-09-10 MED ORDER — ZOLPIDEM TARTRATE 5 MG PO TABS: 5.0000 mg | ORAL_TABLET | Freq: Every evening | ORAL | Status: DC | PRN

## 2018-09-10 MED ORDER — SODIUM CHLORIDE 0.9% FLUSH
3.0000 mL | Freq: Two times a day (BID) | INTRAVENOUS | Status: DC
  Administered 2018-09-10 – 2018-09-29 (×31): 3 mL via INTRAVENOUS

## 2018-09-10 MED ORDER — CALCIUM CARBONATE ANTACID 500 MG PO CHEW: 2.0000 | CHEWABLE_TABLET | ORAL | Status: DC | PRN

## 2018-09-10 MED ORDER — NIFEDIPINE 10 MG PO CAPS
10.0000 mg | ORAL_CAPSULE | Freq: Four times a day (QID) | ORAL | Status: AC
  Administered 2018-09-10 – 2018-09-11 (×4): 10 mg via ORAL
  Filled 2018-09-10 (×5): qty 1

## 2018-09-10 NOTE — Progress Notes (Addendum)
Dr Ronita Hipps notified of uterine irritability.  Orders for procardia given and to monitor for 30 more minutes.

## 2018-09-10 NOTE — Discharge Summary (Signed)
NAMENAO, LINZ MEDICAL RECORD HQ:[id number] ACCOUNT [id number] DATE OF BIRTH:13-May-1991 FACILITY: [facility] LOCATION: [id number] PHYSICIAN:Aadan Chenier J. Ronita Hipps, MD  DISCHARGE SUMMARY  DATE OF DISCHARGE:  09/05/2018  ADMITTING DIAGNOSES:  A 26 week intrauterine pregnancy, placenta previa with second episode of bleeding.  DISCHARGE DIAGNOSES.  A 26-week intrauterine pregnancy, placenta previa with second episode of bleeding.  HOSPITAL COURSE: The patient was admitted with second episode of acute bleeding on 08/20/2018.  During admission, bleeding slowed and became brownish discharge predominantly for her entire stay.  Three days prior to discharge, there was no bleeding noted.  Fetal heart  status was category 1.  Growth ultrasound done on 08/20/2018 was normal.  The patient had steroids administered on a previous admission.  She had no further bleeding upon discharge.  She was discharged home on bed rest under 24-hour surveillance.  DISCHARGE MEDICATIONS:  Include prenatal vitamins, iron.  Discharge teaching was done.  She is to follow up in the office in 1 week.  She is on bed rest and bathroom privileges.  AN/NUANCE D:09/10/2018 T:09/10/2018 JOB:[id number]

## 2018-09-10 NOTE — H&P (Addendum)
History   HISTORY AND PHYSICAL EXAM  CSN: [id number]  Arrival date and time: 09/10/18 0321  First Provider Initiated Contact with Patient 08/19/18 1758      Chief Complaint  Patient presents with  . Vaginal Bleeding   Jacqueline [name] is a 27 y.o. G2P0 at [redacted]w[redacted]d who presents to MAU with complaints of vaginal bleeding. She has a known complete previa during this pregnancy and was recent discharged on 10/31. Received BMZ on 10/5, 10/6. This is her third episode of bleeding and was heavier than the other two episodes prompting admission.Has known complete previa documented on last admission with nl CL. Denies contractions. She reports vaginal bleeding started this morning with a gush of BRB. Went to the restroom and continued to trickle then placed sanitary napkin on and came to MAU. She describes vaginal bleeding as bright red bleeding with no clots. She reports +FM. Denies abdominal pain or LOF.  Vaginal Bleeding  The patient's pertinent negatives include no pelvic pain. Pertinent negatives include no dysuria, frequency or urgency.    OB History    Gravida  2   Para  0   Term  0   Preterm  0   AB  1   Living  0     SAB  1   TAB  0   Ectopic  0   Multiple  0   Live Births              Past Medical History:  Diagnosis Date  . Allergic rhinitis   . Endometriosis   . Thyroid nodule     Past Surgical History:  Procedure Laterality Date  . CHROMOPERTUBATION Bilateral 03/20/2017   Procedure: CHROMOPERTUBATION;  Surgeon: Megan Salon, MD;  Location: [facility];  Service: Gynecology;  Laterality: Bilateral;  Fallopian tubes  . CYSTOSCOPY N/A 03/20/2017   Procedure: CYSTOSCOPY;  Surgeon: Megan Salon, MD;  Location: [facility];  Service: Gynecology;  Laterality: N/A;  . FOOT SURGERY Left    neuromona  . LAPAROSCOPIC OVARIAN CYSTECTOMY Bilateral 03/20/2017   Procedure: LAPAROSCOPIC OVARIAN CYSTECTOMY;  Surgeon: Megan Salon, MD;  Location: [facility];  Service: Gynecology;   Laterality: Bilateral;  . LAPAROSCOPIC UNILATERAL SALPINGECTOMY Right 03/20/2017   Procedure: LAPAROSCOPIC UNILATERAL SALPINGECTOMY;  Surgeon: Megan Salon, MD;  Location: [facility];  Service: Gynecology;  Laterality: Right;  . LAPAROSCOPY Bilateral 03/20/2017   Procedure: LAPAROSCOPY OPERATIVE  WITH LYSIS OF ADHESIONS;  Surgeon: Megan Salon, MD;  Location: [facility];  Service: Gynecology;  Laterality: Bilateral;  . WISDOM TOOTH EXTRACTION      Family History  Problem Relation Age of Onset  . Breast cancer Mother 70        mastectomy  . Thyroid disease Neg Hx     Social History   Tobacco Use  . Smoking status: Never Smoker  . Smokeless tobacco: Never Used  Substance Use Topics  . Alcohol use: Not Currently    Alcohol/week: 1.0 - 3.0 standard drinks    Types: 1 - 3 Standard drinks or equivalent per week    Comment: occasional   . Drug use: No    Allergies:  Allergies  Allergen Reactions  . Shellfish Allergy Anaphylaxis  . Amoxicillin-Pot Clavulanate Other (See Comments)    Does not remember  . Ciprofloxacin     Caused QT prolongation    Medications Prior to Admission  Medication Sig Dispense Refill Last Dose  . acetaminophen (TYLENOL) 325 MG tablet Take 650 mg by  mouth every 6 (six) hours as needed for moderate pain.   Past Week at Unknown time  . calcium carbonate (TUMS - DOSED IN MG ELEMENTAL CALCIUM) 500 MG chewable tablet Chew 2 tablets by mouth 2 (two) times daily as needed for indigestion or heartburn.   Past Week at Unknown time  . loratadine (CLARITIN) 10 MG tablet Take 10 mg by mouth daily.   Past Week at Unknown time  . Prenatal Vit-Fe Fumarate-FA (PRENATAL MULTIVITAMIN) TABS tablet Take 1 tablet by mouth daily at 12 noon.   Past Week at Unknown time    Review of Systems  Constitutional: Negative.   Respiratory: Negative.   Cardiovascular: Negative.   Gastrointestinal: Negative.   Genitourinary: Positive for vaginal bleeding. Negative for difficulty urinating,  dysuria, frequency, pelvic pain and urgency.  Musculoskeletal: Negative.   Neurological: Negative.    Physical Exam   Blood pressure 100/70, pulse 87, temperature 98.7 F (37.1 C), temperature source Oral, resp. rate 18, height 5\' 2"  (1.575 m), weight 64.9 kg, last menstrual period 12/17/2017, SpO2 99 %.  Physical Exam  Nursing note and vitals reviewed. Constitutional: She is oriented to person, place, and time. She appears well-developed and well-nourished. No distress.  HENT:  Head: Normocephalic and atraumatic.  Neck: Normal range of motion. Neck supple.  Cardiovascular: Normal rate, regular rhythm and normal heart sounds.  Respiratory: Effort normal. No respiratory distress. She has no wheezes.  GI: Soft. Bowel sounds are normal. There is no tenderness. There is no rebound.  Gravid appropriate for gestational age  Genitourinary: Vagina normal and uterus normal.  Genitourinary Comments: Pelvic exam: cervix visually closed, no active bleeding noted from cervical os, moderate amount of dark red bleeding pooling under cervix (4 faux swabs used to visualize cervix)   Musculoskeletal: Normal range of motion. She exhibits no edema.  Neurological: She is alert and oriented to person, place, and time.  Skin: Skin is warm and dry.  Psychiatric: She has a normal mood and affect. Her behavior is normal. Thought content normal.   FHR: 130/ moderate variability/ +accels/ 1 variable deceleration Toco: 1 mild uterine contraction noted  MAU Course  Procedures  MDM Sterile speculum examination  CBC, Type and screen  ABO: A Pos, no rhogam needed IV saline lock   CBC CBC    Component Value Date/Time   WBC 11.1 (H) 09/10/2018 0401   RBC 3.29 (L) 09/10/2018 0401   HGB 11.3 (L) 09/10/2018 0401   HCT 32.7 (L) 09/10/2018 0401   PLT 224 09/10/2018 0401   MCV 99.4 09/10/2018 0401   MCH 34.3 (H) 09/10/2018 0401   MCHC 34.6 09/10/2018 0401   RDW 13.9 09/10/2018 0401   LYMPHSABS 1.6  03/29/2017 0617   MONOABS 0.7 03/29/2017 0617   EOSABS 0.7 03/29/2017 0617   BASOSABS 0.0 03/29/2017 0617    Growth sono done 10/15  Category 1 tracing. FHR 140s. Accels noted. No decels UI and occ contractions noted     Assessment and Plan  29w 1d week IUP 1. Complete previa  2. Vaginal bleeding - third episode. Minimal bleeding now 3. IVF pregnancy  Admit to antenatal  Procardia x 24 hrs CBC and TS Continuous EFM TOCO to rule out PTL Bedrest with BRP Will consider neuroprophylaxis Inpatient until delivery   Anaiyah Anglemyer J CNM 09/10/2018, 6:54 AM

## 2018-09-10 NOTE — MAU Provider Note (Signed)
History     CSN: [id number]  Arrival date and time: 09/10/18 0321   First Provider Initiated Contact with Patient 09/10/18 [phone]      Chief Complaint  Patient presents with  . Vaginal Bleeding   Jacqueline [name] is a 27 y.o. G2P0 at [redacted]w[redacted]d who presents to MAU with complaints of vaginal bleeding. She reports vaginal bleeding started this morning around 0315, woke up to watery brown discharge then stood up and a large gush of bright red vaginal blood occurred that soaked through clothes. Placed pad and immediately presented to MAU where she continued to soak through 1 pad. She denies passing clots. Reports +FM. Reports vaginal bleeding is associated with abdominal cramping. Denies having painful contractions, rates lower abdominal cramping as 2/10. Denies LOF. Pregnancy is complicated by complete previa. This is patient's third bleed and admission.    OB History    Gravida  2   Para  0   Term  0   Preterm  0   AB  1   Living  0     SAB  1   TAB  0   Ectopic  0   Multiple  0   Live Births              Past Medical History:  Diagnosis Date  . Allergic rhinitis   . Endometriosis   . Thyroid nodule     Past Surgical History:  Procedure Laterality Date  . CHROMOPERTUBATION Bilateral 03/20/2017   Procedure: CHROMOPERTUBATION;  Surgeon: Megan Salon, MD;  Location: [facility];  Service: Gynecology;  Laterality: Bilateral;  Fallopian tubes  . CYSTOSCOPY N/A 03/20/2017   Procedure: CYSTOSCOPY;  Surgeon: Megan Salon, MD;  Location: [facility];  Service: Gynecology;  Laterality: N/A;  . FOOT SURGERY Left    neuromona  . LAPAROSCOPIC OVARIAN CYSTECTOMY Bilateral 03/20/2017   Procedure: LAPAROSCOPIC OVARIAN CYSTECTOMY;  Surgeon: Megan Salon, MD;  Location: [facility];  Service: Gynecology;  Laterality: Bilateral;  . LAPAROSCOPIC UNILATERAL SALPINGECTOMY Right 03/20/2017   Procedure: LAPAROSCOPIC UNILATERAL SALPINGECTOMY;  Surgeon: Megan Salon, MD;  Location: [facility];  Service:  Gynecology;  Laterality: Right;  . LAPAROSCOPY Bilateral 03/20/2017   Procedure: LAPAROSCOPY OPERATIVE  WITH LYSIS OF ADHESIONS;  Surgeon: Megan Salon, MD;  Location: [facility];  Service: Gynecology;  Laterality: Bilateral;  . WISDOM TOOTH EXTRACTION      Family History  Problem Relation Age of Onset  . Breast cancer Mother 26        mastectomy  . Thyroid disease Neg Hx     Social History   Tobacco Use  . Smoking status: Never Smoker  . Smokeless tobacco: Never Used  Substance Use Topics  . Alcohol use: Yes    Alcohol/week: 1.0 - 3.0 standard drinks    Types: 1 - 3 Standard drinks or equivalent per week    Comment: occasional   . Drug use: No    Allergies:  Allergies  Allergen Reactions  . Shellfish Allergy Anaphylaxis  . Amoxicillin-Pot Clavulanate Other (See Comments)    Does not remember  . Ciprofloxacin     Caused QT prolongation    Medications Prior to Admission  Medication Sig Dispense Refill Last Dose  . acetaminophen (TYLENOL) 325 MG tablet Take 650 mg by mouth every 6 (six) hours as needed for moderate pain.   Past Week at Unknown time  . calcium carbonate (TUMS - DOSED IN MG ELEMENTAL CALCIUM) 500 MG chewable tablet Chew 2  tablets by mouth 2 (two) times daily as needed for indigestion or heartburn.   Past Week at Unknown time  . loratadine (CLARITIN) 10 MG tablet Take 10 mg by mouth daily.   Past Week at Unknown time  . Prenatal Vit-Fe Fumarate-FA (PRENATAL MULTIVITAMIN) TABS tablet Take 1 tablet by mouth daily at 12 noon.   Past Week at Unknown time    Review of Systems  Constitutional: Negative.   Respiratory: Negative.   Cardiovascular: Negative.   Gastrointestinal: Positive for abdominal pain. Negative for constipation, diarrhea, nausea and vomiting.  Genitourinary: Positive for vaginal bleeding. Negative for difficulty urinating, dysuria, frequency, hematuria, pelvic pain and vaginal pain.   Physical Exam   Last menstrual period 12/17/2017, SpO2 99  %.  Physical Exam  Nursing note and vitals reviewed. Constitutional: She is oriented to person, place, and time. She appears well-developed and well-nourished. No distress.  Cardiovascular: Normal rate, regular rhythm and normal heart sounds.  Respiratory: Effort normal and breath sounds normal. No respiratory distress. She has no wheezes.  GI: Soft. There is no tenderness.  Gravid appropriate for gestational age, no contractions palpated   Genitourinary: There is bleeding in the vagina.  Genitourinary Comments: Pelvic examination: moderate amount of vaginal bleeding present upon insertion of speculum (3 faux swabs used to visually cervix), cervix visually closed, small trickle of dark red active bleeding coming from cervix os.   Neurological: She is alert and oriented to person, place, and time.  Psychiatric: She has a normal mood and affect. Her behavior is normal. Thought content normal.   FHR: 135/ moderate/ +accels no decelerations  Toco: UI  MAU Course  Procedures  MDM Sterile speculum examination  Procardia 10mg  for UI  Saline lock and admission labs  Admission to antenatal for complete previa   Dr Ronita Hipps notified and aware of admission  Orders placed for antenatal admission  Care assumed by Dr Ronita Hipps   Assessment and Plan  1. Complete previa with bleeding, third trimester   Admit to antepartum  Orders placed  Care taken over by Dr Ronita Hipps    Lajean Manes CNM 09/10/2018, 4:12 AM

## 2018-09-10 NOTE — MAU Note (Addendum)
Pt presents to MAU c/o vaginal bleeding x3 with a complete previa. Pt states at 0300 she noticed a watery brown discharge that then suddenly started bright red bleeding. Pt put on a pad and came straight to the hospital and it was saturated by the time she arrived. Pt report +FM no other complaints.  Pt also reports occasional pain.

## 2018-09-10 NOTE — Plan of Care (Signed)
  Problem: Education: Goal: Knowledge of disease or condition will improve Outcome: Progressing   

## 2018-09-11 NOTE — Progress Notes (Signed)
HD # 18 29wks 2d Previa- 3rd bleed  Pt notes occ mild cramping. Continued brown dc, no BRB. Good FM. No LOF. NO pain or contractions noted  PE:  Vitals:   09/10/18 2027 09/10/18 2309  BP: 101/63 100/62  Pulse: 86 68  Resp: 16 18  Temp: 98.6 F (37 C) 98.6 F (37 C)  SpO2: 98% 98%   CBC    Component Value Date/Time   WBC 11.1 (H) 09/10/2018 0401   RBC 3.29 (L) 09/10/2018 0401   HGB 11.3 (L) 09/10/2018 0401   HCT 32.7 (L) 09/10/2018 0401   PLT 224 09/10/2018 0401   MCV 99.4 09/10/2018 0401   MCH 34.3 (H) 09/10/2018 0401   MCHC 34.6 09/10/2018 0401   RDW 13.9 09/10/2018 0401   LYMPHSABS 1.6 03/29/2017 0617   MONOABS 0.7 03/29/2017 0617   EOSABS 0.7 03/29/2017 0617   BASOSABS 0.0 03/29/2017 0617    Gen: well appearing, no distress Neck : supple with FROM Lgs: CTA CV: RRR Abd: gravid, NT No CVAT LE: no edema, neg cords.  GU: exam deferred, Neuro: nonfocal Skin: intact  Toco: Occ irritability. FH: 125s-130s, + accels, no decels, 5-25 BTBV Reassuring FHR x 3 . NST x 3.  Sono 10/15 AGA, 915gm, nl AFI CL 3.5cm. Complete previa. NL 3gtt  A/P:  29w 2d IUP AGA by sono 10/15 Complete Previa- third bleed. Stable.  Gestational anemia improved s/p Feraheme IVF pregnancy BMZ  Complete Inpatient until delivery  Ahmaud Duthie J 09/11/2018 6:15 AM

## 2018-09-12 NOTE — Progress Notes (Signed)
HD # 19 29wks 3d Previa- 3rd bleed  Pt notes occ mild cramping. Continued brown dc, no BRB. Good FM. No LOF. NO pain or contractions noted  PE:  Vitals:   09/11/18 1620 09/11/18 1921  BP: 103/72 97/65  Pulse: 84 84  Resp: 18 17  Temp: 98.5 F (36.9 C) 98.4 F (36.9 C)  SpO2: 98% 99%   CBC    Component Value Date/Time   WBC 11.1 (H) 09/10/2018 0401   RBC 3.29 (L) 09/10/2018 0401   HGB 11.3 (L) 09/10/2018 0401   HCT 32.7 (L) 09/10/2018 0401   PLT 224 09/10/2018 0401   MCV 99.4 09/10/2018 0401   MCH 34.3 (H) 09/10/2018 0401   MCHC 34.6 09/10/2018 0401   RDW 13.9 09/10/2018 0401   LYMPHSABS 1.6 03/29/2017 0617   MONOABS 0.7 03/29/2017 0617   EOSABS 0.7 03/29/2017 0617   BASOSABS 0.0 03/29/2017 0617    Gen: well appearing, no distress Neck : supple with FROM Lgs: CTA CV: RRR Abd: gravid, NT No CVAT LE: no edema, neg cords.  GU: exam deferred, Neuro: nonfocal Skin: intact  Toco: Occ irritability. FH: 125s-130s, + accels, no decels, 5-25 BTBV Reassuring FHR x 3 . NST x 3.  Sono 10/15 AGA, 915gm, nl AFI CL 3.5cm. Complete previa. NL 3gtt  A/P:  29w 3d IUP AGA by sono 10/15 Complete Previa- third bleed. Stable.  Gestational anemia improved s/p Feraheme IVF pregnancy BMZ  Complete Inpatient until delivery  Shenicka Sunderlin J 09/12/2018 8:07 AM

## 2018-09-13 NOTE — Progress Notes (Signed)
HD # 2 29wks 4d Previa- 3rd bleed  Pt notes occ mild cramping. Continued brown dc, no BRB. Good FM. No LOF. NO pain or contractions noted  PE:  Vitals:   09/12/18 1948 09/13/18 0642  BP: 108/61 106/66  Pulse: 89 96  Resp: 17 17  Temp: 98.4 F (36.9 C) 98.2 F (36.8 C)  SpO2: 99%    CBC    Component Value Date/Time   WBC 11.1 (H) 09/10/2018 0401   RBC 3.29 (L) 09/10/2018 0401   HGB 11.3 (L) 09/10/2018 0401   HCT 32.7 (L) 09/10/2018 0401   PLT 224 09/10/2018 0401   MCV 99.4 09/10/2018 0401   MCH 34.3 (H) 09/10/2018 0401   MCHC 34.6 09/10/2018 0401   RDW 13.9 09/10/2018 0401   LYMPHSABS 1.6 03/29/2017 0617   MONOABS 0.7 03/29/2017 0617   EOSABS 0.7 03/29/2017 0617   BASOSABS 0.0 03/29/2017 0617    Gen: well appearing, no distress Neck : supple with FROM Lgs: CTA CV: RRR Abd: gravid, NT No CVAT LE: no edema, neg cords.  GU: exam deferred, Neuro: nonfocal Skin: intact  Toco: Occ irritability. FH: 125s-130s, + accels, no decels, 5-25 BTBV Reassuring FHR x 3 . NST x 3.  Sono 10/15 AGA, 915gm, nl AFI CL 3.5cm. Complete previa. NL 3gtt  A/P:  29w 4d IUP AGA by sono 10/15 Complete Previa- third bleed. Stable.  Gestational anemia improved s/p Feraheme IVF pregnancy BMZ  Complete Inpatient until delivery  [name] J 09/13/2018 6:49 AM

## 2018-09-14 NOTE — Progress Notes (Signed)
HD # 3 29wks 5d Previa- 3rd bleed  Pt notes occ mild cramping. Continued brown dc, no BRB. Good FM. No LOF. NO pain or contractions noted  PE:  Vitals:   09/13/18 2010 09/14/18 0811  BP:  96/72  Pulse: 81 87  Resp: 17 16  Temp: 97.9 F (36.6 C) 98.4 F (36.9 C)  SpO2: 99% 100%   CBC    Component Value Date/Time   WBC 11.1 (H) 09/10/2018 0401   RBC 3.29 (L) 09/10/2018 0401   HGB 11.3 (L) 09/10/2018 0401   HCT 32.7 (L) 09/10/2018 0401   PLT 224 09/10/2018 0401   MCV 99.4 09/10/2018 0401   MCH 34.3 (H) 09/10/2018 0401   MCHC 34.6 09/10/2018 0401   RDW 13.9 09/10/2018 0401   LYMPHSABS 1.6 03/29/2017 0617   MONOABS 0.7 03/29/2017 0617   EOSABS 0.7 03/29/2017 0617   BASOSABS 0.0 03/29/2017 0617    Gen: well appearing, no distress Neck : supple with FROM Lgs: CTA CV: RRR Abd: gravid, NT No CVAT LE: no edema, neg cords.  GU: exam deferred, Neuro: nonfocal Skin: intact  Toco: Occ irritability. FH: 125s-130s, + accels, no decels, 5-25 BTBV Reassuring FHR x 3 . NST x 3.  Sono 10/15 AGA, 915gm, nl AFI CL 3.5cm. Complete previa. NL 3gtt  A/P:  29w 5d IUP AGA by sono 10/15 Complete Previa- third bleed. Stable.  Gestational anemia improved s/p Feraheme IVF pregnancy BMZ  Complete Inpatient until delivery  Sharnay Cashion J 09/14/2018 12:05 PM

## 2018-09-15 LAB — TYPE AND SCREEN
ABO/RH(D): A POS
Antibody Screen: NEGATIVE

## 2018-09-15 NOTE — Progress Notes (Signed)
HD # 4 29wks 6d Previa- 3rd bleed  Pt notes occ mild cramping. Continued minimum brown dc, no BRB. Good FM. No LOF. NO pain or contractions noted  PE:  Vitals:   09/15/18 0519 09/15/18 0808  BP: 93/64 101/72  Pulse: 77 83  Resp: 16 16  Temp: 98.2 F (36.8 C) 98.3 F (36.8 C)  SpO2: 100% 100%   CBC    Component Value Date/Time   WBC 11.1 (H) 09/10/2018 0401   RBC 3.29 (L) 09/10/2018 0401   HGB 11.3 (L) 09/10/2018 0401   HCT 32.7 (L) 09/10/2018 0401   PLT 224 09/10/2018 0401   MCV 99.4 09/10/2018 0401   MCH 34.3 (H) 09/10/2018 0401   MCHC 34.6 09/10/2018 0401   RDW 13.9 09/10/2018 0401   LYMPHSABS 1.6 03/29/2017 0617   MONOABS 0.7 03/29/2017 0617   EOSABS 0.7 03/29/2017 0617   BASOSABS 0.0 03/29/2017 0617    Gen: well appearing, no distress Neck : supple with FROM Lgs: CTA CV: RRR Abd: gravid, NT No CVAT LE: no edema, neg cords.  GU: exam deferred, Neuro: nonfocal Skin: intact  Toco: Occ irritability. Rare contraction noted FH: 125s-130s, + accels, no decels, 5-25 BTBV Category 1 tracing  x 3 . NST x 3.  Sono 10/15 AGA, 915gm, nl AFI CL 3.5cm. Complete previa. NL 3gtt  A/P:  29w 6d IUP AGA by sono 10/15 Complete Previa- third bleed. Stable.  Gestational anemia improved s/p Feraheme IVF pregnancy BMZ  Complete Inpatient until delivery  Darryel Diodato J 09/15/2018 9:06 AM

## 2018-09-16 ENCOUNTER — Inpatient Hospital Stay (HOSPITAL_BASED_OUTPATIENT_CLINIC_OR_DEPARTMENT_OTHER): Payer: BLUE CROSS/BLUE SHIELD

## 2018-09-16 DIAGNOSIS — O4403 Placenta previa specified as without hemorrhage, third trimester: Secondary | ICD-10-CM

## 2018-09-16 DIAGNOSIS — O4693 Antepartum hemorrhage, unspecified, third trimester: Secondary | ICD-10-CM

## 2018-09-16 DIAGNOSIS — Z3A30 30 weeks gestation of pregnancy: Secondary | ICD-10-CM

## 2018-09-16 NOTE — Progress Notes (Addendum)
HD # 7 30wks 0d Previa- 3rd bleed  Pt notes occ mild cramping. Continued minimum brown dc, no BRB. Good FM. No LOF. NO pain or contractions noted  PE:  Vitals:   09/15/18 2100 09/16/18 0529  BP:  (!) 103/59  Pulse: 97 89  Resp: 17 16  Temp: 98.2 F (36.8 C) 97.7 F (36.5 C)  SpO2: 99% 98%   CBC    Component Value Date/Time   WBC 11.1 (H) 09/10/2018 0401   RBC 3.29 (L) 09/10/2018 0401   HGB 11.3 (L) 09/10/2018 0401   HCT 32.7 (L) 09/10/2018 0401   PLT 224 09/10/2018 0401   MCV 99.4 09/10/2018 0401   MCH 34.3 (H) 09/10/2018 0401   MCHC 34.6 09/10/2018 0401   RDW 13.9 09/10/2018 0401   LYMPHSABS 1.6 03/29/2017 0617   MONOABS 0.7 03/29/2017 0617   EOSABS 0.7 03/29/2017 0617   BASOSABS 0.0 03/29/2017 0617    Gen: well appearing, no distress Neck : supple with FROM Lgs: CTA CV: RRR Abd: gravid, NT No CVAT LE: no edema, neg cords.  GU: exam deferred, Neuro: nonfocal Skin: intact  Toco: Occ irritability. Rare contraction noted FH: 125s-130s, + accels, no decels, 5-25 BTBV Category 1 tracing  x 3 . NST x 3.  Sono 10/15 AGA, 915gm, nl AFI CL 3.5cm. Complete previa. NL 3gtt  A/P:  30w 0d IUP AGA by sono 10/15 Complete Previa- third bleed. Stable.  Gestational anemia improved s/p Feraheme Growth sono today IVF pregnancy BMZ  Complete Inpatient until delivery  Dulse Rutan J 09/16/2018 6:49 AM

## 2018-09-17 NOTE — Progress Notes (Addendum)
HD # 8 30wks 1d Previa- 3rd bleed  Pt notes occ mild cramping. Continued minimum brown dc with slight inc last night after sono, no BRB. Good FM. No LOF. NO pain or contractions noted  PE:  Vitals:   09/16/18 2251 09/17/18 0639  BP: 106/69 (!) 85/53  Pulse: (!) 105 88  Resp: 18 18  Temp: 98.4 F (36.9 C) (!) 97.5 F (36.4 C)  SpO2: 100% 97%   CBC    Component Value Date/Time   WBC 11.1 (H) 09/10/2018 0401   RBC 3.29 (L) 09/10/2018 0401   HGB 11.3 (L) 09/10/2018 0401   HCT 32.7 (L) 09/10/2018 0401   PLT 224 09/10/2018 0401   MCV 99.4 09/10/2018 0401   MCH 34.3 (H) 09/10/2018 0401   MCHC 34.6 09/10/2018 0401   RDW 13.9 09/10/2018 0401   LYMPHSABS 1.6 03/29/2017 0617   MONOABS 0.7 03/29/2017 0617   EOSABS 0.7 03/29/2017 0617   BASOSABS 0.0 03/29/2017 0617    Gen: well appearing, no distress NCAT Neck : supple with FROM Lgs: CTA CV: RRR Abd: gravid, NT No CVAT LE: no edema, neg cords.  GU: exam deferred, Neuro: nonfocal Skin: intact  Toco: Occ irritability. Rare contraction noted FH: 125s-130s, + accels, no decels, 5-25 BTBV Category 1 tracing  x 3 . NST x 3.  SONO 11/12 Fetal Evaluation  Num Of Fetuses:         1  Cardiac Activity:       Observed  Presentation:           Transverse, head to maternal right  Placenta:               Placenta Previa  Amniotic Fluid  AFI FV:      Within normal limits  AFI Sum(cm)     %Tile       Largest Pocket(cm)  15.32           54          5.29  RUQ(cm)       RLQ(cm)       LUQ(cm)        LLQ(cm)  1.34          3.85          4.84           5.29  Comment:    ? [place] (4.6 x 0.9 x 5.8 cm) ---------------------------------------------------------------------- Biometry  BPD:        79  mm     G. Age:  31w 5d         86  %    CI:        74.19   %    70 - 86                                                          FL/HC:      19.2   %    19.2 - 21.4  HC:      291.2  mm     G. Age:  32w 1d         75  %    HC/AC:      0.98         0.99 - 1.21  AC:  297.2  mm     G. Age:  33w 5d       > 97  %    FL/BPD:     70.9   %    71 - 87  FL:         56  mm     G. Age:  29w 4d         22  %    FL/AC:      18.8   %    20 - 24  Est. FW:    1911  gm      4 lb 3 oz     83  %  NL 3gtt  A/P:  30w 1d IUP AGA by sono 10/15 Complete Previa- third bleed. Stable.  Gestational anemia improved s/p Feraheme Growth sono done, results pending IVF pregnancy BMZ  Complete- will need rescue dose Inpatient until delivery  Donielle Radziewicz J 09/17/2018 6:40 AM

## 2018-09-18 LAB — TYPE AND SCREEN
ABO/RH(D): A POS
Antibody Screen: NEGATIVE

## 2018-09-18 NOTE — Progress Notes (Signed)
HD # 9 30wks 2d Previa- 3rd bleed  Pt notes occ mild cramping. Continued minimum brown dc with slight inc last night after sono, no BRB. Good FM. No LOF. NO pain or contractions noted  PE:  Vitals:   09/17/18 1944 09/18/18 0516  BP: 97/78 (!) 89/62  Pulse: 86 96  Resp: 18 16  Temp: 98.3 F (36.8 C) 98.2 F (36.8 C)  SpO2: 93% 99%   CBC    Component Value Date/Time   WBC 11.1 (H) 09/10/2018 0401   RBC 3.29 (L) 09/10/2018 0401   HGB 11.3 (L) 09/10/2018 0401   HCT 32.7 (L) 09/10/2018 0401   PLT 224 09/10/2018 0401   MCV 99.4 09/10/2018 0401   MCH 34.3 (H) 09/10/2018 0401   MCHC 34.6 09/10/2018 0401   RDW 13.9 09/10/2018 0401   LYMPHSABS 1.6 03/29/2017 0617   MONOABS 0.7 03/29/2017 0617   EOSABS 0.7 03/29/2017 0617   BASOSABS 0.0 03/29/2017 0617    Gen: well appearing, no distress NCAT Neck : supple with FROM Lgs: CTA CV: RRR Abd: gravid, NT No CVAT LE: no edema, neg cords.  GU: exam deferred, Neuro: nonfocal Skin: intact  Toco: Occ irritability. Rare contraction noted FH: 125s-130s, + accels, no decels, 5-25 BTBV Category 1 tracing  x 3 . NST x 3.  SONO 11/12 Fetal Evaluation  Num Of Fetuses:         1  Cardiac Activity:       Observed  Presentation:           Transverse, head to maternal right  Placenta:               Placenta Previa  Amniotic Fluid  AFI FV:      Within normal limits  AFI Sum(cm)     %Tile       Largest Pocket(cm)  15.32           54          5.29  RUQ(cm)       RLQ(cm)       LUQ(cm)        LLQ(cm)  1.34          3.85          4.84           5.29  Comment:    ? [place] (4.6 x 0.9 x 5.8 cm) ---------------------------------------------------------------------- Biometry  BPD:        79  mm     G. Age:  31w 5d         86  %    CI:        74.19   %    70 - 86                                                          FL/HC:      19.2   %    19.2 - 21.4  HC:      291.2  mm     G. Age:  32w 1d         75  %    HC/AC:      0.98        0.99 -  1.21  AC:      297.2  mm     G. Age:  33w 5d       > 97  %    FL/BPD:     70.9   %    71 - 87  FL:         56  mm     G. Age:  29w 4d         22  %    FL/AC:      18.8   %    20 - 24  Est. FW:    1911  gm      4 lb 3 oz     83  %  NL 3gtt  A/P:  30w 2d IUP AGA by sono 10/15 Complete Previa- third bleed. Stable.  Gestational anemia improved s/p Feraheme Growth sono done, results pending IVF pregnancy BMZ  Complete- will need rescue dose Inpatient until delivery  Sanaai Doane J 09/18/2018 6:42 AM

## 2018-09-18 NOTE — Progress Notes (Signed)
Initial Nutrition Assessment  DOCUMENTATION CODES:   Not applicable  INTERVENTION:  Regular diet, snacks TID of choice, double protein portions if requested  NUTRITION DIAGNOSIS:  Increased nutrient needs related to (pregnancy and fetal growth requirements) as evidenced by (30 weeks IUP).  GOAL:  Patient will meet greater than or equal to 90% of their needs  MONITOR:  Weight trends   REASON FOR ASSESSMENT:   Antenatal LOS   ASSESSMENT:  30 2/7 weeks IUP. 3rd adm for previa/bleeding. Will be here until delivers. Weight at initial prenatal visit 124 lbs, BMI 22.7. 19 lb weight gain to date.  Diet Order:   Diet Order            Diet regular Room service appropriate? Yes; Fluid consistency: Thin  Diet effective now             EDUCATION NEEDS:   No education needs have been identified at this time  Skin:  Skin Assessment: Reviewed RN Assessment  Height:   Ht Readings from Last 1 Encounters:  09/10/18 5\' 2"  (1.575 m)    Weight:   Wt Readings from Last 1 Encounters:  09/11/18 65.3 kg    Ideal Body Weight:   110 lbs  BMI:  Body mass index is 26.35 kg/m.  Estimated Nutritional Needs:   Kcal:  1700-1900  Protein:  75-85 g  Fluid:  2 L    Weyman Rodney M.Fredderick Severance LDN Neonatal Nutrition Support Specialist/RD III Pager [phone]      Phone [phone]

## 2018-09-19 NOTE — Progress Notes (Signed)
HD # 10 30wks 3d Previa- 3rd bleed  Pt notes occ mild cramping. Continued minimum brown dc with slight inc last night after sono, no BRB. Good FM. No LOF. NO pain or contractions noted  PE:  Vitals:   09/18/18 2034 09/19/18 0614  BP:  (!) 90/57  Pulse: 77 78  Resp: 16 18  Temp: 97.7 F (36.5 C) 98 F (36.7 C)  SpO2: 99% 98%   CBC    Component Value Date/Time   WBC 11.1 (H) 09/10/2018 0401   RBC 3.29 (L) 09/10/2018 0401   HGB 11.3 (L) 09/10/2018 0401   HCT 32.7 (L) 09/10/2018 0401   PLT 224 09/10/2018 0401   MCV 99.4 09/10/2018 0401   MCH 34.3 (H) 09/10/2018 0401   MCHC 34.6 09/10/2018 0401   RDW 13.9 09/10/2018 0401   LYMPHSABS 1.6 03/29/2017 0617   MONOABS 0.7 03/29/2017 0617   EOSABS 0.7 03/29/2017 0617   BASOSABS 0.0 03/29/2017 0617    Gen: well appearing, no distress NCAT Neck : supple with FROM Lgs: CTA CV: RRR Abd: gravid, NT No CVAT LE: no edema, neg cords.  GU: exam deferred, Neuro: nonfocal Skin: intact  Toco: Occ irritability. Rare contraction noted FH: 125s-130s, + accels, no decels, 5-25 BTBV Category 1 tracing  x 3 . NST x 3.  SONO 11/12 Fetal Evaluation  Num Of Fetuses:         1  Cardiac Activity:       Observed  Presentation:           Transverse, head to maternal right  Placenta:               Placenta Previa  Amniotic Fluid  AFI FV:      Within normal limits  AFI Sum(cm)     %Tile       Largest Pocket(cm)  15.32           54          5.29  RUQ(cm)       RLQ(cm)       LUQ(cm)        LLQ(cm)  1.34          3.85          4.84           5.29  Comment:    ? [name] (4.6 x 0.9 x 5.8 cm) ---------------------------------------------------------------------- Biometry  BPD:        79  mm     G. Age:  31w 5d         86  %    CI:        74.19   %    70 - 86                                                          FL/HC:      19.2   %    19.2 - 21.4  HC:      291.2  mm     G. Age:  32w 1d         75  %    HC/AC:      0.98        0.99 - 1.21  AC:      297.2  mm  G. Age:  33w 5d       > 97  %    FL/BPD:     70.9   %    71 - 87  FL:         56  mm     G. Age:  29w 4d         22  %    FL/AC:      18.8   %    20 - 24  Est. FW:    1911  gm      4 lb 3 oz     83  %  NL 3gtt  A/P:  30w 3d IUP AGA by sono 10/15 Complete Previa- third bleed. Stable. 6x5cm marginal abruptio noted on sono. Gestational anemia improved s/p Feraheme Growth sono done, results pending IVF pregnancy BMZ  Complete- will need rescue dose Inpatient until delivery  Jacqueline [name] J 09/19/2018 8:47 AM

## 2018-09-20 NOTE — Progress Notes (Signed)
HD #11 30 4/7 weeks placenta previa  S: no complaint. Active fetus. No ctx. Scant brown d/c  O: BP 97/67 (BP Location: Left Arm)   Pulse 89   Temp 98.6 F (37 C) (Oral)   Resp 18   Ht 5\' 2"  (1.575 m)   Wt 65.3 kg   LMP 12/17/2017 Comment: spotting  SpO2 98%   BMI 26.35 kg/m  Lungs clear to A Cor RRR abd soft gravid breech RUQ Pelvic deferred Ext no edema or calf tenderness  Tracing: baseline 135 (+) good variability (+) 10 x 10 accels No ctx  IMP: placenta previa Marginal abruption IVF pregnancy IUP@ 30 4/7 wks malpresentation P) cont inpt

## 2018-09-21 LAB — TYPE AND SCREEN
ABO/RH(D): A POS
Antibody Screen: NEGATIVE

## 2018-09-21 MED ORDER — NIFEDIPINE 10 MG PO CAPS
10.0000 mg | ORAL_CAPSULE | Freq: Once | ORAL | Status: AC
  Administered 2018-09-21: 10 mg via ORAL
  Filled 2018-09-21: qty 1

## 2018-09-21 MED ORDER — NIFEDIPINE 10 MG PO CAPS
10.0000 mg | ORAL_CAPSULE | Freq: Four times a day (QID) | ORAL | Status: DC | PRN
  Administered 2018-09-21 – 2018-09-22 (×3): 10 mg via ORAL
  Filled 2018-09-21 (×3): qty 1

## 2018-09-21 MED ORDER — BETAMETHASONE SOD PHOS & ACET 6 (3-3) MG/ML IJ SUSP
12.0000 mg | Freq: Once | INTRAMUSCULAR | Status: AC
  Administered 2018-09-21: 12 mg via INTRAMUSCULAR
  Filled 2018-09-21: qty 2

## 2018-09-21 NOTE — Progress Notes (Signed)
Dr. Benjie Karvonen notified pt bleed into her underwear, bright red, small amt on fetal monitor pt c/o cramping no uc on monitor.  Orders rec will continue to monitor.

## 2018-09-21 NOTE — Progress Notes (Signed)
Dr. Benjie Karvonen notified of patients contraction pattern. Ordered to give an additional dose of procardia 10 mg.

## 2018-09-21 NOTE — Progress Notes (Signed)
Pt called out said she was bleeding, pt sitting on toilet with bight red bleeding in underwear while sitting on the toilet pt passed a clot the size of a quarter in toilet.   Pt cleaned up back to bed placed on EFM.

## 2018-09-21 NOTE — Progress Notes (Signed)
HD #12 30. 5/7 weeks placenta previa  S: Bleeding and slight cramping last night. Cramping resolved with Procardia 10mg  single dose and bleeding is now dark only.   O: BP 99/68 (BP Location: Right Arm)   Pulse 96   Temp 98.6 F (37 C) (Oral)   Resp 16   Ht 5\' 2"  (1.575 m)   Wt 65.3 kg   LMP 12/17/2017 Comment: spotting  SpO2 99%   BMI 26.35 kg/m  abd soft gravid Pelvic pad with dark blood, no active bleeding noted  Ext no edema or calf tenderness  Tracing: baseline 135 (+) good variability (+) 10 x 10 accels No ctx  IMP: 30.5/7 wks, placenta previa with another episode of bleeding last night and cramping. Improved with procardia single dose.  Marginal abruption, add sono tomorrow  IVF pregnancy BTMZ - rescue dose today due to new bleeding episode. She agrees.   Baby shower on the 1st floor of hospital today, will let her go on wheelchair if no new bleeding episodes.    V.Jeanice Dempsey, MD

## 2018-09-22 ENCOUNTER — Inpatient Hospital Stay (HOSPITAL_BASED_OUTPATIENT_CLINIC_OR_DEPARTMENT_OTHER): Payer: BLUE CROSS/BLUE SHIELD

## 2018-09-22 DIAGNOSIS — Z3A30 30 weeks gestation of pregnancy: Secondary | ICD-10-CM

## 2018-09-22 DIAGNOSIS — O4413 Placenta previa with hemorrhage, third trimester: Secondary | ICD-10-CM

## 2018-09-22 DIAGNOSIS — O4693 Antepartum hemorrhage, unspecified, third trimester: Secondary | ICD-10-CM

## 2018-09-22 LAB — COMPREHENSIVE METABOLIC PANEL
ALT: 19 U/L (ref 0–44)
AST: 20 U/L (ref 15–41)
Albumin: 2.8 g/dL — ABNORMAL LOW (ref 3.5–5.0)
Alkaline Phosphatase: 62 U/L (ref 38–126)
Anion gap: 9 (ref 5–15)
BUN: <5 mg/dL — ABNORMAL LOW (ref 6–20)
CO2: 25 mmol/L (ref 22–32)
Calcium: 7.3 mg/dL — ABNORMAL LOW (ref 8.9–10.3)
Chloride: 103 mmol/L (ref 98–111)
Creatinine, Ser: 0.64 mg/dL (ref 0.44–1.00)
GFR calc Af Amer: >60 mL/min (ref >60)
GFR calc non Af Amer: >60 mL/min (ref >60)
Glucose, Bld: 125 mg/dL — ABNORMAL HIGH (ref 70–99)
Potassium: 3.5 mmol/L (ref 3.5–5.1)
Sodium: 137 mmol/L (ref 135–145)
Total Bilirubin: 0.7 mg/dL (ref 0.3–1.2)
Total Protein: 5.6 g/dL — ABNORMAL LOW (ref 6.5–8.1)

## 2018-09-22 LAB — CBC
HCT: 32.6 % — ABNORMAL LOW (ref 36.0–46.0)
Hemoglobin: 10.9 g/dL — ABNORMAL LOW (ref 12.0–15.0)
MCH: 33 pg (ref 26.0–34.0)
MCHC: 33.4 g/dL (ref 30.0–36.0)
MCV: 98.8 fL (ref 80.0–100.0)
Platelets: 244 10*3/uL (ref 150–400)
RBC: 3.3 MIL/uL — ABNORMAL LOW (ref 3.87–5.11)
RDW: 13.7 % (ref 11.5–15.5)
WBC: 16.2 10*3/uL — ABNORMAL HIGH (ref 4.0–10.5)
nRBC: 0 % (ref 0.0–0.2)

## 2018-09-22 LAB — MAGNESIUM: Magnesium: 4.4 mg/dL — ABNORMAL HIGH (ref 1.7–2.4)

## 2018-09-22 LAB — PHOSPHORUS: Phosphorus: 3 mg/dL (ref 2.5–4.6)

## 2018-09-22 MED ORDER — MAGNESIUM SULFATE BOLUS VIA INFUSION
4.0000 g | Freq: Once | INTRAVENOUS | Status: AC
  Administered 2018-09-22: 4 g via INTRAVENOUS
  Filled 2018-09-22: qty 500

## 2018-09-22 MED ORDER — TERBUTALINE SULFATE 1 MG/ML IJ SOLN
INTRAMUSCULAR | Status: AC
  Administered 2018-09-22: 08:00:00
  Filled 2018-09-22: qty 1

## 2018-09-22 MED ORDER — SODIUM CHLORIDE 0.9 % IV SOLN
500.0000 mg | INTRAVENOUS | Status: DC
  Administered 2018-09-22: 500 mg via INTRAVENOUS
  Filled 2018-09-22: qty 500

## 2018-09-22 MED ORDER — AZITHROMYCIN 250 MG PO TABS: 500.0000 mg | ORAL_TABLET | Freq: Every day | ORAL | Status: DC

## 2018-09-22 MED ORDER — TERBUTALINE SULFATE 1 MG/ML IJ SOLN: 0.2500 mg | Freq: Once | INTRAMUSCULAR | Status: DC

## 2018-09-22 MED ORDER — AMOXICILLIN 500 MG PO CAPS
500.0000 mg | ORAL_CAPSULE | Freq: Three times a day (TID) | ORAL | Status: AC
  Administered 2018-09-24 – 2018-09-29 (×15): 500 mg via ORAL
  Filled 2018-09-22 (×15): qty 1

## 2018-09-22 MED ORDER — SODIUM CHLORIDE 0.9 % IV SOLN: 510.0000 mg | Freq: Once | INTRAVENOUS | Status: DC

## 2018-09-22 MED ORDER — SOD CITRATE-CITRIC ACID 500-334 MG/5ML PO SOLN
ORAL | Status: AC
  Filled 2018-09-22: qty 15

## 2018-09-22 MED ORDER — SODIUM CHLORIDE 0.9 % IV SOLN
2.0000 g | Freq: Four times a day (QID) | INTRAVENOUS | Status: AC
  Administered 2018-09-22 – 2018-09-24 (×8): 2 g via INTRAVENOUS
  Filled 2018-09-22 (×8): qty 2

## 2018-09-22 MED ORDER — NIFEDIPINE 10 MG PO CAPS
10.0000 mg | ORAL_CAPSULE | Freq: Four times a day (QID) | ORAL | Status: DC
  Administered 2018-09-22 – 2018-09-26 (×15): 10 mg via ORAL
  Filled 2018-09-22 (×15): qty 1

## 2018-09-22 MED ORDER — MAGNESIUM SULFATE 40 G IN LACTATED RINGERS - SIMPLE
2.0000 g/h | INTRAVENOUS | Status: AC
  Filled 2018-09-22: qty 500

## 2018-09-22 MED ORDER — SODIUM CHLORIDE 0.9 % IV SOLN
510.0000 mg | Freq: Once | INTRAVENOUS | Status: AC
  Administered 2018-09-22: 510 mg via INTRAVENOUS
  Filled 2018-09-22: qty 17

## 2018-09-22 MED ORDER — LACTATED RINGERS IV SOLN
INTRAVENOUS | Status: DC
  Administered 2018-09-22 (×2): via INTRAVENOUS

## 2018-09-22 NOTE — Progress Notes (Signed)
Pt mother came to desk to report pt up to toilet and bleeding. Charge nurse entered room to assess. Pt back to bed and MD notified. IV fluids initiated per order. Pt placed on fetal monitor.

## 2018-09-22 NOTE — Progress Notes (Addendum)
Patient ID: Jacqueline [name], female   DOB: 05-09-1991, 27 y.o.   MRN: [id number] Call from RN at 7.37 am with patient bleeding. Saw her soon after that.  The pad she had on overnight was stained with red fresh blood. New pad she had on that I saw was moderately saturated and had dollar coin size clot on it. She passed a quarter size clot in the commode per her mother prior to this pad. VS nl, pulse 100s, BP 110/70s. IV going with LR, 2nd IV attempt being made when I was there failed. Stat CBC sent.  Pt appears pink, A&O  FHT 140s + accels no decels, mod variability- reactive/ Catergory I Toco- overnight UCs noted, received last Procardia 10 mg at 9 pm (after scheduled Procardia at 6 pm). I didn't get a call about UC sfter that since patient was asymptomatic. She didn't feel any UCs before this bleeding.   Speculum exam- 5 cm size clot removed. Vagina cleared of collected blood. Cervix closed and minimal bleeding noted from os.  Overall EBL about 130-150 cc   A/P: 30.6 weeks Placenta Previa with bleeding. - this is her worse bleeding.          Uterine irritability.          Prematurity  Terbutaline 0.25 mg [place] given and Procardia 10 mg PO given. NPO. IV LR.  Magnesium sulphate for neuroprophylaxis since <32 wks for 12 hrs, then continue Procardia 10mg  q 6 hrs.  Rescue BTMX 11/16 AM   CBC back CBC Latest Ref Rng & Units 09/22/2018 09/10/2018 08/19/2018  WBC 4.0 - 10.5 K/uL 16.2(H) 11.1(H) 13.9(H)  Hemoglobin 12.0 - 15.0 g/dL 10.9(L) 11.3(L) 11.3(L)  Hematocrit 36.0 - 46.0 % 32.6(L) 32.7(L) 32.9(L)  Platelets 150 - 400 K/uL 244 224 294   Adding IV Feraheme after Magnesium.  Anesthesia, ObOR team informed of possible C-section if recurrent moderate/heavy bleeding.   Informed written consent from pt for C-section. Husband arrived soon after I completed exam/ assessment. Her mother in room as well, all voice understanding.  Risks/complications of surgery reviewed incl infection, bleeding, damage to  internal organs including bladder, bowels, ureters, blood vessels, other risks from anesthesia, VTE and delayed complications of any surgery, complications in future surgery reviewed. Also discussed neonatal complications incl difficult delivery, laceration, vacuum assistance, TTN etc. Pt understands and agrees, all concerns addressed.   Will inform NICU.   Will repeat speculum exam and assess bleeding at regular intervals.  V.Marche Hottenstein, MD

## 2018-09-22 NOTE — Progress Notes (Signed)
Patient continuing a contraction pattern every 6-8 min. Patient states feeling a contraction every once in a while but they aren't bad enough to wake her up. Will continue to monitor patient closely.

## 2018-09-22 NOTE — Progress Notes (Signed)
Patient ID: Jacqueline [name], female   DOB: 1991/02/11, 27 y.o.   MRN: [id number] Pt assessed again. Heavier bleeding/ clots stopped but constant fluid per vagina now more watery wet and blood tinged per pt and RN.   Sono- -  Transverse lie, posterior previa persists AFI 15 cm  Small subchorionic hemorrhage noted in the anterior part of the placenta, measurments are 5.3x4.2x1.1 cm Fetal breathing and movements are well. CL 5 cm, closed   BP 108/65   Pulse (!) 118   Temp 98.6 F (37 C)   Resp 18   Ht 5\' 2"  (1.575 m)   Wt 65.3 kg   LMP 12/17/2017 Comment: spotting  SpO2 98%   BMI 26.35 kg/m  A&O and feels magnesium related warmth but tolerating well, no CP/SOB/dizziness/ weakness Abdo soft gravid uterus  Spec exam- initially a large pool of clear/rice water fluid noted in the vagina. After swabbing it out, fluidy thin bloody fluid noted. No active bleeding   A/P: Previa with bleeding. Possible PPROM.  Nl AFI but exam appears to be likely PPROM, cant check for ferning or amniosure due to bleeding.  Will repeat sono tomorrow for AFI if stable today and not delivered Adding Amp/ Azithromycin for latency with very likely PPROM  V.Wymon Swaney, MD

## 2018-09-22 NOTE — Progress Notes (Signed)
Sleeping comfortably, can still feel chest pressure. No SOB. Good FM, no contractions/ cramping. Feels flushed and heart racing with procardia. Quarter sized staining on pad when up to void.  Vitals:   09/22/18 1623 09/22/18 1700 09/22/18 1800 09/22/18 1939  BP: 133/76   102/69  Pulse: (!) 109   (!) 108  Resp: (!) 99 18 16 20   Temp:    98.4 F (36.9 C)  TempSrc:    Oral  SpO2:    97%  Weight:      Height:       Gen: resting in bed, comfortable CV: tachycardic, regular rhythm Pulm CTAB Abd soft, gravid GU: quarter sized stain on pad Toco: irritibiity, occ ctx NST: reactive  A/P: placenta previa with new bleeding this am, probable PPROM now with new c/o chest pain. - chest pain, improving, able to sleep comfortably, per nurse CP comes with contraction, which are now intermittent/ rare. Probably anxiety component. 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 - previa, bleed, now stable, cont close observation - PPROM, suspected, u/s in am. Latency antibiotics. - reactive fetal testing, s/p 12 hrs Mag and rescue dose BMZ.   Ala Dach 09/22/2018 10:32 PM

## 2018-09-23 ENCOUNTER — Inpatient Hospital Stay (HOSPITAL_BASED_OUTPATIENT_CLINIC_OR_DEPARTMENT_OTHER): Payer: BLUE CROSS/BLUE SHIELD

## 2018-09-23 DIAGNOSIS — O4290 Premature rupture of membranes, unspecified as to length of time between rupture and onset of labor, unspecified weeks of gestation: Secondary | ICD-10-CM

## 2018-09-23 DIAGNOSIS — O4693 Antepartum hemorrhage, unspecified, third trimester: Secondary | ICD-10-CM

## 2018-09-23 DIAGNOSIS — Z3A31 31 weeks gestation of pregnancy: Secondary | ICD-10-CM

## 2018-09-23 DIAGNOSIS — O4413 Placenta previa with hemorrhage, third trimester: Secondary | ICD-10-CM

## 2018-09-23 MED ORDER — SODIUM CHLORIDE 0.9 % IV SOLN
500.0000 mg | Freq: Once | INTRAVENOUS | Status: AC
  Administered 2018-09-23: 500 mg via INTRAVENOUS
  Filled 2018-09-23: qty 500

## 2018-09-23 NOTE — Progress Notes (Signed)
Pt off the monitor after reassuring FHR, pt instructed to call RN if decreased FM, UCs more frequent or stronger, or if VB returns. Pt verbalizes understanding.

## 2018-09-23 NOTE — Progress Notes (Signed)
HD # 14 31wks 0d Previa- 3rd bleed  Pt notes occ mild cramping. Continued minimum BRB dc with slight inc since am , no heavy bleeding.  Good FM. No LOF. NO pain or contractions noted  PE:  Vitals:   09/23/18 0400 09/23/18 0645  BP:  (!) 105/52  Pulse:  (!) 103  Resp:    Temp:    SpO2: 96% 96%   CBC    Component Value Date/Time   WBC 16.2 (H) 09/22/2018 0756   RBC 3.30 (L) 09/22/2018 0756   HGB 10.9 (L) 09/22/2018 0756   HCT 32.6 (L) 09/22/2018 0756   PLT 244 09/22/2018 0756   MCV 98.8 09/22/2018 0756   MCH 33.0 09/22/2018 0756   MCHC 33.4 09/22/2018 0756   RDW 13.7 09/22/2018 0756   LYMPHSABS 1.6 03/29/2017 0617   MONOABS 0.7 03/29/2017 0617   EOSABS 0.7 03/29/2017 0617   BASOSABS 0.0 03/29/2017 0617    Gen: well appearing, no distress NCAT Neck : supple with FROM Lgs: CTA CV: RRR Abd: gravid, NT No CVAT LE: no edema, neg cords.  GU: exam deferred, Neuro: nonfocal Skin: intact  Toco: Occ irritability. Rare contraction noted FH: 125s-130s, + accels, no decels, 5-25 BTBV Category 1 tracing  x 3 . NST x 3.  SONO 11/12 Fetal Evaluation  Num Of Fetuses:         1  Cardiac Activity:       Observed  Presentation:           Transverse, head to maternal right  Placenta:               Placenta Previa  Amniotic Fluid  AFI FV:      Within normal limits  AFI Sum(cm)     %Tile       Largest Pocket(cm)  15.32           54          5.29  RUQ(cm)       RLQ(cm)       LUQ(cm)        LLQ(cm)  1.34          3.85          4.84           5.29  Comment:    ? [place] (4.6 x 0.9 x 5.8 cm) ---------------------------------------------------------------------- Biometry  BPD:        79  mm     G. Age:  31w 5d         86  %    CI:        74.19   %    70 - 86                                                          FL/HC:      19.2   %    19.2 - 21.4  HC:      291.2  mm     G. Age:  32w 1d         75  %    HC/AC:      0.98        0.99 - 1.21  AC:      297.2  mm     G.  Age:  33w 5d        > 97  %    FL/BPD:     70.9   %    71 - 87  FL:         56  mm     G. Age:  29w 4d         22  %    FL/AC:      18.8   %    20 - 24  Est. FW:    1911  gm      4 lb 3 oz     83  %  NL 3gtt  A/P:  31w 0d IUP AGA by sono 10/15 Complete Previa- third bleed. Stable. 6x5cm marginal abruptio noted on sono. ?PPROM- not definitive but cannot rule out. AFI stable on recent sono AFI 11 today.  Gestational anemia improved s/p Feraheme IVF pregnancy Chest pain of ? Etx.- will do cardio consult prn.  BMZ  Complete-  rescue dose given 11/16 in lieu of possible PPROM and new onset bleeding S/P MgSO4 neuroprotection x 12h Sono today to recheck AFI.- stable Inpatient until delivery  Tal Kempker J 09/23/2018 7:53 AM

## 2018-09-24 NOTE — Progress Notes (Addendum)
Patient ID: Jacqueline [name], female   DOB: February 13, 1991, 27 y.o.   MRN: [id number] No painful contractions noted Good FM. No bleeding. NO LOF BP 100/64 (BP Location: Right Arm)   Pulse 70   Temp 98.4 F (36.9 C) (Oral)   Resp 18   Ht 5\' 2"  (1.575 m)   Wt 65.3 kg   LMP 12/17/2017 Comment: spotting  SpO2 100%   BMI 26.35 kg/m   Continue intermittent monitoring

## 2018-09-24 NOTE — Progress Notes (Signed)
HD # 15 31wks 0d Previa- 3rd bleed  Pt notes occ mild cramping. Continued minimum BRB dc , no heavy bleeding.  Good FM. No LOF. NO pain or contractions noted  PE:  Vitals:   09/23/18 1950 09/24/18 0554  BP: 99/69 98/61  Pulse: (!) 106 (!) 102  Resp: 17 16  Temp: 98.3 F (36.8 C) 98.4 F (36.9 C)  SpO2: 98% 94%   CBC    Component Value Date/Time   WBC 16.2 (H) 09/22/2018 0756   RBC 3.30 (L) 09/22/2018 0756   HGB 10.9 (L) 09/22/2018 0756   HCT 32.6 (L) 09/22/2018 0756   PLT 244 09/22/2018 0756   MCV 98.8 09/22/2018 0756   MCH 33.0 09/22/2018 0756   MCHC 33.4 09/22/2018 0756   RDW 13.7 09/22/2018 0756   LYMPHSABS 1.6 03/29/2017 0617   MONOABS 0.7 03/29/2017 0617   EOSABS 0.7 03/29/2017 0617   BASOSABS 0.0 03/29/2017 0617    Gen: well appearing, no distress NCAT Neck : supple with FROM Lgs: CTA CV: RRR Abd: gravid, NT No CVAT LE: no edema, neg cords.  GU: exam deferred, Neuro: nonfocal Skin: intact  Toco: Occ irritability. Rare contraction noted FH: 125s-130s, + accels, no decels, 5-25 BTBV Category 1 tracing  x 3 . NST x 3.  SONO 11/12 Fetal Evaluation  Num Of Fetuses:         1  Cardiac Activity:       Observed  Presentation:           Transverse, head to maternal right  Placenta:               Placenta Previa  Amniotic Fluid  AFI FV:      Within normal limits  AFI Sum(cm)     %Tile       Largest Pocket(cm)  15.32           54          5.29  RUQ(cm)       RLQ(cm)       LUQ(cm)        LLQ(cm)  1.34          3.85          4.84           5.29  Comment:    ? [place] (4.6 x 0.9 x 5.8 cm) ---------------------------------------------------------------------- Biometry  BPD:        79  mm     G. Age:  31w 5d         86  %    CI:        74.19   %    70 - 86                                                          FL/HC:      19.2   %    19.2 - 21.4  HC:      291.2  mm     G. Age:  32w 1d         75  %    HC/AC:      0.98        0.99 - 1.21  AC:      297.2  mm  G. Age:  33w 5d       > 97  %    FL/BPD:     70.9   %    71 - 87  FL:         56  mm     G. Age:  29w 4d         22  %    FL/AC:      18.8   %    20 - 24  Est. FW:    1911  gm      4 lb 3 oz     83  %  NL 3gtt  A/P:  31w 1d IUP AGA by sono 10/15 Complete Previa- third bleed. Stable. 6x5cm marginal abruptio noted on sono now smaller. ?PPROM- not definitive but cannot rule out. AFI stable on recent sono AFI 11 today.  Gestational anemia improved s/p Feraheme IVF pregnancy Chest pain of ? Etx.- will do cardio consult prn.  BMZ  Complete-  rescue dose given 11/16 in lieu of possible PPROM and new onset bleeding S/P MgSO4 neuroprotection x 12h Sono today to recheck AFI.- stable Inpatient until delivery  Jacqueline [name] J 09/24/2018 6:48 AM

## 2018-09-25 LAB — TYPE AND SCREEN
ABO/RH(D): A POS
Antibody Screen: NEGATIVE

## 2018-09-25 NOTE — Progress Notes (Signed)
HD # 16 31wks 2d Previa- 3rd bleed  Pt notes occ mild cramping. Continued minimum BRB dc , no heavy bleeding.  Good FM. No LOF. NO pain or contractions noted. Denies gush of fluid  PE:  Vitals:   09/24/18 1951 09/25/18 0643  BP: 106/68 (!) 94/55  Pulse: 90 100  Resp: 16 16  Temp: 97.6 F (36.4 C) 97.9 F (36.6 C)  SpO2: 97% 96%   CBC    Component Value Date/Time   WBC 16.2 (H) 09/22/2018 0756   RBC 3.30 (L) 09/22/2018 0756   HGB 10.9 (L) 09/22/2018 0756   HCT 32.6 (L) 09/22/2018 0756   PLT 244 09/22/2018 0756   MCV 98.8 09/22/2018 0756   MCH 33.0 09/22/2018 0756   MCHC 33.4 09/22/2018 0756   RDW 13.7 09/22/2018 0756   LYMPHSABS 1.6 03/29/2017 0617   MONOABS 0.7 03/29/2017 0617   EOSABS 0.7 03/29/2017 0617   BASOSABS 0.0 03/29/2017 0617    Gen: well appearing, no distress NCAT Neck : supple with FROM Lgs: CTA CV: RRR Abd: gravid, NT No CVAT LE: no edema, neg cords.  GU: exam deferred, Neuro: nonfocal Skin: intact  Toco: Occ irritability. Rare contraction noted FH: 125s-130s, + accels, no decels, 5-25 BTBV Category 1 tracing  x 3 . NST x 3.  SONO 11/12 Fetal Evaluation  Num Of Fetuses:         1  Cardiac Activity:       Observed  Presentation:           Transverse, head to maternal right  Placenta:               Placenta Previa  Amniotic Fluid  AFI FV:      Within normal limits  AFI Sum(cm)     %Tile       Largest Pocket(cm)  15.32           54          5.29  RUQ(cm)       RLQ(cm)       LUQ(cm)        LLQ(cm)  1.34          3.85          4.84           5.29  Comment:    ? [place] (4.6 x 0.9 x 5.8 cm) ---------------------------------------------------------------------- Biometry  BPD:        79  mm     G. Age:  31w 5d         86  %    CI:        74.19   %    70 - 86                                                          FL/HC:      19.2   %    19.2 - 21.4  HC:      291.2  mm     G. Age:  32w 1d         75  %    HC/AC:      0.98        0.99 - 1.21  AC:      297.2  mm  G. Age:  33w 5d       > 97  %    FL/BPD:     70.9   %    71 - 87  FL:         56  mm     G. Age:  29w 4d         22  %    FL/AC:      18.8   %    20 - 24  Est. FW:    1911  gm      4 lb 3 oz     83  %  NL 3gtt  A/P:  [redacted]w[redacted]d IUP AGA by sono 10/15 Complete Previa- third bleed. Stable. 6x5cm marginal abruptio noted on sono now smaller. ?PPROM- not definitive but cannot rule out. AFI stable on recent sono AFI 11 today.  Gestational anemia improved s/p Feraheme IVF pregnancy Chest pain of ? Etx. resolved- will do cardio consult prn.  BMZ  Complete-  rescue dose given 11/16 in lieu of possible PPROM and new onset bleeding S/P MgSO4 neuroprotection x 12h Sono tomorrow to recheck AFI. Monitor s/s chorio Inpatient until delivery  [name] J 09/25/2018 6:46 AM

## 2018-09-26 ENCOUNTER — Inpatient Hospital Stay (HOSPITAL_BASED_OUTPATIENT_CLINIC_OR_DEPARTMENT_OTHER): Payer: BLUE CROSS/BLUE SHIELD

## 2018-09-26 DIAGNOSIS — O4693 Antepartum hemorrhage, unspecified, third trimester: Secondary | ICD-10-CM

## 2018-09-26 DIAGNOSIS — O09813 Supervision of pregnancy resulting from assisted reproductive technology, third trimester: Secondary | ICD-10-CM

## 2018-09-26 DIAGNOSIS — O4413 Placenta previa with hemorrhage, third trimester: Secondary | ICD-10-CM

## 2018-09-26 DIAGNOSIS — Z3A31 31 weeks gestation of pregnancy: Secondary | ICD-10-CM

## 2018-09-26 DIAGNOSIS — O4290 Premature rupture of membranes, unspecified as to length of time between rupture and onset of labor, unspecified weeks of gestation: Secondary | ICD-10-CM

## 2018-09-26 MED ORDER — NIFEDIPINE 10 MG PO CAPS
10.0000 mg | ORAL_CAPSULE | Freq: Four times a day (QID) | ORAL | Status: DC | PRN
Start: 2018-09-26 — End: 2018-09-29
  Administered 2018-09-27 – 2018-09-29 (×8): 10 mg via ORAL
  Filled 2018-09-26 (×10): qty 1

## 2018-09-26 NOTE — Progress Notes (Signed)
   09/26/18 1041  Uterine Activity  Mode Toco  Contraction Frequency (min) 6 UCs noted with UI  Contraction Duration (sec) 50-60  Contraction Quality Mild  Resting Tone Palpated Relaxed  report given to Dr Ronita Hipps on above assessment of EFM & that Procardia was given.  No additional orders rec'd.

## 2018-09-26 NOTE — Progress Notes (Signed)
HD # 17 31wks 3d Previa- 3rd bleed  Pt notes occ mild cramping. Continued minimum BRB dc , no heavy bleeding.  String serosanguinous dc.  Good FM. No LOF. NO pain or contractions noted. Denies gush of fluid  PE:  Vitals:   09/25/18 1952 09/26/18 0502  BP: 100/68 (!) 90/53  Pulse: 84 84  Resp: 16 16  Temp: 98.3 F (36.8 C) 97.7 F (36.5 C)  SpO2: 96% 97%   CBC    Component Value Date/Time   WBC 16.2 (H) 09/22/2018 0756   RBC 3.30 (L) 09/22/2018 0756   HGB 10.9 (L) 09/22/2018 0756   HCT 32.6 (L) 09/22/2018 0756   PLT 244 09/22/2018 0756   MCV 98.8 09/22/2018 0756   MCH 33.0 09/22/2018 0756   MCHC 33.4 09/22/2018 0756   RDW 13.7 09/22/2018 0756   LYMPHSABS 1.6 03/29/2017 0617   MONOABS 0.7 03/29/2017 0617   EOSABS 0.7 03/29/2017 0617   BASOSABS 0.0 03/29/2017 0617    Gen: well appearing, no distress NCAT Neck : supple with FROM Lgs: CTA CV: RRR Abd: gravid, NT No CVAT LE: no edema, neg cords.  GU: exam deferred, Neuro: nonfocal Skin: intact  Toco: Occ irritability. Rare contraction noted FH: 125s-130s, + accels, no decels, 5-25 BTBV Category 1 tracing  x 3 . NST x 3.  SONO 11/12 Fetal Evaluation  Num Of Fetuses:         1  Cardiac Activity:       Observed  Presentation:           Transverse, head to maternal right  Placenta:               Placenta Previa  Amniotic Fluid  AFI FV:      Within normal limits  AFI Sum(cm)     %Tile       Largest Pocket(cm)  15.32           54          5.29  RUQ(cm)       RLQ(cm)       LUQ(cm)        LLQ(cm)  1.34          3.85          4.84           5.29  Comment:    ? [place] (4.6 x 0.9 x 5.8 cm) ---------------------------------------------------------------------- Biometry  BPD:        79  mm     G. Age:  31w 5d         86  %    CI:        74.19   %    70 - 86                                                          FL/HC:      19.2   %    19.2 - 21.4  HC:      291.2  mm     G. Age:  32w 1d         75  %    HC/AC:       0.98        0.99 - 1.21  AC:  297.2  mm     G. Age:  33w 5d       > 97  %    FL/BPD:     70.9   %    71 - 87  FL:         56  mm     G. Age:  29w 4d         22  %    FL/AC:      18.8   %    20 - 24  Est. FW:    1911  gm      4 lb 3 oz     83  %  NL 3gtt  BPP today 8/8 AFI 13  A/P:  [redacted]w[redacted]d IUP AGA by sono 11/12 Complete Previa- third bleed. Stable. 6x5cm marginal abruptio noted on sono now smaller. ?PPROM- unlikely based on clinical sxs and stable AFI Gestational anemia improved s/p Feraheme IVF pregnancy Chest pain of ? Etx. resolved- will do cardio consult prn.  BMZ  Complete-  rescue dose given 11/16  S/P MgSO4 neuroprotection  Monitor s/s chorio Inpatient until delivery  Jacqueline [name] J 09/26/2018 6:49 AM

## 2018-09-27 NOTE — Progress Notes (Addendum)
0700: Pt called out for RN, stating she was bleeding. Small trickle of bright red blood noted on pad and bright red smear was noted on peripad. Pt stated she was feeling crampy. Placed pt on monitor to evaluate. 1225: 10 mg procardia given. 07:57 update given to Dr. Ronita Hipps. No new orders at this time. Will continue to monitor.

## 2018-09-27 NOTE — Progress Notes (Signed)
Patient ID: Jacqueline [name], female   DOB: 11-11-1990, 27 y.o.   MRN: [id number] Intermittent spotting today No LOF. Good FM. NO cramping. BP 103/68 (BP Location: Right Arm)   Pulse 90   Temp 98.3 F (36.8 C) (Oral)   Resp 16   Ht 5\' 2"  (1.575 m)   Wt 65.3 kg   LMP 12/17/2017 Comment: spotting  SpO2 98%   BMI 26.35 kg/m   FHR 140s, Occ UI, rare contractions Continue present care

## 2018-09-27 NOTE — Progress Notes (Signed)
HD # 18 31wks 4d Previa- 3rd bleed  Pt notes occ mild cramping. Continued minimum BRB dc , no heavy bleeding.  String serosanguinous dc increased this am. Crampy over night. Good FM. No LOF. NO pain or contractions noted. Denies gush of fluid  PE:  Vitals:   09/26/18 1924 09/27/18 0428  BP: 109/75 (!) 81/53  Pulse: 81 92  Resp: 16 15  Temp: 98.4 F (36.9 C) 97.9 F (36.6 C)  SpO2: 99% 98%   CBC    Component Value Date/Time   WBC 16.2 (H) 09/22/2018 0756   RBC 3.30 (L) 09/22/2018 0756   HGB 10.9 (L) 09/22/2018 0756   HCT 32.6 (L) 09/22/2018 0756   PLT 244 09/22/2018 0756   MCV 98.8 09/22/2018 0756   MCH 33.0 09/22/2018 0756   MCHC 33.4 09/22/2018 0756   RDW 13.7 09/22/2018 0756   LYMPHSABS 1.6 03/29/2017 0617   MONOABS 0.7 03/29/2017 0617   EOSABS 0.7 03/29/2017 0617   BASOSABS 0.0 03/29/2017 0617    Gen: well appearing, no distress NCAT Neck : supple with FROM Lgs: CTA CV: RRR Abd: gravid, NT No CVAT LE: no edema, neg cords.  GU: exam deferred, Neuro: nonfocal Skin: intact  Toco: Occ irritability. Rare contraction noted FH: 125s-130s, + accels, no decels, 5-25 BTBV Category 1 tracing  x 3 . NST x 3.  SONO 11/12 Fetal Evaluation  Num Of Fetuses:         1  Cardiac Activity:       Observed  Presentation:           Transverse, head to maternal right  Placenta:               Placenta Previa  Amniotic Fluid  AFI FV:      Within normal limits  AFI Sum(cm)     %Tile       Largest Pocket(cm)  15.32           54          5.29  RUQ(cm)       RLQ(cm)       LUQ(cm)        LLQ(cm)  1.34          3.85          4.84           5.29  Comment:    ? [place] (4.6 x 0.9 x 5.8 cm) ---------------------------------------------------------------------- Biometry  BPD:        79  mm     G. Age:  31w 5d         86  %    CI:        74.19   %    70 - 86                                                          FL/HC:      19.2   %    19.2 - 21.4  HC:      291.2  mm     G. Age:   32w 1d         75  %    HC/AC:      0.98        0.99 - 1.21  AC:      297.2  mm     G. Age:  33w 5d       > 97  %    FL/BPD:     70.9   %    71 - 87  FL:         56  mm     G. Age:  29w 4d         22  %    FL/AC:      18.8   %    20 - 24  Est. FW:    1911  gm      4 lb 3 oz     83  %  NL 3gtt  BPP today 8/8 AFI 13  A/P:  [redacted]w[redacted]d IUP AGA by sono 11/12 Complete Previa- third bleed. Stable. 6x5cm marginal abruptio noted on sono now smaller. ?PPROM- unlikely based on clinical sxs and stable AFI Gestational anemia improved s/p Feraheme IVF pregnancy Chest pain of ? Etx. resolved- will do cardio consult prn.  BMZ  Complete-  rescue dose given 11/16  S/P MgSO4 neuroprotection  Monitor s/s chorio Inpatient until delivery  Tinley Rought J 09/27/2018 6:46 AM

## 2018-09-28 NOTE — Progress Notes (Signed)
HD # 19 31wks 5d Previa- 3rd bleed  Pt notes occ mild cramping. Continued minimum BRB dc , no heavy bleeding.  String serosanguinous dc increased this am. Crampy over night. Had more bleeding yesterday about 25cc total with a few small clots. Marked improvement this am. Good FM. No LOF. NO pain or contractions noted. Denies gush of fluid  PE:  Vitals:   09/28/18 0632 09/28/18 0852  BP: (!) 93/56 98/67  Pulse: 92 92  Resp: 16 16  Temp: 97.8 F (36.6 C) 97.8 F (36.6 C)  SpO2: 99% 99%   CBC    Component Value Date/Time   WBC 16.2 (H) 09/22/2018 0756   RBC 3.30 (L) 09/22/2018 0756   HGB 10.9 (L) 09/22/2018 0756   HCT 32.6 (L) 09/22/2018 0756   PLT 244 09/22/2018 0756   MCV 98.8 09/22/2018 0756   MCH 33.0 09/22/2018 0756   MCHC 33.4 09/22/2018 0756   RDW 13.7 09/22/2018 0756   LYMPHSABS 1.6 03/29/2017 0617   MONOABS 0.7 03/29/2017 0617   EOSABS 0.7 03/29/2017 0617   BASOSABS 0.0 03/29/2017 0617    Gen: well appearing, no distress NCAT Neck : supple with FROM Lgs: CTA CV: RRR Abd: gravid, NT No CVAT LE: no edema, neg cords.  GU: exam deferred, Neuro: nonfocal Skin: intact  Toco: Occ irritability. Rare contraction noted FH: 125s-130s, + accels, no decels, 5-25 BTBV Category 1 tracing  x 3 . NST x 3.  SONO 11/12 Fetal Evaluation  Num Of Fetuses:         1  Cardiac Activity:       Observed  Presentation:           Transverse, head to maternal right  Placenta:               Placenta Previa  Amniotic Fluid  AFI FV:      Within normal limits  AFI Sum(cm)     %Tile       Largest Pocket(cm)  15.32           54          5.29  RUQ(cm)       RLQ(cm)       LUQ(cm)        LLQ(cm)  1.34          3.85          4.84           5.29  Comment:    ? [place] (4.6 x 0.9 x 5.8 cm) ---------------------------------------------------------------------- Biometry  BPD:        79  mm     G. Age:  31w 5d         86  %    CI:        74.19   %    70 - 86                            FL/HC:      19.2   %    19.2 - 21.4  HC:      291.2  mm     G. Age:  32w 1d         75  %    HC/AC:      0.98        0.99 - 1.21  AC:      297.2  mm     G. Age:  33w 5d       > 97  %    FL/BPD:     70.9   %    71 - 87  FL:         56  mm     G. Age:  29w 4d         22  %    FL/AC:      18.8   %    20 - 24  Est. FW:    1911  gm      4 lb 3 oz     83  %  NL 3gtt   A/P:  [redacted]w[redacted]d IUP AGA by sono 11/12 Complete Previa- third bleed. Stable. 6x5cm marginal abruptio noted on sono now smaller. ?PPROM- unlikely based on clinical sxs and stable AFI Gestational anemia improved s/p Feraheme IVF pregnancy Chest pain of ? Etx. resolved- will do cardio consult prn.  BMZ  Complete-  rescue dose given 11/16  S/P MgSO4 neuroprotection  Monitor s/s chorio Not obvious evidence of PPROM although exam suspicous for bloody AF (per MD) on speculum exam done.  Goals for timing of delivery 35-36 weeks. Discussed with MFM Inpatient until delivery  Moses Odoherty J 09/28/2018 12:14 PM

## 2018-09-29 ENCOUNTER — Encounter (HOSPITAL_COMMUNITY): Payer: Self-pay | Admitting: Anesthesiology

## 2018-09-29 ENCOUNTER — Inpatient Hospital Stay (HOSPITAL_COMMUNITY): Payer: BLUE CROSS/BLUE SHIELD | Admitting: Anesthesiology

## 2018-09-29 ENCOUNTER — Encounter (HOSPITAL_COMMUNITY)
Admission: AD | Disposition: A | Discharge status: Home / Self Care | Payer: Self-pay | Source: Home / Self Care | Attending: Obstetrics and Gynecology

## 2018-09-29 LAB — CBC
HCT: 33.3 % — ABNORMAL LOW (ref 36.0–46.0)
Hemoglobin: 11 g/dL — ABNORMAL LOW (ref 12.0–15.0)
MCH: 33 pg (ref 26.0–34.0)
MCHC: 33 g/dL (ref 30.0–36.0)
MCV: 100 fL (ref 80.0–100.0)
Platelets: 260 10*3/uL (ref 150–400)
RBC: 3.33 MIL/uL — ABNORMAL LOW (ref 3.87–5.11)
RDW: 14.2 % (ref 11.5–15.5)
WBC: 17.1 10*3/uL — ABNORMAL HIGH (ref 4.0–10.5)
nRBC: 0 % (ref 0.0–0.2)

## 2018-09-29 LAB — PREPARE RBC (CROSSMATCH)

## 2018-09-29 SURGERY — Surgical Case
Anesthesia: Spinal

## 2018-09-29 MED ORDER — METHYLERGONOVINE MALEATE 0.2 MG/ML IJ SOLN: 0.2000 mg | INTRAMUSCULAR | Status: DC | PRN

## 2018-09-29 MED ORDER — SOD CITRATE-CITRIC ACID 500-334 MG/5ML PO SOLN: 30.0000 mL | Freq: Once | ORAL | Status: DC

## 2018-09-29 MED ORDER — ZOLPIDEM TARTRATE 5 MG PO TABS: 5.0000 mg | ORAL_TABLET | Freq: Every evening | ORAL | Status: DC | PRN

## 2018-09-29 MED ORDER — SIMETHICONE 80 MG PO CHEW: 80.0000 mg | CHEWABLE_TABLET | ORAL | Status: DC | PRN

## 2018-09-29 MED ORDER — OXYCODONE-ACETAMINOPHEN 5-325 MG PO TABS
1.0000 | ORAL_TABLET | ORAL | Status: DC | PRN
  Administered 2018-10-01 – 2018-10-03 (×6): 1 via ORAL
  Filled 2018-09-29 (×6): qty 1

## 2018-09-29 MED ORDER — MENTHOL 3 MG MT LOZG: 1.0000 | LOZENGE | OROMUCOSAL | Status: DC | PRN

## 2018-09-29 MED ORDER — SOD CITRATE-CITRIC ACID 500-334 MG/5ML PO SOLN
ORAL | Status: AC
  Administered 2018-09-29: 30 mL
  Filled 2018-09-29: qty 15

## 2018-09-29 MED ORDER — CEFAZOLIN SODIUM-DEXTROSE 2-4 GM/100ML-% IV SOLN
INTRAVENOUS | Status: AC
  Filled 2018-09-29: qty 100

## 2018-09-29 MED ORDER — DIPHENHYDRAMINE HCL 25 MG PO CAPS: 25.0000 mg | ORAL_CAPSULE | Freq: Four times a day (QID) | ORAL | Status: DC | PRN

## 2018-09-29 MED ORDER — TRANEXAMIC ACID-NACL 1000-0.7 MG/100ML-% IV SOLN
1000.0000 mg | INTRAVENOUS | Status: AC
  Administered 2018-09-29: 1000 mg via INTRAVENOUS
  Filled 2018-09-29: qty 100

## 2018-09-29 MED ORDER — NALBUPHINE HCL 10 MG/ML IJ SOLN: 5.0000 mg | INTRAMUSCULAR | Status: DC | PRN

## 2018-09-29 MED ORDER — DIPHENHYDRAMINE HCL 50 MG/ML IJ SOLN: 12.5000 mg | INTRAMUSCULAR | Status: DC | PRN

## 2018-09-29 MED ORDER — TRANEXAMIC ACID-NACL 1000-0.7 MG/100ML-% IV SOLN
INTRAVENOUS | Status: AC
  Filled 2018-09-29: qty 100

## 2018-09-29 MED ORDER — OXYCODONE HCL 5 MG PO TABS: 5.0000 mg | ORAL_TABLET | Freq: Once | ORAL | Status: DC | PRN

## 2018-09-29 MED ORDER — MORPHINE SULFATE (PF) 0.5 MG/ML IJ SOLN
INTRAMUSCULAR | Status: DC | PRN
  Administered 2018-09-29: 150 ug via INTRATHECAL

## 2018-09-29 MED ORDER — OXYCODONE-ACETAMINOPHEN 5-325 MG PO TABS
2.0000 | ORAL_TABLET | ORAL | Status: DC | PRN
  Administered 2018-09-30 – 2018-10-01 (×8): 2 via ORAL
  Filled 2018-09-29 (×8): qty 2

## 2018-09-29 MED ORDER — SENNOSIDES-DOCUSATE SODIUM 8.6-50 MG PO TABS
2.0000 | ORAL_TABLET | ORAL | Status: DC
  Administered 2018-09-30 – 2018-10-03 (×4): 2 via ORAL
  Filled 2018-09-29 (×4): qty 2

## 2018-09-29 MED ORDER — NALOXONE HCL 4 MG/10ML IJ SOLN
1.0000 ug/kg/h | INTRAVENOUS | Status: DC | PRN
  Filled 2018-09-29: qty 5

## 2018-09-29 MED ORDER — FENTANYL CITRATE (PF) 100 MCG/2ML IJ SOLN
INTRAMUSCULAR | Status: AC
  Filled 2018-09-29: qty 2

## 2018-09-29 MED ORDER — IBUPROFEN 600 MG PO TABS
600.0000 mg | ORAL_TABLET | Freq: Four times a day (QID) | ORAL | Status: DC
  Administered 2018-09-29 – 2018-10-03 (×16): 600 mg via ORAL
  Filled 2018-09-29 (×17): qty 1

## 2018-09-29 MED ORDER — CARBOPROST TROMETHAMINE 250 MCG/ML IM SOLN
INTRAMUSCULAR | Status: AC
  Filled 2018-09-29: qty 1

## 2018-09-29 MED ORDER — BUPIVACAINE HCL (PF) 0.25 % IJ SOLN
INTRAMUSCULAR | Status: AC
  Filled 2018-09-29: qty 30

## 2018-09-29 MED ORDER — SIMETHICONE 80 MG PO CHEW
80.0000 mg | CHEWABLE_TABLET | Freq: Three times a day (TID) | ORAL | Status: DC
  Administered 2018-09-30 – 2018-10-03 (×11): 80 mg via ORAL
  Filled 2018-09-29 (×11): qty 1

## 2018-09-29 MED ORDER — BUPIVACAINE HCL (PF) 0.25 % IJ SOLN
INTRAMUSCULAR | Status: DC | PRN
  Administered 2018-09-29: 30 mL

## 2018-09-29 MED ORDER — CEFAZOLIN SODIUM-DEXTROSE 2-3 GM-%(50ML) IV SOLR
INTRAVENOUS | Status: DC | PRN
  Administered 2018-09-29: 2 g via INTRAVENOUS

## 2018-09-29 MED ORDER — PHENYLEPHRINE HCL 10 MG/ML IJ SOLN
INTRAMUSCULAR | Status: DC | PRN
  Administered 2018-09-29: 80 ug via INTRAVENOUS

## 2018-09-29 MED ORDER — BUPIVACAINE HCL (PF) 0.25 % IJ SOLN
INTRAMUSCULAR | Status: AC
  Filled 2018-09-29: qty 10

## 2018-09-29 MED ORDER — MORPHINE SULFATE (PF) 0.5 MG/ML IJ SOLN
INTRAMUSCULAR | Status: AC
  Filled 2018-09-29: qty 10

## 2018-09-29 MED ORDER — VASOPRESSIN 20 UNIT/ML IV SOLN
Freq: Once | INTRAVENOUS | Status: DC
  Filled 2018-09-29: qty 50

## 2018-09-29 MED ORDER — ONDANSETRON HCL 4 MG/2ML IJ SOLN
INTRAMUSCULAR | Status: AC
  Filled 2018-09-29: qty 2

## 2018-09-29 MED ORDER — METOCLOPRAMIDE HCL 5 MG/ML IJ SOLN
INTRAMUSCULAR | Status: DC | PRN
  Administered 2018-09-29: 10 mg via INTRAVENOUS

## 2018-09-29 MED ORDER — MEPERIDINE HCL 25 MG/ML IJ SOLN: 6.2500 mg | INTRAMUSCULAR | Status: DC | PRN

## 2018-09-29 MED ORDER — METHYLERGONOVINE MALEATE 0.2 MG PO TABS: 0.2000 mg | ORAL_TABLET | ORAL | Status: DC | PRN

## 2018-09-29 MED ORDER — PHENYLEPHRINE 40 MCG/ML (10ML) SYRINGE FOR IV PUSH (FOR BLOOD PRESSURE SUPPORT)
PREFILLED_SYRINGE | INTRAVENOUS | Status: AC
  Filled 2018-09-29: qty 10

## 2018-09-29 MED ORDER — WITCH HAZEL-GLYCERIN EX PADS: 1.0000 "application " | MEDICATED_PAD | CUTANEOUS | Status: DC | PRN

## 2018-09-29 MED ORDER — ONDANSETRON HCL 4 MG/2ML IJ SOLN
INTRAMUSCULAR | Status: DC | PRN
  Administered 2018-09-29: 4 mg via INTRAVENOUS

## 2018-09-29 MED ORDER — BUPIVACAINE IN DEXTROSE 0.75-8.25 % IT SOLN
INTRATHECAL | Status: DC | PRN
  Administered 2018-09-29: 1.6 mL via INTRATHECAL

## 2018-09-29 MED ORDER — NALBUPHINE HCL 10 MG/ML IJ SOLN: 5.0000 mg | Freq: Once | INTRAMUSCULAR | Status: DC | PRN

## 2018-09-29 MED ORDER — DEXAMETHASONE SODIUM PHOSPHATE 4 MG/ML IJ SOLN
INTRAMUSCULAR | Status: AC
  Filled 2018-09-29: qty 1

## 2018-09-29 MED ORDER — LACTATED RINGERS IV SOLN
INTRAVENOUS | Status: DC | PRN
  Administered 2018-09-29 (×2): via INTRAVENOUS

## 2018-09-29 MED ORDER — OXYTOCIN 10 UNIT/ML IJ SOLN
INTRAVENOUS | Status: DC | PRN
  Administered 2018-09-29: 40 [IU] via INTRAVENOUS

## 2018-09-29 MED ORDER — LACTATED RINGERS IV SOLN
INTRAVENOUS | Status: DC
  Administered 2018-09-29: 22:00:00 via INTRAVENOUS

## 2018-09-29 MED ORDER — COCONUT OIL OIL
1.0000 "application " | TOPICAL_OIL | Status: DC | PRN
  Administered 2018-09-29: 1 via TOPICAL
  Filled 2018-09-29: qty 120

## 2018-09-29 MED ORDER — NIFEDIPINE 10 MG PO CAPS
10.0000 mg | ORAL_CAPSULE | Freq: Four times a day (QID) | ORAL | Status: DC
  Administered 2018-09-29: 10 mg via ORAL
  Filled 2018-09-29: qty 1

## 2018-09-29 MED ORDER — SODIUM CHLORIDE (PF) 0.9 % IJ SOLN
INTRAMUSCULAR | Status: AC
  Filled 2018-09-29: qty 50

## 2018-09-29 MED ORDER — OXYCODONE HCL 5 MG/5ML PO SOLN: 5.0000 mg | Freq: Once | ORAL | Status: DC | PRN

## 2018-09-29 MED ORDER — LACTATED RINGERS IV SOLN
INTRAVENOUS | Status: DC | PRN
  Administered 2018-09-29: 18:00:00 via INTRAVENOUS

## 2018-09-29 MED ORDER — SIMETHICONE 80 MG PO CHEW
80.0000 mg | CHEWABLE_TABLET | ORAL | Status: DC
  Administered 2018-09-30 – 2018-10-03 (×4): 80 mg via ORAL
  Filled 2018-09-29 (×4): qty 1

## 2018-09-29 MED ORDER — OXYTOCIN 10 UNIT/ML IJ SOLN
INTRAMUSCULAR | Status: AC
  Filled 2018-09-29: qty 4

## 2018-09-29 MED ORDER — FENTANYL CITRATE (PF) 100 MCG/2ML IJ SOLN
INTRAMUSCULAR | Status: DC | PRN
  Administered 2018-09-29: 15 ug via INTRATHECAL

## 2018-09-29 MED ORDER — OXYTOCIN 40 UNITS IN LACTATED RINGERS INFUSION - SIMPLE MED
2.5000 [IU]/h | INTRAVENOUS | Status: AC
  Administered 2018-09-29: 2.5 [IU]/h via INTRAVENOUS
  Filled 2018-09-29: qty 1000

## 2018-09-29 MED ORDER — ACETAMINOPHEN 325 MG PO TABS
650.0000 mg | ORAL_TABLET | ORAL | Status: DC | PRN
  Administered 2018-09-30: 650 mg via ORAL
  Filled 2018-09-29: qty 2

## 2018-09-29 MED ORDER — SODIUM CHLORIDE 0.9% IV SOLUTION: Freq: Once | INTRAVENOUS | Status: DC

## 2018-09-29 MED ORDER — FENTANYL CITRATE (PF) 100 MCG/2ML IJ SOLN
INTRAMUSCULAR | Status: AC
  Administered 2018-09-29: 50 ug via INTRAVENOUS
  Filled 2018-09-29: qty 2

## 2018-09-29 MED ORDER — DIPHENHYDRAMINE HCL 25 MG PO CAPS
25.0000 mg | ORAL_CAPSULE | ORAL | Status: DC | PRN
  Administered 2018-10-02 – 2018-10-03 (×4): 25 mg via ORAL
  Filled 2018-09-29 (×5): qty 1

## 2018-09-29 MED ORDER — PHENYLEPHRINE 8 MG IN D5W 100 ML (0.08MG/ML) PREMIX OPTIME
INJECTION | INTRAVENOUS | Status: DC | PRN
  Administered 2018-09-29: 60 ug/min via INTRAVENOUS

## 2018-09-29 MED ORDER — FENTANYL CITRATE (PF) 100 MCG/2ML IJ SOLN
25.0000 ug | INTRAMUSCULAR | Status: DC | PRN
  Administered 2018-09-29 (×3): 50 ug via INTRAVENOUS

## 2018-09-29 MED ORDER — TETANUS-DIPHTH-ACELL PERTUSSIS 5-2.5-18.5 LF-MCG/0.5 IM SUSP: 0.5000 mL | Freq: Once | INTRAMUSCULAR | Status: DC

## 2018-09-29 MED ORDER — PRENATAL MULTIVITAMIN CH
1.0000 | ORAL_TABLET | Freq: Every day | ORAL | Status: DC
  Administered 2018-09-30 – 2018-10-03 (×4): 1 via ORAL
  Filled 2018-09-29 (×4): qty 1

## 2018-09-29 MED ORDER — PHENYLEPHRINE 8 MG IN D5W 100 ML (0.08MG/ML) PREMIX OPTIME
INJECTION | INTRAVENOUS | Status: AC
  Filled 2018-09-29: qty 100

## 2018-09-29 MED ORDER — DIBUCAINE 1 % RE OINT: 1.0000 "application " | TOPICAL_OINTMENT | RECTAL | Status: DC | PRN

## 2018-09-29 SURGICAL SUPPLY — 34 items
CHLORAPREP W/TINT 26ML (MISCELLANEOUS) ×2 IMPLANT
CLAMP CORD UMBIL (MISCELLANEOUS) IMPLANT
CLOTH BEACON ORANGE TIMEOUT ST (SAFETY) ×2 IMPLANT
DERMABOND ADVANCED (GAUZE/BANDAGES/DRESSINGS) ×1
DERMABOND ADVANCED .7 DNX12 (GAUZE/BANDAGES/DRESSINGS) ×1 IMPLANT
DRSG OPSITE POSTOP 4X10 (GAUZE/BANDAGES/DRESSINGS) ×2 IMPLANT
ELECT REM PT RETURN 9FT ADLT (ELECTROSURGICAL) ×2
ELECTRODE REM PT RTRN 9FT ADLT (ELECTROSURGICAL) ×1 IMPLANT
EXTRACTOR VACUUM M CUP 4 TUBE (SUCTIONS) IMPLANT
GLOVE BIO SURGEON STRL SZ7.5 (GLOVE) ×2 IMPLANT
GLOVE BIOGEL PI IND STRL 7.0 (GLOVE) ×1 IMPLANT
GLOVE BIOGEL PI INDICATOR 7.0 (GLOVE) ×1
GOWN STRL REUS W/TWL LRG LVL3 (GOWN DISPOSABLE) ×4 IMPLANT
KIT ABG SYR 3ML LUER SLIP (SYRINGE) IMPLANT
NEEDLE HYPO 22GX1.5 SAFETY (NEEDLE) ×2 IMPLANT
NEEDLE HYPO 25X5/8 SAFETYGLIDE (NEEDLE) IMPLANT
NEEDLE SPNL 20GX3.5 QUINCKE YW (NEEDLE) IMPLANT
NS IRRIG 1000ML POUR BTL (IV SOLUTION) ×2 IMPLANT
PACK C SECTION WH (CUSTOM PROCEDURE TRAY) ×2 IMPLANT
PENCIL SMOKE EVAC W/HOLSTER (ELECTROSURGICAL) ×2 IMPLANT
SPONGE LAP 18X18 RF (DISPOSABLE) ×6 IMPLANT
SUT MNCRL 0 VIOLET CTX 36 (SUTURE) ×2 IMPLANT
SUT MNCRL AB 3-0 PS2 27 (SUTURE) IMPLANT
SUT MON AB 2-0 CT1 27 (SUTURE) ×2 IMPLANT
SUT MON AB-0 CT1 36 (SUTURE) ×4 IMPLANT
SUT MONOCRYL 0 CTX 36 (SUTURE) ×2
SUT PLAIN 0 NONE (SUTURE) IMPLANT
SUT PLAIN 2 0 (SUTURE)
SUT PLAIN 2 0 XLH (SUTURE) IMPLANT
SUT PLAIN ABS 2-0 CT1 27XMFL (SUTURE) IMPLANT
SYR 20CC LL (SYRINGE) IMPLANT
SYR CONTROL 10ML LL (SYRINGE) ×2 IMPLANT
TOWEL OR 17X24 6PK STRL BLUE (TOWEL DISPOSABLE) ×2 IMPLANT
TRAY FOLEY W/BAG SLVR 14FR LF (SET/KITS/TRAYS/PACK) ×2 IMPLANT

## 2018-09-29 NOTE — Progress Notes (Signed)
Patient ID: Jacqueline [name], female   DOB: 07/01/1991, 27 y.o.   MRN: [id number] Increased bleeding this afternoon. Now 510 cc today Increasing back pain. BP 109/68 (BP Location: Right Arm)   Pulse (!) 116   Temp 98 F (36.7 C) (Oral)   Resp 16   Ht 5\' 2"  (1.575 m)   Wt 65.3 kg   LMP 12/17/2017 Comment: spotting  SpO2 99%   BMI 26.35 kg/m    Fundus NT CBC    Component Value Date/Time   WBC 17.1 (H) 09/29/2018 1556   RBC 3.33 (L) 09/29/2018 1556   HGB 11.0 (L) 09/29/2018 1556   HCT 33.3 (L) 09/29/2018 1556   PLT 260 09/29/2018 1556   MCV 100.0 09/29/2018 1556   MCH 33.0 09/29/2018 1556   MCHC 33.0 09/29/2018 1556   RDW 14.2 09/29/2018 1556   LYMPHSABS 1.6 03/29/2017 0617   MONOABS 0.7 03/29/2017 0617   EOSABS 0.7 03/29/2017 0617   BASOSABS 0.0 03/29/2017 0617   Will proceed with csection. Consent done Risks vs benefits of surgery discussed with likely need for blood products and poss hysterectomy.  Pt acknowledges and will proceed.

## 2018-09-29 NOTE — Anesthesia Postprocedure Evaluation (Signed)
Anesthesia Post Note  Patient: Avriana Joo  Procedure(s) Performed: CESAREAN SECTION (N/A )     Patient location during evaluation: PACU Anesthesia Type: Spinal Level of consciousness: awake and alert Pain management: pain level controlled Vital Signs Assessment: post-procedure vital signs reviewed and stable Respiratory status: spontaneous breathing and respiratory function stable Cardiovascular status: blood pressure returned to baseline and stable Postop Assessment: spinal receding and no apparent nausea or vomiting Anesthetic complications: no    Last Vitals:  Vitals:   09/29/18 2019 09/29/18 2144  BP: 109/63 104/62  Pulse: 96 92  Resp: 18 17  Temp: 37 C 36.8 C  SpO2: 100% 99%    Last Pain:  Vitals:   09/29/18 2130  TempSrc:   PainSc: 1    Pain Goal: Patients Stated Pain Goal: 2 (09/29/18 1624)               Audry Pili

## 2018-09-29 NOTE — Anesthesia Preprocedure Evaluation (Signed)
Anesthesia Evaluation  Patient identified by MRN, date of birth, ID band Patient awake    Reviewed: Allergy & Precautions, NPO status , Patient's Chart, lab work & pertinent test results  History of Anesthesia Complications Negative for: history of anesthetic complications  Airway Mallampati: I  TM Distance: >3 FB Neck ROM: Full    Dental  (+) Dental Advisory Given, Teeth Intact   Pulmonary neg pulmonary ROS,    breath sounds clear to auscultation       Cardiovascular  Rhythm:Regular Rate:Normal   Prolonged QTc on EKG from 2018, now WNL on recent EKG    Neuro/Psych negative neurological ROS  negative psych ROS   GI/Hepatic negative GI ROS, Neg liver ROS,   Endo/Other  negative endocrine ROS  Renal/GU negative Renal ROS     Musculoskeletal negative musculoskeletal ROS (+)   Abdominal   Peds  Hematology  (+) anemia ,   Anesthesia Other Findings   Reproductive/Obstetrics (+) Pregnancy  Placenta previa Endometriosis                             Anesthesia Physical Anesthesia Plan  ASA: II and emergent  Anesthesia Plan: Spinal   Post-op Pain Management:    Induction:   PONV Risk Score and Plan: 2 and Treatment may vary due to age or medical condition, Ondansetron and Scopolamine patch - Pre-op  Airway Management Planned: Natural Airway  Additional Equipment: None  Intra-op Plan:   Post-operative Plan:   Informed Consent: I have reviewed the patients History and Physical, chart, labs and discussed the procedure including the risks, benefits and alternatives for the proposed anesthesia with the patient or authorized representative who has indicated his/her understanding and acceptance.     Plan Discussed with: CRNA, Anesthesiologist and Surgeon  Anesthesia Plan Comments: (Labs reviewed, platelets acceptable. Discussed risks and benefits of spinal, including spinal/epidural  hematoma, infection, failed block, and PDPH. Patient expressed understanding and wished to proceed. )        Anesthesia Quick Evaluation

## 2018-09-29 NOTE — Progress Notes (Signed)
Pt being wheeled down for baby shower.

## 2018-09-29 NOTE — Transfer of Care (Signed)
Immediate Anesthesia Transfer of Care Note  Patient: Jacqueline [name]  Procedure(s) Performed: CESAREAN SECTION (N/A )  Patient Location: PACU  Anesthesia Type:Spinal  Level of Consciousness: awake, alert  and oriented  Airway & Oxygen Therapy: Patient Spontanous Breathing  Post-op Assessment: Report given to RN and Post -op Vital signs reviewed and stable  Post vital signs: Reviewed and stable  Last Vitals:  Vitals Value Taken Time  BP    Temp    Pulse    Resp 39 09/29/2018  6:29 PM  SpO2    Vitals shown include unvalidated device data.  Last Pain:  Vitals:   09/29/18 1624  TempSrc:   PainSc: 4       Patients Stated Pain Goal: 2 (91/98/02 2179)  Complications: No apparent anesthesia complications

## 2018-09-29 NOTE — Progress Notes (Addendum)
Pt called out to request procardia. Pt reported that she was feeling some increased cramping and noted small clots- quarter sized- when using the bathroom. She reported good fetal movement. She said the cramping was not strong or regular. Will continue to monitor.

## 2018-09-29 NOTE — Progress Notes (Addendum)
Pt passed golf-ball sized clot. Dr. Ronita Hipps notified and received verbal order that pt can go down in wheelchair for baby shower.

## 2018-09-29 NOTE — Anesthesia Procedure Notes (Signed)
Spinal  Patient location during procedure: OR Start time: 09/29/2018 5:19 PM End time: 09/29/2018 5:22 PM Staffing Anesthesiologist: Audry Pili, MD Performed: anesthesiologist  Preanesthetic Checklist Completed: patient identified, surgical consent, pre-op evaluation, timeout performed, IV checked, risks and benefits discussed and monitors and equipment checked Spinal Block Patient position: sitting Prep: DuraPrep Patient monitoring: heart rate, cardiac monitor, continuous pulse ox and blood pressure Approach: midline Location: L3-4 Injection technique: single-shot Needle Needle type: Pencan  Needle gauge: 24 G Additional Notes Consent was obtained prior to the procedure with all questions answered and concerns addressed. Risks including, but not limited to, bleeding, infection, nerve damage, paralysis, failed block, inadequate analgesia, allergic reaction, high spinal, itching, and headache were discussed and the patient wished to proceed. Functioning IV was confirmed and monitors were applied. Sterile prep and drape, including hand hygiene, mask, and sterile gloves were used. The patient was positioned and the spine was prepped. The skin was anesthetized with lidocaine. Free flow of clear CSF was obtained prior to injecting local anesthetic into the CSF. The spinal needle aspirated freely following injection. The needle was carefully withdrawn. The patient tolerated the procedure well.   Renold Don, MD

## 2018-09-29 NOTE — Progress Notes (Signed)
HD # 20 31wks 6d Previa- 3rd bleed  Pt notes occ mild cramping. Continued minimum BRB dc , no heavy bleeding.  Received extra procardia this am. Feels better. Less cramping and no regular contractions. Marked improvement this am in bleeding from yesterday Good FM. No LOF. NO pain noted. Denies gush of fluid  PE:  Vitals:   09/29/18 0811 09/29/18 1144  BP: 109/73 109/68  Pulse: 97 (!) 116  Resp: 18 16  Temp: 98.2 F (36.8 C) 98 F (36.7 C)  SpO2: 99% 99%   CBC    Component Value Date/Time   WBC 16.2 (H) 09/22/2018 0756   RBC 3.30 (L) 09/22/2018 0756   HGB 10.9 (L) 09/22/2018 0756   HCT 32.6 (L) 09/22/2018 0756   PLT 244 09/22/2018 0756   MCV 98.8 09/22/2018 0756   MCH 33.0 09/22/2018 0756   MCHC 33.4 09/22/2018 0756   RDW 13.7 09/22/2018 0756   LYMPHSABS 1.6 03/29/2017 0617   MONOABS 0.7 03/29/2017 0617   EOSABS 0.7 03/29/2017 0617   BASOSABS 0.0 03/29/2017 0617    Gen: well appearing, no distress NCAT Neck : supple with FROM Lgs: CTA CV: RRR Abd: gravid, NT No CVAT LE: no edema, neg cords.  GU: exam deferred, Neuro: nonfocal Skin: intact  Toco: Occ irritability. Rare contraction noted FH: 125s-130s, + accels, no decels, 5-25 BTBV Category 1 tracing  x 3 . NST x 3.  SONO 11/12 Fetal Evaluation  Num Of Fetuses:         1  Cardiac Activity:       Observed  Presentation:           Transverse, head to maternal right  Placenta:               Placenta Previa  Amniotic Fluid  AFI FV:      Within normal limits  AFI Sum(cm)     %Tile       Largest Pocket(cm)  15.32           54          5.29  RUQ(cm)       RLQ(cm)       LUQ(cm)        LLQ(cm)  1.34          3.85          4.84           5.29  Comment:    ? [place] (4.6 x 0.9 x 5.8 cm) ---------------------------------------------------------------------- Biometry  BPD:        79  mm     G. Age:  31w 5d         86  %    CI:        74.19   %    70 - 86  FL/HC:      19.2   %    19.2 - 21.4  HC:      291.2  mm     G. Age:  32w 1d         75  %    HC/AC:      0.98        0.99 - 1.21  AC:      297.2  mm     G. Age:  33w 5d       > 97  %    FL/BPD:     70.9   %    71 - 87  FL:         56  mm     G. Age:  29w 4d         22  %    FL/AC:      18.8   %    20 - 24  Est. FW:    1911  gm      4 lb 3 oz     83  %  NL 3gtt   A/P:  [redacted]w[redacted]d IUP AGA by sono 11/12 Complete Previa- third bleed. Stable. 6x5cm marginal abruptio noted on sono now smaller. ?PPROM- unlikely based on clinical sxs and stable AFI Gestational anemia improved s/p Feraheme IVF pregnancy Chest pain of ? Etx. resolved- will consider cardio consult prn.  BMZ  Complete-  rescue dose given 11/16  S/P MgSO4 neuroprotection  Monitor s/s chorio Not obvious evidence of PPROM although exam suspicous for bloody AF (per MD) on speculum exam done.  Goals for timing of delivery 35-36 weeks. Discussed with MFM Inpatient until delivery  Jacqueline [name] J 09/29/2018 12:41 PM

## 2018-09-29 NOTE — Op Note (Signed)
Note dictated [phone]

## 2018-09-29 NOTE — Progress Notes (Signed)
Pt transported to OR at 17:10. CHG bath performed, betadine nose swabs done, and oracet given orally.  Notified NICU team, anesthesiologist, labor and delivery, and OR.

## 2018-09-30 ENCOUNTER — Encounter (HOSPITAL_COMMUNITY): Payer: Self-pay

## 2018-09-30 LAB — CBC
HCT: 26.8 % — ABNORMAL LOW (ref 36.0–46.0)
Hemoglobin: 9.1 g/dL — ABNORMAL LOW (ref 12.0–15.0)
MCH: 34 pg (ref 26.0–34.0)
MCHC: 34 g/dL (ref 30.0–36.0)
MCV: 100 fL (ref 80.0–100.0)
Platelets: 199 10*3/uL (ref 150–400)
RBC: 2.68 MIL/uL — ABNORMAL LOW (ref 3.87–5.11)
RDW: 14.1 % (ref 11.5–15.5)
WBC: 14.6 10*3/uL — ABNORMAL HIGH (ref 4.0–10.5)
nRBC: 0 % (ref 0.0–0.2)

## 2018-09-30 NOTE — Anesthesia Postprocedure Evaluation (Signed)
Anesthesia Post Note  Patient: Jacqueline [name]  Procedure(s) Performed: CESAREAN SECTION (N/A )     Patient location during evaluation: [facility] Anesthesia Type: Spinal Level of consciousness: awake and alert Pain management: pain level controlled Vital Signs Assessment: post-procedure vital signs reviewed and stable Respiratory status: spontaneous breathing, nonlabored ventilation and respiratory function stable Cardiovascular status: stable Postop Assessment: no headache, no backache, spinal receding, able to ambulate, adequate PO intake, no apparent nausea or vomiting and patient able to bend at knees Anesthetic complications: no    Last Vitals:  Vitals:   09/30/18 0540 09/30/18 0836  BP:  100/69  Pulse:  76  Resp:  18  Temp:  36.9 C  SpO2: 98% 99%    Last Pain:  Vitals:   09/30/18 0836  TempSrc: Oral  PainSc:    Pain Goal: Patients Stated Pain Goal: 4 (09/30/18 0800)               Jabier Mutton

## 2018-09-30 NOTE — Lactation Note (Signed)
This note was copied from a baby's chart. Lactation Consultation Note  Patient Name: Jacqueline [name] AYTKZ'S Date: 09/30/2018 Reason for consult: Initial assessment;NICU baby;Preterm <34wks Mom is pumping every 3 hours and obtaining 5-10 mls of colostrum.  Instructed to also do hand expression after pumping on both breasts.  Mom has a Medela pump in style for home use.  Providing Breastmilk For Your Baby In NICU booklet given.  Lactation services and support information given and reviewed.  Discussed preterm feeding and how baby should progress as he reaches term.  Encouraged skin to skin and to call with questions/concerns.  Maternal Data Has patient been taught Hand Expression?: Yes Does the patient have breastfeeding experience prior to this delivery?: No  Feeding    LATCH Score                   Interventions    Lactation Tools Discussed/Used Pump Review: Setup, frequency, and cleaning;Milk Storage Initiated by:: RN Date initiated:: 09/29/18   Consult Status Consult Status: Follow-up Date: 10/01/18 Follow-up type: In-patient    Ave Filter 09/30/2018, 9:29 AM

## 2018-09-30 NOTE — Addendum Note (Signed)
Addendum  created 09/30/18 0349 by Hewitt Blade, CRNA   Sign clinical note

## 2018-09-30 NOTE — Op Note (Signed)
[name], Jacqueline [name] MEDICAL RECORD KP:[id number] ACCOUNT [id number] DATE OF BIRTH:[birth year] FACILITY: [facility] LOCATION: [id number] PHYSICIAN:Natasha Burda J. Koran Seabrook, MD  OPERATIVE REPORT  DATE OF PROCEDURE:    PREOPERATIVE DIAGNOSIS:  Placenta previa with increased bleeding at 31 weeks and 6 days' gestation.  POSTOPERATIVE DIAGNOSIS:  Placenta previa with increased bleeding at 31 weeks and 6 days' gestation plus large placental abruption.  PROCEDURE:  Urgent primary low segment transverse cesarean section.  SURGEON:  Brien Few, MD  ASSISTANTManon Hilding, MD  ANESTHESIA:  Spinal and local.  ESTIMATED BLOOD LOSS:  500 mL.  COMPLICATIONS:  None.  DRAINS:  Foley.  COUNTS:  Correct.  DISPOSITION:  The patient was taken to the recovery in good condition.  SPECIMENS:  Placenta to pathology.  BRIEF OPERATIVE NOTE:  After being apprised of the risks of anesthesia, infection, bleeding, and surrounding organs, possible need for repair, delayed versus immediate complications including bowel and bladder injury, possible need for repair, possible  need for blood products and consent signed, the patient was brought to the operating room and was administered a spinal anesthetic without complications.  Prepped and draped in usual sterile fashion.  Foley catheter was placed.  At this time, a  Pfannenstiel skin incision was made with a scalpel and carried down to the fascia, which was nicked in the midline and opened transversely using Mayo scissors.  Rectus muscles dissected sharply in the midline.  Peritoneum entered sharply.  Bladder blade  placed.  Uterus was scored in a smile-like fashion.  A Kerr hysterotomy incision was made.  Upon entering the uterus, there was about 50-100 mL of blood clots extruded and partial separation of placenta, maybe 40% was noted.  Amniotomy clear fluid.   Conversion of the transverse to footling breech presentation and delivered without difficulty.  The baby was handed  off without delayed cord clamping due to the separation of the placenta as noted.  At this time, Apgars were assigned per NICU protocol,  and baby was taken to NICU.  The placenta was then extracted from the uterus to the area of the placental bed, and the right lateral edges were sutured using chromic suture.  Incisions were closed in 2 running layers of 0 Monocryl suture.  Bladder flap  was inspected and found to be hemostatic.  Irrigation accomplished.  Peritoneum closed with 2-0 Monocryl in continuous running fashion.  Subcutaneous tissue reapproximated using 2-0 plain and skin closed using 3-0 Monocryl.  Dilute Marcaine solution and  placed Dermabond.  A honeycomb dressing placed.  The patient tolerated the procedure well, awakened and transferred to recovery in good condition.  LN/NUANCE  D:09/29/2018 T:09/29/2018 JOB:[id number]

## 2018-09-30 NOTE — Progress Notes (Signed)
Subjective: Postpartum Day 1: Cesarean Delivery Patient reports nausea, incisional pain, tolerating PO, + flatus and no problems voiding.    Objective: Vital signs in last 24 hours: Temp:  [97.8 F (36.6 C)-98.6 F (37 C)] 98.4 F (36.9 C) (11/25 0343) Pulse Rate:  [69-116] 69 (11/25 0343) Resp:  [13-19] 18 (11/25 0343) BP: (96-109)/(56-73) 107/58 (11/25 0343) SpO2:  [95 %-100 %] 98 % (11/25 0540)  Physical Exam:  General: alert, cooperative and appears stated age Lochia: appropriate Uterine Fundus: firm Incision: healing well, no significant drainage, no dehiscence DVT Evaluation: No evidence of DVT seen on physical exam. Negative Homan's sign.  Recent Labs    09/29/18 1556 09/30/18 0525  HGB 11.0* 9.1*  HCT 33.3* 26.8*    Assessment/Plan: Status post Cesarean section. Doing well postoperatively.  Continue current care.  Jamorris Ndiaye J 09/30/2018, 8:32 AM

## 2018-10-01 LAB — CBC WITH DIFFERENTIAL/PLATELET
Basophils Absolute: 0 10*3/uL (ref 0.0–0.1)
Basophils Relative: 0 %
Eosinophils Absolute: 0.5 10*3/uL (ref 0.0–0.5)
Eosinophils Relative: 2 %
HCT: 28.6 % — ABNORMAL LOW (ref 36.0–46.0)
Hemoglobin: 9.4 g/dL — ABNORMAL LOW (ref 12.0–15.0)
Lymphocytes Relative: 11 %
Lymphs Abs: 2.6 10*3/uL (ref 0.7–4.0)
MCH: 33.5 pg (ref 26.0–34.0)
MCHC: 32.9 g/dL (ref 30.0–36.0)
MCV: 101.8 fL — ABNORMAL HIGH (ref 80.0–100.0)
Monocytes Absolute: 0.9 10*3/uL (ref 0.1–1.0)
Monocytes Relative: 4 %
Neutro Abs: 19.2 10*3/uL — ABNORMAL HIGH (ref 1.7–7.7)
Neutrophils Relative %: 83 %
Other: 0 %
Platelets: 261 10*3/uL (ref 150–400)
RBC: 2.81 MIL/uL — ABNORMAL LOW (ref 3.87–5.11)
RDW: 14.6 % (ref 11.5–15.5)
WBC: 23.2 10*3/uL — ABNORMAL HIGH (ref 4.0–10.5)
nRBC: 0 % (ref 0.0–0.2)

## 2018-10-01 MED ORDER — MAGNESIUM OXIDE 400 (241.3 MG) MG PO TABS
400.0000 mg | ORAL_TABLET | Freq: Every day | ORAL | Status: DC
  Administered 2018-10-01 – 2018-10-03 (×3): 400 mg via ORAL
  Filled 2018-10-01 (×5): qty 1

## 2018-10-01 MED ORDER — FERROUS SULFATE 325 (65 FE) MG PO TABS
325.0000 mg | ORAL_TABLET | Freq: Two times a day (BID) | ORAL | Status: DC
  Administered 2018-10-01 – 2018-10-03 (×6): 325 mg via ORAL
  Filled 2018-10-01 (×6): qty 1

## 2018-10-01 MED ORDER — GENTAMICIN SULFATE 40 MG/ML IJ SOLN
130.0000 mg | Freq: Three times a day (TID) | INTRAVENOUS | Status: DC
  Administered 2018-10-01 – 2018-10-03 (×7): 130 mg via INTRAVENOUS
  Filled 2018-10-01 (×7): qty 3.25

## 2018-10-01 MED ORDER — LACTATED RINGERS IV SOLN
INTRAVENOUS | Status: DC
  Administered 2018-10-01: 20:00:00 via INTRAVENOUS

## 2018-10-01 MED ORDER — CLINDAMYCIN PHOSPHATE 900 MG/50ML IV SOLN: 900.0000 mg | Freq: Three times a day (TID) | INTRAVENOUS | Status: DC

## 2018-10-01 NOTE — Progress Notes (Signed)
Lars Pinks CNM notified of pt's continued erythema above her honeycomb dressing that is increasing in size up to her umbilicus. Per CNM- CBC with diff ordered. WBC of 23.2 called to CNM.

## 2018-10-01 NOTE — Progress Notes (Signed)
Interval note: Notified by RN that patient's has an an increase in erythema midline abdomen above honeycomb dressing to her umbilicus.  Warmth and tenderness noted. Pt. Reports tenderness with fundal massage. No foul smelling lochia or fevers.  Assessed earlier today by me. Orders given for repeat CBC with diff.  Results for orders placed or performed during the hospital encounter of 09/10/18 (from the past 24 hour(s))  CBC with Differential/Platelet     Status: Abnormal   Collection Time: 10/01/18  6:08 PM  Result Value Ref Range   WBC 23.2 (H) 4.0 - 10.5 K/uL   RBC 2.81 (L) 3.87 - 5.11 MIL/uL   Hemoglobin 9.4 (L) 12.0 - 15.0 g/dL   HCT 28.6 (L) 36.0 - 46.0 %   MCV 101.8 (H) 80.0 - 100.0 fL   MCH 33.5 26.0 - 34.0 pg   MCHC 32.9 30.0 - 36.0 g/dL   RDW 14.6 11.5 - 15.5 %   Platelets 261 150 - 400 K/uL   nRBC 0.0 0.0 - 0.2 %   Neutrophils Relative % 83 %   Lymphocytes Relative 11 %   Monocytes Relative 4 %   Eosinophils Relative 2 %   Basophils Relative 0 %   Other 0 %   Neutro Abs 19.2 (H) 1.7 - 7.7 K/uL   Lymphs Abs 2.6 0.7 - 4.0 K/uL   Monocytes Absolute 0.9 0.1 - 1.0 K/uL   Eosinophils Absolute 0.5 0.0 - 0.5 K/uL   Basophils Absolute 0.0 0.0 - 0.1 K/uL   WBC Morphology ATYPICAL LYMPHOCYTES   Elevated WBC and neutro abs. Mild Tachycardia  Today's Vitals   10/01/18 1554 10/01/18 1600 10/01/18 1644 10/01/18 1956  BP:  108/69  98/66  Pulse:  (!) 107  100  Resp:  18  17  Temp:  98.6 F (37 C)  97.9 F (36.6 C)  TempSrc:  Oral  Oral  SpO2:  99%  100%  Weight:      Height:      PainSc: 4   3    Consulted Dr. Ronita Hipps: presumed postpartum endometritis  Plan: Insert PIV Clindamycin IV 900mg  every 8 hours  Gentamicin per pharmacy consult Continue to monitor closely Will plan for repeat CBC with diff after 24 hrs of abx Continue inpatient care  Lars Pinks, CNM

## 2018-10-01 NOTE — Progress Notes (Signed)
Pharmacy Antibiotic Note  Jacqueline [name] is a 27 y.o. female admitted on 09/10/2018 with placenta previa .  Now, pt is Day #2 s/p primary LTCS at 31+ weeks. Pharmacy has been consulted for Gentamicin dosing for increased WBC.  Plan:  Gentamicin 130mg  IV q8h Wiil continue to follow and assess need for Gentamicin levels Height: 5\' 2"  (157.5 cm) Weight: 144 lb 0.6 oz (65.3 kg) IBW/kg (Calculated) : 50.1  Adjusted/ Dosing BW: 54.7kg   Temp (24hrs), Avg:98.7 F (37.1 C), Min:97.8 F (36.6 C), Max:99.4 F (37.4 C)  Recent Labs  Lab 09/29/18 1556 09/30/18 0525 10/01/18 1808  WBC 17.1* 14.6* 23.2*    Estimated Creatinine Clearance: 92.9 mL/min (by C-G formula based on SCr of 0.64 mg/dL).    Allergies  Allergen Reactions  . Shellfish Allergy Anaphylaxis  . Ciprofloxacin     Caused QT prolongation  . Clavulanic Acid Rash    Had rash with Augmentin as a child but tolerates amoxicillin;     Antimicrobials this admission: Clindamycin 900mg  IV q8h  11/26 >> Amp/AMox for PPROM  11/17- 11/24 Azithromycin for PPROM  11/17-11/18     Thank you for allowing pharmacy to be a part of this patient's care.  Vernie Ammons 10/01/2018 7:41 PM

## 2018-10-01 NOTE — Lactation Note (Signed)
This note was copied from a baby's chart. Lactation Consultation Note  Patient Name: Jacqueline [name] NVBTY'O Date: 10/01/2018  Mom continues to pump every 3 hours and obtains 5-10 mls of colostrum each time.  Obtains the most with hand expression.  No questions or concerns.   Maternal Data    Feeding Feeding Type: Donor Breast Milk  LATCH Score                   Interventions    Lactation Tools Discussed/Used     Consult Status      Ave Filter 10/01/2018, 1:22 PM

## 2018-10-01 NOTE — Progress Notes (Signed)
POSTOPERATIVE DAY # 2 S/P Primary LTCS at 31+6 weeks for complete placenta previa with increased bleeding, baby boy in NICU   S:         Reports feeling better today, still sore  C/o redness and tenderness above the honeycomb dressing below umbilicus              Tolerating po intake / no nausea / no vomiting / + flatus / no BM  Denies dizziness, SOB, or CP             Bleeding is light             Pain controlled with Motrin and Percocet             Up ad lib / ambulatory/ voiding QS  Newborn in NICU - doing well per mom; pumping colostrum   O:  VS: BP (!) 85/53 (BP Location: Left Arm)   Pulse 88   Temp 98.6 F (37 C) (Oral)   Resp 18   Ht 5\' 2"  (1.575 m)   Wt 65.3 kg   LMP 12/17/2017 Comment: spotting  SpO2 99%   Breastfeeding? Unknown   BMI 26.35 kg/m    LABS:               Recent Labs    09/29/18 1556 09/30/18 0525  WBC 17.1* 14.6*  HGB 11.0* 9.1*  PLT 260 199               Bloodtype: --/--/A POS (11/23 0733)  Rubella:                                               I&O: Intake/Output      11/25 0701 - 11/26 0700   I.V. (mL/kg) 432.7 (6.6)   Total Intake(mL/kg) 432.7 (6.6)   Urine (mL/kg/hr) 900 (0.6)   Total Output 900   Net -467.3       Tdap UTD             Physical Exam:             Alert and Oriented X3  Lungs: Clear and unlabored  Heart: regular rate and rhythm / no murmurs  Abdomen: soft, non-tender, non-distended, active bowel sounds; erythema and warmth noted above honeycomb dressing; tender on palpation; continue to monitor              Fundus: firm, non-tender, U-2             Dressing: honeycomb dsg with Dermabond              Incision:  approximated with sutures / no erythema / no ecchymosis / no drainage  Perineum: intact  Lochia: small, no clots   Extremities: no edema, no calf pain or tenderness,  A:        POD # 2 S/P Primary LTCS            ABL Anemia   P:        Routine postoperative care              Ferrous sulfate 325mg  PO BID  WC  Magnesium sulfate 400mg  PO daily  See lactation today  Continue to monitor erythema over abd  Anticipate d/c home tomorrow   Lars Pinks, MSN, CNM [facility]

## 2018-10-02 LAB — CBC WITH DIFFERENTIAL/PLATELET
Basophils Absolute: 0 10*3/uL (ref 0.0–0.1)
Basophils Relative: 0 %
Eosinophils Absolute: 0.8 10*3/uL — ABNORMAL HIGH (ref 0.0–0.5)
Eosinophils Relative: 4 %
HCT: 27.1 % — ABNORMAL LOW (ref 36.0–46.0)
Hemoglobin: 9 g/dL — ABNORMAL LOW (ref 12.0–15.0)
Lymphocytes Relative: 14 %
Lymphs Abs: 2.7 10*3/uL (ref 0.7–4.0)
MCH: 33.7 pg (ref 26.0–34.0)
MCHC: 33.2 g/dL (ref 30.0–36.0)
MCV: 101.5 fL — ABNORMAL HIGH (ref 80.0–100.0)
Monocytes Absolute: 0.6 10*3/uL (ref 0.1–1.0)
Monocytes Relative: 3 %
Neutro Abs: 15.5 10*3/uL — ABNORMAL HIGH (ref 1.7–7.7)
Neutrophils Relative %: 79 %
Platelets: 321 10*3/uL (ref 150–400)
RBC: 2.67 MIL/uL — ABNORMAL LOW (ref 3.87–5.11)
RDW: 14.3 % (ref 11.5–15.5)
WBC: 19.5 10*3/uL — ABNORMAL HIGH (ref 4.0–10.5)
nRBC: 0 % (ref 0.0–0.2)

## 2018-10-02 LAB — TYPE AND SCREEN
ABO/RH(D): A POS
Antibody Screen: NEGATIVE
Unit division: 0
Unit division: 0

## 2018-10-02 LAB — BPAM RBC
Blood Product Expiration Date: 201912062359
Blood Product Expiration Date: 201912192359
ISSUE DATE / TIME: 201911241725
ISSUE DATE / TIME: 201911260737
Unit Type and Rh: 6200
Unit Type and Rh: 6200

## 2018-10-02 LAB — RPR: RPR Ser Ql: NONREACTIVE

## 2018-10-02 MED ORDER — CLINDAMYCIN PHOSPHATE 900 MG/50ML IV SOLN: 900.0000 mg | Freq: Three times a day (TID) | INTRAVENOUS | Status: DC

## 2018-10-02 NOTE — Progress Notes (Signed)
Spoke with Lars Pinks, CNM that pt is having abd distention and increasing redness around incision and that it has spread and gotten worse since 1700 today. Will do CBC stat now, and she will review with Dr Ronita Hipps and reevaluate. Will call with results. Wille Celeste

## 2018-10-02 NOTE — Progress Notes (Signed)
POSTOPERATIVE DAY # 3 S/P Primary LTCS at 31+6 weeks for complete placenta previa with increased bleeding, baby boy in NICU   S:         Reports feeling okay; concerned that erythema and tenderness has spread from abdomen to umbilicus to side of her abdomen and down her hips; also reports new labial erythema and swelling. Upset that she cannot visit NICU for 24 hrs. Denies itching over area.              Tolerating po intake / no nausea / no vomiting / + flatus / no BM  Denies dizziness, SOB, or CP             Bleeding is light             Pain controlled with Motrin and Percocet             Up ad lib / ambulatory/ voiding QS   Newborn in NICU - doing well per mom; pumping colostrum; states milk has not come in yet   O:  VS: BP 106/69 (BP Location: Right Arm)   Pulse (!) 102   Temp 98.5 F (36.9 C) (Oral)   Resp 17   Ht 5\' 2"  (1.575 m)   Wt 65.3 kg   LMP 12/17/2017 Comment: spotting  SpO2 100%   Breastfeeding? Unknown   BMI 26.35 kg/m  Vitals:   10/01/18 1956 10/01/18 2329 10/02/18 0344 10/02/18 0805  BP: 98/66 98/68 104/73 106/69  Pulse: 100 71 88 (!) 102  Resp: 17  17 17   Temp: 97.9 F (36.6 C) 98 F (36.7 C) 98 F (36.7 C) 98.5 F (36.9 C)  TempSrc: Oral Oral Oral Oral  SpO2: 100% 100% 100% 100%  Weight:      Height:        LABS:               Recent Labs    09/30/18 0525 10/01/18 1808  WBC 14.6* 23.2*  HGB 9.1* 9.4*  PLT 199 261               Bloodtype: --/--/A POS (11/23 0733)  Rubella:                                               I&O: Intake/Output      11/26 0701 - 11/27 0700 11/27 0701 - 11/28 0700   I.V. (mL/kg)     Total Intake(mL/kg)     Urine (mL/kg/hr)  200 (1)   Total Output  200   Net  -200         Flu and Tdap UTD             Physical Exam:             Alert and Oriented X3  Lungs: Clear and unlabored  Heart: regular rate and rhythm / no murmurs  Abdomen: soft, tender below umbilicus (lower abdomen), erythema and warmth noted above  honeycomb dressing to umbilicus with extension to hips bilaterally, non-distended, active bowel sounds in all quadrants              Fundus: firm, tender on palpation, U-2             Dressing: honeycomb dressing with Dermabond c/d/i             Incision:  approximated with sutures / no erythema / no ecchymosis / no drainage  Perineum: intact; new onset labial majora edema with erythema, non-tender  Lochia: scant, none on pad, no odors, no clots   Extremities: no edema, no calf pain or tenderness  A/P:     POD # 3 S/P Primary LTCS            ABL Anemia    - stable on Ferrous sulfate   Presumed endometritis vs. Cellulitis   - On Gentamicin and Clindamycin q8 hours     - Erythema, tenderness, warmth, Leukocytosis, afebrile   - Repeat CBC in 24 hours from start of abx - 9pm   - Continue to monitor s/s  Routine postoperative care              Continue inpatient management   Encouraged to continue pumping   Consult for plan: Dr. Stevan Born, MSN, CNM [facility]

## 2018-10-02 NOTE — Progress Notes (Signed)
Late entry note: Called by RN at 7pm stating pt. Having abdominal distention where erythema has been, probable edema. States the erythema has spread to umbilicus and hips. RN reports pt. Has bowel sounds. Afebrile.  Repeat CBC now.   F/u: 7:55pm reviewed CBC with RN and Dr. Ronita Hipps Results for orders placed or performed during the hospital encounter of 09/10/18 (from the past 24 hour(s))  CBC with Differential/Platelet     Status: Abnormal   Collection Time: 10/02/18  7:19 PM  Result Value Ref Range   WBC 19.5 (H) 4.0 - 10.5 K/uL   RBC 2.67 (L) 3.87 - 5.11 MIL/uL   Hemoglobin 9.0 (L) 12.0 - 15.0 g/dL   HCT 27.1 (L) 36.0 - 46.0 %   MCV 101.5 (H) 80.0 - 100.0 fL   MCH 33.7 26.0 - 34.0 pg   MCHC 33.2 30.0 - 36.0 g/dL   RDW 14.3 11.5 - 15.5 %   Platelets 321 150 - 400 K/uL   nRBC 0.0 0.0 - 0.2 %   Neutrophils Relative % 79 %   Neutro Abs 15.5 (H) 1.7 - 7.7 K/uL   Lymphocytes Relative 14 %   Lymphs Abs 2.7 0.7 - 4.0 K/uL   Monocytes Relative 3 %   Monocytes Absolute 0.6 0.1 - 1.0 K/uL   Eosinophils Relative 4 %   Eosinophils Absolute 0.8 (H) 0.0 - 0.5 K/uL   Basophils Relative 0 %   Basophils Absolute 0.0 0.0 - 0.1 K/uL  A: Differential diagnoses: postpartum endometritis, cellulitis, allergic reaction to adhesive from drape Plan: recommend Benadryl 25mg  PO every 6 hrs PRN for erythema. WBC improving. Continue Gentamicin and Clindamycin. Advised to notify for worsening s/s. S/p Benadryl at 2000, and per RN at 9:50pm, pt. Has not seen any improvement yet. Plan to repeat CBC with diff in AM  Consult for plan: Dr. Stevan Born, CNM

## 2018-10-02 NOTE — Progress Notes (Signed)
Pt off unit yo visit baby in NICU. Jacqueline [name]

## 2018-10-03 LAB — CBC WITH DIFFERENTIAL/PLATELET
Basophils Absolute: 0 10*3/uL (ref 0.0–0.1)
Basophils Relative: 0 %
Eosinophils Absolute: 0.7 10*3/uL — ABNORMAL HIGH (ref 0.0–0.5)
Eosinophils Relative: 5 %
HCT: 25.4 % — ABNORMAL LOW (ref 36.0–46.0)
Hemoglobin: 8.5 g/dL — ABNORMAL LOW (ref 12.0–15.0)
Lymphocytes Relative: 18 %
Lymphs Abs: 2.9 10*3/uL (ref 0.7–4.0)
MCH: 34 pg (ref 26.0–34.0)
MCHC: 33.5 g/dL (ref 30.0–36.0)
MCV: 101.6 fL — ABNORMAL HIGH (ref 80.0–100.0)
Monocytes Absolute: 0.7 10*3/uL (ref 0.1–1.0)
Monocytes Relative: 5 %
Neutro Abs: 11.4 10*3/uL — ABNORMAL HIGH (ref 1.7–7.7)
Neutrophils Relative %: 72 %
Platelets: 306 10*3/uL (ref 150–400)
RBC: 2.5 MIL/uL — ABNORMAL LOW (ref 3.87–5.11)
RDW: 14.3 % (ref 11.5–15.5)
WBC: 15.7 10*3/uL — ABNORMAL HIGH (ref 4.0–10.5)
nRBC: 0 % (ref 0.0–0.2)

## 2018-10-03 MED ORDER — FERROUS SULFATE 325 (65 FE) MG PO TABS: 325.0000 mg | ORAL_TABLET | Freq: Two times a day (BID) | ORAL | 3 refills | Status: DC

## 2018-10-03 MED ORDER — DIPHENHYDRAMINE HCL 25 MG PO CAPS: 25.0000 mg | ORAL_CAPSULE | Freq: Four times a day (QID) | ORAL | 0 refills | Status: DC | PRN

## 2018-10-03 MED ORDER — IBUPROFEN 600 MG PO TABS: 600.0000 mg | ORAL_TABLET | Freq: Four times a day (QID) | ORAL | 0 refills | Status: DC

## 2018-10-03 MED ORDER — HYDROCORTISONE 0.5 % EX CREA: 1.0000 "application " | TOPICAL_CREAM | Freq: Two times a day (BID) | CUTANEOUS | 0 refills | Status: DC

## 2018-10-03 MED ORDER — COCONUT OIL OIL: 1.0000 "application " | TOPICAL_OIL | 0 refills | Status: DC | PRN

## 2018-10-03 MED ORDER — OXYCODONE-ACETAMINOPHEN 5-325 MG PO TABS: 1.0000 | ORAL_TABLET | ORAL | 0 refills | Status: DC | PRN

## 2018-10-03 MED ORDER — MAGNESIUM OXIDE 400 (241.3 MG) MG PO TABS: 400.0000 mg | ORAL_TABLET | Freq: Every day | ORAL | Status: DC

## 2018-10-03 NOTE — Progress Notes (Signed)
POSTOPERATIVE DAY # 4 S/P Primary LTCS at 31+6 weeks for complete placenta previa with increased bleeding, baby boy in NICU   S:         Reports feeling better. Erythema and swelling markedly improved s/p Benadryl.              Tolerating po intake / no nausea / no vomiting / + flatus / no BM  Denies dizziness, SOB, or CP             Bleeding is light             Pain controlled with Motrin and Percocet             Up ad lib / ambulatory/ voiding QS   Newborn in NICU - doing well per mom; pumping colostrum; states milk has not come in yet   O:  VS: BP 106/81 (BP Location: Right Arm)   Pulse 83   Temp 98.8 F (37.1 C) (Oral)   Resp 18   Ht 5\' 2"  (1.575 m)   Wt 65.3 kg   LMP 12/17/2017 Comment: spotting  SpO2 100%   Breastfeeding? Unknown   BMI 26.35 kg/m  Vitals:   10/02/18 1945 10/02/18 2325 10/03/18 0530 10/03/18 0847  BP: 90/67 (!) 85/53 102/62 106/81  Pulse: 64 60 68 83  Resp: 17 17 17 18   Temp: 98.4 F (36.9 C) 97.6 F (36.4 C) 98.4 F (36.9 C) 98.8 F (37.1 C)  TempSrc: Oral Oral Oral Oral  SpO2:   100% 100%  Weight:      Height:        LABS:               Recent Labs    10/02/18 1919 10/03/18 0528  WBC 19.5* 15.7*  HGB 9.0* 8.5*  PLT 321 306               Bloodtype: --/--/A POS (11/23 0733)  Rubella:                                               I&O: Intake/Output      11/27 0701 - 11/28 0700 11/28 0701 - 11/29 0700   Urine (mL/kg/hr) 2350 (1.5)    Total Output 2350    Net -2350          Flu and Tdap UTD             Physical Exam:             Alert and Oriented X3  Lungs: Clear and unlabored  Heart: regular rate and rhythm / no murmurs  Abdomen: soft, nontender non-distended, active bowel sounds in all quadrants              Fundus: firm, tender on palpation, U-2             Dressing: honeycomb dressing with Dermabond c/d/i             Incision:  approximated with sutures / no erythema / no ecchymosis / no drainage  Perineum: intact; new  onset labial majora edema with erythema, non-tender  Lochia: scant, none on pad, no odors, no clots   Extremities: no edema, no calf pain or tenderness  A/P:     POD # 4 S/P Primary LTCS  ABL Anemia    - stable on Ferrous sulfate   Presumed endometritis- clinically resolved   - On Gentamicin and Clindamycin q8 hours     -Leukocytosis improved, afebrile     - Continue to monitor s/s  Likely contact dermatitis- resolving on benadryl             Desires DC home tonight             Will finish 48hr of ABX and dc home tonight if stable.

## 2018-10-03 NOTE — Discharge Summary (Signed)
OB Discharge Summary  Patient Name: Jacqueline [name] DOB: 04-05-1991 MRN: [id number]  Date of admission: 09/10/2018 Delivering provider: Brien Few   Date of discharge: 10/03/2018  Admitting diagnosis: 29WKS BLEEDING Intrauterine pregnancy: [redacted]w[redacted]d     Secondary diagnosis:Principal Problem:   Postpartum care following cesarean delivery (11/24) Active Problems:   Complete placenta previa with hemorrhage, third trimester   Cesarean delivery  Additional problems:contact dermatitis     Discharge diagnosis:  Patient Active Problem List   Diagnosis Date Noted  . Cesarean delivery 09/29/2018  . Postpartum care following cesarean delivery (11/24) 09/29/2018  . Complete placenta previa with hemorrhage, third trimester 09/10/2018  . Antepartum hemorrhage from placenta previa 08/20/2018  . Enterocolitis 03/28/2017  . Leukocytosis 03/28/2017  . Thrombocytosis ([place]) 03/28/2017  . Prolonged Q-T interval on ECG 03/28/2017  . Endometriosis 03/20/2017  . 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 Multinodular goiter 05/29/2016  . Allergic rhinitis   . Neck nodule 05/26/2016                                                                Post partum procedures:none  Pain control: Spinal  Complications: Placental Abruption  Hospital course:  Sceduled C/S   27 y.o. yo G2P0111 at [redacted]w[redacted]d was admitted to the hospital 09/10/2018 at [redacted]w[redacted]d for  Previa and active hemorrhage. Remained inpatient with intermittent small bleeding episodes until [redacted]w[redacted]d when large bleed noted and she was urgently delivered via cesarean section. Membrane Rupture Time/Date: 5:41 PM ,09/29/2018   Patient delivered a Viable infant.09/29/2018  Details of operation can be found in separate operative note.  Pateint had a postpartum course complicated by presumed endometritis treated with 48 hours intravenous gentamycin and clindamycin, leukocytosis resolving on 2nd day of antibiotics course. She was also noted to have contact dermatitis  over abdominal operating drape site which improved with oral Benadryl .  She is ambulating, tolerating a regular diet, passing flatus, and urinating well. Patient is discharged home in stable condition on  10/03/18         Physical exam  Vitals:   10/02/18 1945 10/02/18 2325 10/03/18 0530 10/03/18 0847  BP: 90/67 (!) 85/53 102/62 106/81  Pulse: 64 60 68 83  Resp: 17 17 17 18   Temp: 98.4 F (36.9 C) 97.6 F (36.4 C) 98.4 F (36.9 C) 98.8 F (37.1 C)  TempSrc: Oral Oral Oral Oral  SpO2:   100% 100%  Weight:      Height:       General: alert, cooperative and no distress Lochia: appropriate Uterine Fundus: firm Incision: Healing well with no significant drainage DVT Evaluation: No cords or calf tenderness. No significant calf/ankle edema. Labs: Lab Results  Component Value Date   WBC 15.7 (H) 10/03/2018   HGB 8.5 (L) 10/03/2018   HCT 25.4 (L) 10/03/2018   MCV 101.6 (H) 10/03/2018   PLT 306 10/03/2018   CMP Latest Ref Rng & Units 09/22/2018  Glucose 70 - 99 mg/dL 125(H)  BUN 6 - 20 mg/dL <5(L)  Creatinine 0.44 - 1.00 mg/dL 0.64  Sodium 135 - 145 mmol/L 137  Potassium 3.5 - 5.1 mmol/L 3.5  Chloride 98 - 111 mmol/L 103  CO2 22 - 32 mmol/L 25  Calcium 8.9 - 10.3 mg/dL 7.3(L)  Total Protein 6.5 - 8.1  g/dL 5.6(L)  Total Bilirubin 0.3 - 1.2 mg/dL 0.7  Alkaline Phos 38 - 126 U/L 62  AST 15 - 41 U/L 20  ALT 0 - 44 U/L 19    Vaccines: TDaP UTD         Flu    UTD  Discharge instruction: per After Visit Summary and "Baby and Me Booklet".  After Visit Meds:  Allergies as of 10/03/2018      Reactions   Shellfish Allergy Anaphylaxis   Ciprofloxacin    Caused QT prolongation   Clavulanic Acid Rash   Had rash with Augmentin as a child but tolerates amoxicillin;       Medication List    TAKE these medications   acetaminophen 325 MG tablet Commonly known as:  TYLENOL Take 650 mg by mouth every 6 (six) hours as needed for moderate pain.   calcium carbonate 500 MG  chewable tablet Commonly known as:  TUMS - dosed in mg elemental calcium Chew 2 tablets by mouth 2 (two) times daily as needed for indigestion or heartburn.   coconut oil Oil Apply 1 application topically as needed.   diphenhydrAMINE 25 mg capsule Commonly known as:  BENADRYL Take 1 capsule (25 mg total) by mouth every 6 (six) hours as needed for itching.   ferrous sulfate 325 (65 FE) MG tablet Take 1 tablet (325 mg total) by mouth 2 (two) times daily with a meal.   hydrocortisone cream 0.5 % Apply 1 application topically 2 (two) times daily.   ibuprofen 600 MG tablet Commonly known as:  ADVIL,MOTRIN Take 1 tablet (600 mg total) by mouth every 6 (six) hours.   loratadine 10 MG tablet Commonly known as:  CLARITIN Take 10 mg by mouth daily.   magnesium oxide 400 (241.3 Mg) MG tablet Commonly known as:  MAG-OX Take 1 tablet (400 mg total) by mouth daily.   oxyCODONE-acetaminophen 5-325 MG tablet Commonly known as:  PERCOCET/ROXICET Take 1 tablet by mouth every 4 (four) hours as needed (pain scale 4-7).   prenatal multivitamin Tabs tablet Take 1 tablet by mouth daily at 12 noon.       Diet: routine diet  Activity: Advance as tolerated. Pelvic rest for 6 weeks.   Postpartum contraception: Not Discussed  Newborn Data: Live born female  Birth Weight: 4 lb 9 oz (2070 g) APGAR: 7, 9  Newborn Delivery   Birth date/time:  09/29/2018 17:42:00 Delivery type:  C-Section, Low Transverse Trial of labor:  No C-section categorization:  Primary    Baby Feeding: per NICU Disposition:NICU   Delivery Report:  Review the Delivery Report for details.    Follow up: Follow-up Information    Brien Few, MD. Schedule an appointment as soon as possible for a visit in 6 week(s).   Specialty:  Obstetrics and Gynecology Contact information: [address] [place] [address] [phone]             Signed: Otilio Carpen, MSN 10/03/2018, 12:50 PM

## 2018-10-07 DIAGNOSIS — L03818 Cellulitis of other sites: Secondary | ICD-10-CM | POA: Diagnosis not present

## 2018-10-07 DIAGNOSIS — Z5189 Encounter for other specified aftercare: Secondary | ICD-10-CM | POA: Diagnosis not present

## 2018-10-10 DIAGNOSIS — L03818 Cellulitis of other sites: Secondary | ICD-10-CM | POA: Diagnosis not present

## 2018-10-15 DIAGNOSIS — L03818 Cellulitis of other sites: Secondary | ICD-10-CM | POA: Diagnosis not present

## 2018-10-19 ENCOUNTER — Ambulatory Visit: Payer: Self-pay

## 2018-10-19 NOTE — Lactation Note (Signed)
This note was copied from a baby's chart. Lactation Consultation Note  Patient Name: Jacqueline [name] [id number] Date: 10/19/2018 Reason for consult: Follow-up assessment;NICU baby;Preterm <34wks Called to NICU to assist with a feeding.  Baby is 19 weeks old.  Mom is pumping every 3 hours and has an abundant milk supply.  Baby is getting continuous  NG feeding.  He is awake and showing feeding cues.  20 mm nipple shield applied.  Mom has erect but short nipples.  Baby did some off and on suckling for 10 minutes.  Milk in the shield when baby came off.  Reassured and reviewed normal preterm feeding.  Encouraged to call for concerns or assist prn.  16 mm nipple shield also left.  Maternal Data    Feeding    LATCH Score                   Interventions    Lactation Tools Discussed/Used     Consult Status Consult Status: PRN    Ave Filter 10/19/2018, 1:37 PM

## 2018-11-10 DIAGNOSIS — Z412 Encounter for routine and ritual male circumcision: Secondary | ICD-10-CM | POA: Diagnosis not present

## 2018-11-12 DIAGNOSIS — Z124 Encounter for screening for malignant neoplasm of cervix: Secondary | ICD-10-CM | POA: Diagnosis not present

## 2019-02-18 ENCOUNTER — Telehealth: Payer: Self-pay | Admitting: Obstetrics & Gynecology

## 2019-02-18 NOTE — Telephone Encounter (Signed)
Patient would like a virtual birth control consult. Is currently breast feeding.

## 2019-02-18 NOTE — Telephone Encounter (Signed)
Spoke with patient. Patient would like a Web ex appointment to discuss OCP. Patient is breast feeding. Patient had a C-Section on 09/29/2018. Web Ex appointment scheduled for tomorrow morning at 9:30 am with Dr.Silva. Patient is agreeable to date and time. Advised she will receive an email to confirmed email on file with instructions for Web Ex appointment. Patient is agreeable.  Cc: Lamont Snowball, RN  Routing to provider and will close encounter.

## 2019-02-19 ENCOUNTER — Other Ambulatory Visit: Payer: Self-pay

## 2019-02-19 ENCOUNTER — Other Ambulatory Visit: Payer: Self-pay | Admitting: Obstetrics and Gynecology

## 2019-02-19 ENCOUNTER — Encounter: Payer: Self-pay | Admitting: Obstetrics and Gynecology

## 2019-02-19 ENCOUNTER — Ambulatory Visit (INDEPENDENT_AMBULATORY_CARE_PROVIDER_SITE_OTHER): Payer: BLUE CROSS/BLUE SHIELD | Admitting: Obstetrics and Gynecology

## 2019-02-19 DIAGNOSIS — Z3009 Encounter for other general counseling and advice on contraception: Principal | ICD-10-CM

## 2019-02-19 MED ORDER — NORETHINDRONE 0.35 MG PO TABS: 1.0000 | ORAL_TABLET | Freq: Every day | ORAL | 0 refills | Status: DC

## 2019-02-19 NOTE — Progress Notes (Signed)
GYNECOLOGY  VISIT   HPI: 28 y.o.   Married  Caucasian  female   G2P0111 with No LMP recorded.   here for Web Ex visit regarding her need for birth control.  Patient gives her consent for the WebEx visit.  Consultation started 9:51 am and ended 10:10 am. She is at home and I am at work.  Lamont Snowball, clinical nursing supervisor assisted in setting up the visit.   Patient states her menses started yesterday.   Had a premature delivery with her son in November, and he was discharged to home in January, 2020.  Patient states she is pumping her breast mild.  She had her postpartum visit with her OB and did not start on birth control.  Is sexually active.  Not using condoms.   She did IVF for her pregnancy.   Wants to do Nuvaring after she has finished breastfeeding.   GYNECOLOGIC HISTORY: No LMP recorded. Contraception:  None. Menopausal hormone therapy:  NA Last mammogram:  NA Last pap smear:  04/26/16 - neg, neg HR HPV        OB History    Gravida  2   Para  1   Term  0   Preterm  1   AB  1   Living  1     SAB  1   TAB  0   Ectopic  0   Multiple  0   Live Births  1              Patient Active Problem List   Diagnosis Date Noted  . Cesarean delivery 09/29/2018  . Postpartum care following cesarean delivery (11/24) 09/29/2018  . Complete placenta previa with hemorrhage, third trimester 09/10/2018  . Antepartum hemorrhage from placenta previa 08/20/2018  . Enterocolitis 03/28/2017  . Leukocytosis 03/28/2017  . Thrombocytosis ([place]) 03/28/2017  . Prolonged Q-T interval on ECG 03/28/2017  . Endometriosis 03/20/2017  . 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 Multinodular goiter 05/29/2016  . Allergic rhinitis   . Neck nodule 05/26/2016    Past Medical History:  Diagnosis Date  . Allergic rhinitis   . Endometriosis   . Thyroid nodule     Past Surgical History:  Procedure Laterality Date  . CESAREAN SECTION N/A 09/29/2018   Procedure: CESAREAN  SECTION;  Surgeon: Brien Few, MD;  Location: [facility];  Service: Obstetrics;  Laterality: N/A;  . CHROMOPERTUBATION Bilateral 03/20/2017   Procedure: CHROMOPERTUBATION;  Surgeon: Megan Salon, MD;  Location: [facility];  Service: Gynecology;  Laterality: Bilateral;  Fallopian tubes  . CYSTOSCOPY N/A 03/20/2017   Procedure: CYSTOSCOPY;  Surgeon: Megan Salon, MD;  Location: [facility];  Service: Gynecology;  Laterality: N/A;  . FOOT SURGERY Left    neuromona  . LAPAROSCOPIC OVARIAN CYSTECTOMY Bilateral 03/20/2017   Procedure: LAPAROSCOPIC OVARIAN CYSTECTOMY;  Surgeon: Megan Salon, MD;  Location: [facility];  Service: Gynecology;  Laterality: Bilateral;  . LAPAROSCOPIC UNILATERAL SALPINGECTOMY Right 03/20/2017   Procedure: LAPAROSCOPIC UNILATERAL SALPINGECTOMY;  Surgeon: Megan Salon, MD;  Location: [facility];  Service: Gynecology;  Laterality: Right;  . LAPAROSCOPY Bilateral 03/20/2017   Procedure: LAPAROSCOPY OPERATIVE  WITH LYSIS OF ADHESIONS;  Surgeon: Megan Salon, MD;  Location: [facility];  Service: Gynecology;  Laterality: Bilateral;  . WISDOM TOOTH EXTRACTION      Current Outpatient Medications  Medication Sig Dispense Refill  . acetaminophen (TYLENOL) 325 MG tablet Take 650 mg by mouth every 6 (six) hours as needed for  moderate pain.    . calcium carbonate (TUMS - DOSED IN MG ELEMENTAL CALCIUM) 500 MG chewable tablet Chew 2 tablets by mouth 2 (two) times daily as needed for indigestion or heartburn.    . coconut oil OIL Apply 1 application topically as needed.  0  . diphenhydrAMINE (BENADRYL) 25 mg capsule Take 1 capsule (25 mg total) by mouth every 6 (six) hours as needed for itching. 30 capsule 0  . ferrous sulfate 325 (65 FE) MG tablet Take 1 tablet (325 mg total) by mouth 2 (two) times daily with a meal.  3  . hydrocortisone cream 0.5 % Apply 1 application topically 2 (two) times daily. 30 g 0  . ibuprofen (ADVIL,MOTRIN) 600 MG tablet Take 1 tablet (600 mg total) by mouth every  6 (six) hours. 30 tablet 0  . loratadine (CLARITIN) 10 MG tablet Take 10 mg by mouth daily.    . magnesium oxide (MAG-OX) 400 (241.3 Mg) MG tablet Take 1 tablet (400 mg total) by mouth daily.    Marland Kitchen oxyCODONE-acetaminophen (PERCOCET/ROXICET) 5-325 MG tablet Take 1 tablet by mouth every 4 (four) hours as needed (pain scale 4-7). 30 tablet 0  . Prenatal Vit-Fe Fumarate-FA (PRENATAL MULTIVITAMIN) TABS tablet Take 1 tablet by mouth daily at 12 noon.     No current facility-administered medications for this visit.      ALLERGIES: Shellfish allergy; Ciprofloxacin; and Clavulanic acid  Family History  Problem Relation Age of Onset  . Breast cancer Mother 34        mastectomy  . Thyroid disease Neg Hx     Social History   Socioeconomic History  . Marital status: Married    Spouse name: Not on file  . Number of children: Not on file  . Years of education: Not on file  . Highest education level: Not on file  Occupational History  . Not on file  Social Needs  . Financial resource strain: Not on file  . Food insecurity:    Worry: Not on file    Inability: Not on file  . Transportation needs:    Medical: Not on file    Non-medical: Not on file  Tobacco Use  . Smoking status: Never Smoker  . Smokeless tobacco: Never Used  Substance and Sexual Activity  . Alcohol use: Not Currently    Alcohol/week: 1.0 - 3.0 standard drinks    Types: 1 - 3 Standard drinks or equivalent per week    Comment: occasional   . Drug use: No  . Sexual activity: Not Currently    Partners: Male    Birth control/protection: None  Lifestyle  . Physical activity:    Days per week: Not on file    Minutes per session: Not on file  . Stress: Not on file  Relationships  . Social connections:    Talks on phone: Not on file    Gets together: Not on file    Attends religious service: Not on file    Active member of club or organization: Not on file    Attends meetings of clubs or organizations: Not on file     Relationship status: Not on file  . Intimate partner violence:    Fear of current or ex partner: Not on file    Emotionally abused: Not on file    Physically abused: Not on file    Forced sexual activity: Not on file  Other Topics Concern  . Not on file  Social History Narrative  .  Not on file    Review of Systems  PHYSICAL EXAMINATION:    NA.    ASSESSMENT  Need for contraception.  Recent delivery. Pumping breast milk.  Hx endometriosis.   PLAN  We discussed options for contraception - POPs, Depo Provera, Nexplanon, IUDs, and barrier methods. Risks and benefits reviewed.  She will start Micronor.  Instructed in use.  Rx for 3 months.  She will return for her annual exam and pap with Dr. Sabra Heck in 3 months.    An After Visit Summary was printed and given to the patient.  __29____ minutes consultation.

## 2019-03-07 ENCOUNTER — Other Ambulatory Visit: Payer: Self-pay

## 2019-03-07 MED ORDER — NORETHINDRONE 0.35 MG PO TABS: 1.0000 | ORAL_TABLET | Freq: Every day | ORAL | 0 refills | Status: DC

## 2019-03-07 NOTE — Telephone Encounter (Signed)
Please check with pharmacy.  I sent in this 3 month Micronor prescription in April.

## 2019-03-07 NOTE — Telephone Encounter (Signed)
Yes you did but we received a refill request [facility]. [facility]. Please approve if appropriate.

## 2019-03-07 NOTE — Telephone Encounter (Signed)
Medication refill request: norethindrone 0.35mg  Virtual visit 02-19-2019:  Next AEX: not scheduled Last MMG (if hormonal medication request): none Refill authorized: [facility] is requesting 56mth supply. Please approve if appropriate.

## 2019-04-07 ENCOUNTER — Telehealth: Payer: Self-pay | Admitting: Obstetrics and Gynecology

## 2019-04-07 NOTE — Telephone Encounter (Signed)
Patient is on the pill but want to switch back to nuvaring.

## 2019-04-08 ENCOUNTER — Other Ambulatory Visit: Payer: Self-pay | Admitting: Obstetrics & Gynecology

## 2019-04-08 MED ORDER — ETONOGESTREL-ETHINYL ESTRADIOL 0.12-0.015 MG/24HR VA RING: VAGINAL_RING | VAGINAL | 2 refills | Status: DC

## 2019-04-08 NOTE — Telephone Encounter (Signed)
Notified patient Dr.Miller had sent Rx for Nuvaring to pharmacy.

## 2019-04-08 NOTE — Telephone Encounter (Signed)
Spoke with patient. She has stopped breastfeeding. Patient would like to switch from Micronor to [place] now. Patient has AEX with Dr.Miller 05-12-19. Routed to provider. Confirmed pharmacy on file.

## 2019-04-08 NOTE — Telephone Encounter (Signed)
Rx for nuva ring, one ring vaginally x 3 weeks, then one week out, sent to pharmacy on file ([facility] [address]) with 2 RFs.  Thanks.

## 2019-04-30 DIAGNOSIS — I8312 Varicose veins of left lower extremity with inflammation: Secondary | ICD-10-CM | POA: Diagnosis not present

## 2019-04-30 DIAGNOSIS — I8311 Varicose veins of right lower extremity with inflammation: Secondary | ICD-10-CM | POA: Diagnosis not present

## 2019-05-08 NOTE — Progress Notes (Signed)
28 y.o. G2P0111 Married White or Caucasian female here for annual exam.  Doing well.  Using the Nuva ring for contraception.  Trying to use continuous active rings.  Having significant dryness with the ring.    No LMP recorded.          Sexually active: Yes.    The current method of family planning is NuvaRing vaginal inserts.    Exercising: Yes.    Utube  Smoker:  no  Health Maintenance: Pap:  04/28/16  Neg  History of abnormal Pap:  yes MMG: n/a TDaP:  10/12/17 Pneumonia vaccine(s):  NA Shingrix:   NA Hep C testing: no  Screening Labs: if needed    reports that she has never smoked. She has never used smokeless tobacco. She reports previous alcohol use of about 1.0 - 3.0 standard drinks of alcohol per week. She reports that she does not use drugs.  Past Medical History:  Diagnosis Date  . Allergic rhinitis   . Endometriosis   . Thyroid nodule     Past Surgical History:  Procedure Laterality Date  . CESAREAN SECTION N/A 09/29/2018   Procedure: CESAREAN SECTION;  Surgeon: Brien Few, MD;  Location: [facility];  Service: Obstetrics;  Laterality: N/A;  . CHROMOPERTUBATION Bilateral 03/20/2017   Procedure: CHROMOPERTUBATION;  Surgeon: Megan Salon, MD;  Location: [facility];  Service: Gynecology;  Laterality: Bilateral;  Fallopian tubes  . CYSTOSCOPY N/A 03/20/2017   Procedure: CYSTOSCOPY;  Surgeon: Megan Salon, MD;  Location: [facility];  Service: Gynecology;  Laterality: N/A;  . FOOT SURGERY Left    neuromona  . LAPAROSCOPIC OVARIAN CYSTECTOMY Bilateral 03/20/2017   Procedure: LAPAROSCOPIC OVARIAN CYSTECTOMY;  Surgeon: Megan Salon, MD;  Location: [facility];  Service: Gynecology;  Laterality: Bilateral;  . LAPAROSCOPIC UNILATERAL SALPINGECTOMY Right 03/20/2017   Procedure: LAPAROSCOPIC UNILATERAL SALPINGECTOMY;  Surgeon: Megan Salon, MD;  Location: [facility];  Service: Gynecology;  Laterality: Right;  . LAPAROSCOPY Bilateral 03/20/2017   Procedure: LAPAROSCOPY OPERATIVE   WITH LYSIS OF ADHESIONS;  Surgeon: Megan Salon, MD;  Location: [facility];  Service: Gynecology;  Laterality: Bilateral;  . WISDOM TOOTH EXTRACTION      Current Outpatient Medications  Medication Sig Dispense Refill  . acetaminophen (TYLENOL) 325 MG tablet Take 650 mg by mouth every 6 (six) hours as needed for moderate pain.    . calcium carbonate (TUMS - DOSED IN MG ELEMENTAL CALCIUM) 500 MG chewable tablet Chew 2 tablets by mouth 2 (two) times daily as needed for indigestion or heartburn.    . coconut oil OIL Apply 1 application topically as needed.  0  . diphenhydrAMINE (BENADRYL) 25 mg capsule Take 1 capsule (25 mg total) by mouth every 6 (six) hours as needed for itching. 30 capsule 0  . etonogestrel-ethinyl estradiol (NUVARING) 0.12-0.015 MG/24HR vaginal ring Insert vaginally and leave in place for 3 consecutive weeks, then remove for 1 week. 1 each 2  . ferrous sulfate 325 (65 FE) MG tablet Take 1 tablet (325 mg total) by mouth 2 (two) times daily with a meal.  3  . hydrocortisone cream 0.5 % Apply 1 application topically 2 (two) times daily. 30 g 0  . ibuprofen (ADVIL,MOTRIN) 600 MG tablet Take 1 tablet (600 mg total) by mouth every 6 (six) hours. 30 tablet 0  . loratadine (CLARITIN) 10 MG tablet Take 10 mg by mouth daily.    . magnesium oxide (MAG-OX) 400 (241.3 Mg) MG tablet Take 1 tablet (400 mg  total) by mouth daily.    . norethindrone (MICRONOR) 0.35 MG tablet Take 1 tablet (0.35 mg total) by mouth daily. 3 Package 0  . oxyCODONE-acetaminophen (PERCOCET/ROXICET) 5-325 MG tablet Take 1 tablet by mouth every 4 (four) hours as needed (pain scale 4-7). 30 tablet 0  . Prenatal Vit-Fe Fumarate-FA (PRENATAL MULTIVITAMIN) TABS tablet Take 1 tablet by mouth daily at 12 noon.     No current facility-administered medications for this visit.     Family History  Problem Relation Age of Onset  . Breast cancer Mother 76        mastectomy  . Thyroid disease Neg Hx     Review of Systems   All other systems reviewed and are negative.   Exam:   There were no vitals taken for this visit.  Height:      Ht Readings from Last 3 Encounters:  09/10/18 5\' 2"  (1.575 m)  08/19/18 5\' 2"  (1.575 m)  08/16/18 5\' 2"  (1.575 m)    General appearance: alert, cooperative and appears stated age Head: Normocephalic, without obvious abnormality, atraumatic Neck: no adenopathy, supple, symmetrical, trachea midline and thyroid normal to inspection and palpation Lungs: clear to auscultation bilaterally Breasts: normal appearance, no masses or tenderness Heart: regular rate and rhythm Abdomen: soft, non-tender; bowel sounds normal; no masses,  no organomegaly Extremities: extremities normal, atraumatic, no cyanosis or edema Skin: Skin color, texture, turgor normal. No rashes or lesions Lymph nodes: Cervical, supraclavicular, and axillary nodes normal. No abnormal inguinal nodes palpated Neurologic: Grossly normal   Pelvic: External genitalia:  no lesions              Urethra:  normal appearing urethra with no masses, tenderness or lesions              Bartholins and Skenes: normal                 Vagina: normal appearing vagina with normal color and discharge, no lesions              Cervix: no lesions              Pap taken: Yes.   Bimanual Exam:  Uterus:  normal size, contour, position, consistency, mobility, non-tender              Adnexa: normal adnexa and no mass, fullness, tenderness               Rectovaginal: Confirms               Anus:  normal sphincter tone, no lesions  Chaperone was present for exam.  A:  Well Woman with normal exam On nuva ring, continuous active Family hx of breast cancer Vaginal dryness  P:   Mammogram screening should start at age 56 due mother's breast cancer hx Pap smear obtained today RF for Nuva ring, continuous active ring use Will try vaginal estrogen cream, Estrace, 1gm pv one to two times weekly.  42.5gm/2RF Return annually or prn

## 2019-05-12 ENCOUNTER — Other Ambulatory Visit (HOSPITAL_COMMUNITY)
Admission: RE | Admit: 2019-05-12 | Discharge: 2019-05-12 | Disposition: A | Discharge status: Home / Self Care | Payer: BC Managed Care – PPO | Source: Ambulatory Visit | Attending: Obstetrics & Gynecology | Admitting: Obstetrics & Gynecology

## 2019-05-12 ENCOUNTER — Other Ambulatory Visit: Payer: Self-pay | Admitting: Obstetrics and Gynecology

## 2019-05-12 ENCOUNTER — Ambulatory Visit (INDEPENDENT_AMBULATORY_CARE_PROVIDER_SITE_OTHER): Payer: BC Managed Care – PPO | Admitting: Obstetrics & Gynecology

## 2019-05-12 ENCOUNTER — Other Ambulatory Visit: Payer: Self-pay

## 2019-05-12 ENCOUNTER — Encounter: Payer: Self-pay | Admitting: Obstetrics & Gynecology

## 2019-05-12 VITALS — BP 100/62 | HR 64 | Temp 98.3°F | Resp 14 | Ht 62.25 in | Wt 121.0 lb

## 2019-05-12 DIAGNOSIS — Z01419 Encounter for gynecological examination (general) (routine) without abnormal findings: Secondary | ICD-10-CM

## 2019-05-12 DIAGNOSIS — Z124 Encounter for screening for malignant neoplasm of cervix: Secondary | ICD-10-CM

## 2019-05-12 MED ORDER — ETONOGESTREL-ETHINYL ESTRADIOL 0.12-0.015 MG/24HR VA RING: VAGINAL_RING | VAGINAL | 4 refills | Status: DC

## 2019-05-12 MED ORDER — ESTRADIOL 0.1 MG/GM VA CREA: TOPICAL_CREAM | VAGINAL | 2 refills | Status: DC

## 2019-05-14 LAB — CYTOLOGY - PAP: Diagnosis: NEGATIVE

## 2019-05-17 DIAGNOSIS — Z20828 Contact with and (suspected) exposure to other viral communicable diseases: Secondary | ICD-10-CM | POA: Diagnosis not present

## 2019-05-21 DIAGNOSIS — I8312 Varicose veins of left lower extremity with inflammation: Secondary | ICD-10-CM | POA: Diagnosis not present

## 2019-05-21 DIAGNOSIS — I8311 Varicose veins of right lower extremity with inflammation: Secondary | ICD-10-CM | POA: Diagnosis not present

## 2019-05-23 DIAGNOSIS — I8311 Varicose veins of right lower extremity with inflammation: Secondary | ICD-10-CM | POA: Diagnosis not present

## 2019-05-23 DIAGNOSIS — I8312 Varicose veins of left lower extremity with inflammation: Secondary | ICD-10-CM | POA: Diagnosis not present

## 2019-07-09 ENCOUNTER — Other Ambulatory Visit: Payer: Self-pay

## 2019-07-11 ENCOUNTER — Encounter: Payer: Self-pay | Admitting: Obstetrics & Gynecology

## 2019-07-11 ENCOUNTER — Other Ambulatory Visit: Payer: Self-pay

## 2019-07-11 ENCOUNTER — Ambulatory Visit: Payer: BC Managed Care – PPO | Admitting: Obstetrics & Gynecology

## 2019-07-11 VITALS — BP 100/70 | HR 64 | Temp 97.5°F | Ht 62.25 in | Wt 122.8 lb

## 2019-07-11 DIAGNOSIS — N898 Other specified noninflammatory disorders of vagina: Secondary | ICD-10-CM

## 2019-07-11 MED ORDER — NORETHIN ACE-ETH ESTRAD-FE 1-20 MG-MCG PO TABS: ORAL_TABLET | ORAL | 3 refills | Status: DC

## 2019-07-11 MED ORDER — FLUCONAZOLE 150 MG PO TABS
ORAL_TABLET | ORAL | 0 refills | Status: DC
Start: 2019-07-11 — End: 2020-04-26

## 2019-07-11 NOTE — Progress Notes (Signed)
GYNECOLOGY  VISIT  CC:   Vaginal itching   HPI: 28 y.o. G2P0111 Married White female.  She felt very hormonal with the Nuva ring.  She does not want to use any contraception at this time.  If gets pregnant, that would be ok.  Does use tampons with cycles but has vaginal itching after using for three to four days.  She's had this happen in the past but this typically resolves.    GYNECOLOGIC HISTORY: Patient's last menstrual period was 06/30/2019 (approximate). Contraception: none Menopausal hormone therapy: none  Patient Active Problem List   Diagnosis Date Noted  . Cesarean delivery 09/29/2018  . Postpartum care following cesarean delivery (11/24) 09/29/2018  . Complete placenta previa with hemorrhage, third trimester 09/10/2018  . Antepartum hemorrhage from placenta previa 08/20/2018  . Enterocolitis 03/28/2017  . Leukocytosis 03/28/2017  . Thrombocytosis ([place]) 03/28/2017  . Prolonged Q-T interval on ECG 03/28/2017  . Endometriosis 03/20/2017  . 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 Multinodular goiter 05/29/2016  . Allergic rhinitis   . Neck nodule 05/26/2016    Past Medical History:  Diagnosis Date  . Allergic rhinitis   . Allergy   . Asthma   . Endometriosis   . Thyroid nodule     Past Surgical History:  Procedure Laterality Date  . CESAREAN SECTION N/A 09/29/2018   Procedure: CESAREAN SECTION;  Surgeon: Brien Few, MD;  Location: [facility];  Service: Obstetrics;  Laterality: N/A;  . CHROMOPERTUBATION Bilateral 03/20/2017   Procedure: CHROMOPERTUBATION;  Surgeon: Megan Salon, MD;  Location: [facility];  Service: Gynecology;  Laterality: Bilateral;  Fallopian tubes  . CYSTOSCOPY N/A 03/20/2017   Procedure: CYSTOSCOPY;  Surgeon: Megan Salon, MD;  Location: [facility];  Service: Gynecology;  Laterality: N/A;  . FOOT SURGERY Left    neuromona  . LAPAROSCOPIC OVARIAN CYSTECTOMY Bilateral 03/20/2017   Procedure: LAPAROSCOPIC OVARIAN CYSTECTOMY;  Surgeon: Megan Salon, MD;  Location: [facility];  Service: Gynecology;  Laterality: Bilateral;  . LAPAROSCOPIC UNILATERAL SALPINGECTOMY Right 03/20/2017   Procedure: LAPAROSCOPIC UNILATERAL SALPINGECTOMY;  Surgeon: Megan Salon, MD;  Location: [facility];  Service: Gynecology;  Laterality: Right;  . LAPAROSCOPY Bilateral 03/20/2017   Procedure: LAPAROSCOPY OPERATIVE  WITH LYSIS OF ADHESIONS;  Surgeon: Megan Salon, MD;  Location: [facility];  Service: Gynecology;  Laterality: Bilateral;  . WISDOM TOOTH EXTRACTION  03/28/2013  . WISDOM TOOTH EXTRACTION      MEDS:   Current Outpatient Medications on File Prior to Visit  Medication Sig Dispense Refill  . acetaminophen (TYLENOL) 325 MG tablet Take 650 mg by mouth every 6 (six) hours as needed for moderate pain.    . diphenhydrAMINE (BENADRYL) 25 mg capsule Take 1 capsule (25 mg total) by mouth every 6 (six) hours as needed for itching. 30 capsule 0  . loratadine (CLARITIN) 10 MG tablet Take 10 mg by mouth daily.     No current facility-administered medications on file prior to visit.     ALLERGIES: Shellfish allergy, Augmentin [amoxicillin-pot clavulanate], Ciprofloxacin, and Clavulanic acid  Family History  Problem Relation Age of Onset  . Breast cancer Mother 58        mastectomy, negative genetic testing  . Thyroid disease Neg Hx   . Cancer Mother     SH:  Married, non smoker  Review of Systems  Genitourinary:       Vaginal itching   All other systems reviewed and are negative.   PHYSICAL EXAMINATION:  BP 100/70   Pulse 64   Temp (!) 97.5 F (36.4 C) (Temporal)   Ht 5' 2.25" (1.581 m)   Wt 122 lb 12.8 oz (55.7 kg)   LMP 06/30/2019 (Approximate)   BMI 22.28 kg/m     General appearance: alert, cooperative and appears stated age Lymph:  no inguinal LAD noted  Pelvic: External genitalia:  no lesions              Urethra:  normal appearing urethra with no masses, tenderness or lesions              Bartholins and Skenes: normal                  Vagina: normal appearing vagina with normal color, whitish vaginal discharge noted              Cervix: no lesions              Bimanual Exam:  Uterus:  normal size, contour, position, consistency, mobility, non-tender              Adnexa: no mass, fullness, tenderness  Chaperone was present for exam.  Assessment: Vaginal irritation possibly due to tampon use  Plan: Diflucan 150 mg po x 1, repeat in 72 hours. Affirm pending Rx for loestin 1/20 po q day, taking continuous active pills only.

## 2019-07-12 LAB — VAGINITIS/VAGINOSIS, DNA PROBE
Candida Species: NEGATIVE
Gardnerella vaginalis: POSITIVE — AB
Trichomonas vaginosis: NEGATIVE

## 2019-07-15 ENCOUNTER — Telehealth: Payer: Self-pay | Admitting: Obstetrics & Gynecology

## 2019-07-15 MED ORDER — METRONIDAZOLE 500 MG PO TABS: 500.0000 mg | ORAL_TABLET | Freq: Two times a day (BID) | ORAL | 0 refills | Status: DC

## 2019-07-15 NOTE — Telephone Encounter (Signed)
Viewed by Traci Sermon on 07/14/2019 1:39 PM Written by Megan Salon, MD on 07/13/2019 9:18 PM Jacqueline [name],  The vaginitis testing showed bacterial vaginosis and not yeast. I will need to send in a different prescription for you. Where do you want this sent? I'll prescribe the oral treatment for you. It is taken for 7 days. Just let me know. Thanks.   Edwinna Areola

## 2019-07-15 NOTE — Telephone Encounter (Signed)
Routed to Dr. Miller

## 2019-07-15 NOTE — Telephone Encounter (Signed)
She received a message to call with her current pharmacy information.   She does not know the name of the prescription. [facility] [place], [place], [place].

## 2019-08-06 DIAGNOSIS — I8312 Varicose veins of left lower extremity with inflammation: Secondary | ICD-10-CM | POA: Diagnosis not present

## 2019-08-06 DIAGNOSIS — I8311 Varicose veins of right lower extremity with inflammation: Secondary | ICD-10-CM | POA: Diagnosis not present

## 2019-09-10 DIAGNOSIS — I8311 Varicose veins of right lower extremity with inflammation: Secondary | ICD-10-CM | POA: Diagnosis not present

## 2019-09-11 DIAGNOSIS — I8311 Varicose veins of right lower extremity with inflammation: Secondary | ICD-10-CM | POA: Diagnosis not present

## 2019-09-17 DIAGNOSIS — I8312 Varicose veins of left lower extremity with inflammation: Secondary | ICD-10-CM | POA: Diagnosis not present

## 2019-09-24 DIAGNOSIS — I8312 Varicose veins of left lower extremity with inflammation: Secondary | ICD-10-CM | POA: Diagnosis not present

## 2019-09-24 DIAGNOSIS — I8311 Varicose veins of right lower extremity with inflammation: Secondary | ICD-10-CM | POA: Diagnosis not present

## 2019-10-28 DIAGNOSIS — I8311 Varicose veins of right lower extremity with inflammation: Secondary | ICD-10-CM | POA: Diagnosis not present

## 2019-11-04 DIAGNOSIS — I8312 Varicose veins of left lower extremity with inflammation: Secondary | ICD-10-CM | POA: Diagnosis not present

## 2019-11-05 DIAGNOSIS — I8311 Varicose veins of right lower extremity with inflammation: Secondary | ICD-10-CM | POA: Diagnosis not present

## 2019-11-05 DIAGNOSIS — M7981 Nontraumatic hematoma of soft tissue: Secondary | ICD-10-CM | POA: Diagnosis not present

## 2019-11-17 ENCOUNTER — Telehealth: Payer: Self-pay | Admitting: Obstetrics & Gynecology

## 2019-11-17 NOTE — Telephone Encounter (Signed)
Spoke with patient, states now is not a good time to talk, she will return call to triage later today.

## 2019-11-17 NOTE — Telephone Encounter (Signed)
Patient is returning a call to Jill. °

## 2019-11-17 NOTE — Telephone Encounter (Signed)
Spoke with patient. Has been on Loestrin Fe continuous active since 07/2019. Previously on Nuvaring. Reports increased spotting with OCP, especially over the last month. Denies late or missed pills, takes in the mornings. Denies any other GYN symptoms. UPT neg 1.5 wks ago.   Took her last pill yesterday, does not plan to restart new pack. Menses started today. Patient asking if she should allow for menses and the try OCP again? Or switch OCP? Has discussed IUD and nexplanon in the past, not interested in theses options, would like to stay on OCP. Patient is agreeable to OV/MyChart, if needed. Advised I will review with Dr. Sabra Heck and f/u with recommendations. Patient agreeable. Pharmacy confirmed.   Dr. Sabra Heck -please advise.

## 2019-11-17 NOTE — Telephone Encounter (Signed)
Patient is calling regarding OCP. Patient stated that she was spotting through the entire last month. Patient stated that she typically skips the placebo and starts taking her next pack without a cycle. Patient stated that since she was spotting through her entire last pack, she decided to not start her new pack. Patient stated that she started her menses today. Patient is wondering if she needs to change birth control method or continue with current OCP.

## 2019-11-18 NOTE — Telephone Encounter (Signed)
She should have a regular 7 day off cycle and then restart the OCP again to see if this helps the spotting.  If it does not, then we can talk about other options.  Thanks.

## 2019-11-18 NOTE — Telephone Encounter (Signed)
Spoke with patient, advised per Dr. Miller. Patient verbalizes understanding and is agreeable. Encounter closed.  

## 2019-11-19 DIAGNOSIS — I8312 Varicose veins of left lower extremity with inflammation: Secondary | ICD-10-CM | POA: Diagnosis not present

## 2019-12-15 ENCOUNTER — Other Ambulatory Visit: Payer: Self-pay

## 2019-12-16 ENCOUNTER — Ambulatory Visit: Payer: BC Managed Care – PPO | Admitting: Certified Nurse Midwife

## 2019-12-17 DIAGNOSIS — B373 Candidiasis of vulva and vagina: Secondary | ICD-10-CM | POA: Diagnosis not present

## 2019-12-22 DIAGNOSIS — M7981 Nontraumatic hematoma of soft tissue: Secondary | ICD-10-CM | POA: Diagnosis not present

## 2020-01-19 ENCOUNTER — Encounter: Payer: Self-pay | Admitting: Certified Nurse Midwife

## 2020-01-21 ENCOUNTER — Encounter: Payer: Self-pay | Admitting: Certified Nurse Midwife

## 2020-01-27 ENCOUNTER — Encounter: Payer: Self-pay | Admitting: Obstetrics & Gynecology

## 2020-01-27 ENCOUNTER — Telehealth: Payer: Self-pay

## 2020-01-27 NOTE — Telephone Encounter (Signed)
Jacqueline [name]  Megan Salon, MD 15 minutes ago (2:38 PM)   Dr. Sabra Heck, I am finding the pill is just not going to work for me! I have tried my hardest to remember to take it at the same time every day (use an alarm, etc.) and that one time I forget, I spot the rest of the month. I had a period after spotting all of last month and ended up developing yeast (I wore pads for the entire period so I'm not sure how I end up with some type of infection every time). Besides IUD, is there another option? Is there a vaginal ring with less hormones?   Pt sent above mychart message.   Routing to Dr Sabra Heck for recommendations and advice.

## 2020-01-28 NOTE — Telephone Encounter (Signed)
Left message to call Quinta Eimer, RN at [facility] [phone].   

## 2020-01-28 NOTE — Telephone Encounter (Signed)
She has used Nuva ring in the past but had vaginal dryness.  There is no lower dosed ring.  However, we could consider using some vaginal estrogen cream with the ring to help with the dryness if she would consider that option.

## 2020-01-30 NOTE — Telephone Encounter (Signed)
Yes, Depo Provera is an option and would likely help her hx of endometriosis as well.  She just needs to be aware she could have some irregular bleeding.  This usually resolves with the second injection but not always.    She would just placed the cream vaginally with the ring in place, if she started experiencing vaginal dryness.

## 2020-01-30 NOTE — Telephone Encounter (Signed)
Spoke to pt. Pt given updated recommendations and advice per Dr Sabra Heck. Pt agreeable to start Depo. Pt will call back and schedule with the start of next cycle. Pt verbalized understanding.   Routing to Dr Sabra Heck for review.

## 2020-01-30 NOTE — Telephone Encounter (Signed)
Spoke to pt. Pt given recommendations. Pt states had mood swings with Nuva ring, but is wanting to know:  1: How to use nuvaring with vaginal estrogen cream?  2: would Depo Provera be an option?   Pt states does not want to do nexplanon.   Routing to Dr Sabra Heck for review and advice. Will return call to pt once have updated recommendations. Pt agreeable.

## 2020-02-03 DIAGNOSIS — N760 Acute vaginitis: Secondary | ICD-10-CM | POA: Diagnosis not present

## 2020-02-03 DIAGNOSIS — R350 Frequency of micturition: Secondary | ICD-10-CM | POA: Diagnosis not present

## 2020-03-17 DIAGNOSIS — N760 Acute vaginitis: Secondary | ICD-10-CM | POA: Diagnosis not present

## 2020-03-17 DIAGNOSIS — Z118 Encounter for screening for other infectious and parasitic diseases: Secondary | ICD-10-CM | POA: Diagnosis not present

## 2020-03-24 DIAGNOSIS — L292 Pruritus vulvae: Secondary | ICD-10-CM | POA: Diagnosis not present

## 2020-04-06 DIAGNOSIS — Z8616 Personal history of COVID-19: Secondary | ICD-10-CM

## 2020-04-06 DIAGNOSIS — N9089 Other specified noninflammatory disorders of vulva and perineum: Secondary | ICD-10-CM | POA: Diagnosis not present

## 2020-04-06 DIAGNOSIS — B373 Candidiasis of vulva and vagina: Secondary | ICD-10-CM | POA: Diagnosis not present

## 2020-04-06 HISTORY — DX: Personal history of COVID-19: Z86.16

## 2020-04-07 ENCOUNTER — Encounter: Payer: Self-pay | Admitting: Obstetrics & Gynecology

## 2020-04-08 ENCOUNTER — Other Ambulatory Visit: Payer: Self-pay

## 2020-04-08 ENCOUNTER — Ambulatory Visit: Payer: BC Managed Care – PPO | Admitting: Obstetrics & Gynecology

## 2020-04-08 ENCOUNTER — Encounter: Payer: Self-pay | Admitting: Obstetrics & Gynecology

## 2020-04-08 ENCOUNTER — Telehealth: Payer: Self-pay | Admitting: Obstetrics & Gynecology

## 2020-04-08 VITALS — BP 110/70 | HR 68 | Temp 97.2°F | Resp 16 | Wt 121.0 lb

## 2020-04-08 DIAGNOSIS — N9089 Other specified noninflammatory disorders of vulva and perineum: Secondary | ICD-10-CM | POA: Diagnosis not present

## 2020-04-08 MED ORDER — TRIAMCINOLONE ACETONIDE 0.5 % EX OINT
1.0000 | TOPICAL_OINTMENT | Freq: Two times a day (BID) | CUTANEOUS | 0 refills | Status: DC
Start: 2020-04-08 — End: 2020-05-24

## 2020-04-08 NOTE — Telephone Encounter (Signed)
Non-Urgent Medical Question Received: Yesterday Message Contents  Jacqueline [name], Jacqueline "[name]" sent to [facility]  Phone Number: [phone]  Dr. Sabra Heck, I went to [facility] for what I thought was a yeast infection in February. I have been prescribed everything below and have listed my symptoms. I am going to call tomorrow to see if I can be squeezed in for a pap or swab. I know you may not be able to see me, but even if I can get a swab sent to the lab and you can take a look at it, it would be helpful.   Diflucan (2/10 and 6/1)  Tinidazole (3/30)  Boric acid (3/30)  Metronidazole (4/13)  Clindamycin (5/12)  Lidocaine 5% ointment   Negative gonorrhea, chlamydia, trich   Symptoms: Began as mild itch (I usually experience an itch after I have used the bathroom and wiped, or it will happen randomly but not bad enough that i feel the need to scratch), Changes in discharge (color is yellowish white and thick, often comes out in large amounts and is sometimes super stringy), Extreme sensitivity and pain on the Vulva (has gotten to the point I cannot wear jeans/shorts/ anything with a crotch), redness and swelling, burning pain.   I am getting very discouraged and upset feeling like this can't be fixed.. if anything it has gotten worse. I will call tomorrow but was just hoping you would see this!

## 2020-04-08 NOTE — Progress Notes (Addendum)
GYNECOLOGY  VISIT  CC:   Vulvar irritation  HPI: 29 y.o. G22P0111 Married Caucasian female here for vaginal discharge that has been intermittently present since February.  She's been seen at least three times at [facility] ob[facility]gyn and been alternatively treated for yeast/BV.  Has been treated with diflucan twice, metronidazole once, clindamycin and tinidazole.  She was tested GC/chl/trich on 03/17/2020.  I reviewed this via the EMR portal on pt's phone.  Most recently, she was treated with topical lidocaine to keep from feeling irritated.  Reports she has more symptoms with pants or tighter clothes.  Is using always mini pads almost every day.  Uses dove sensitive soap.  GYNECOLOGIC HISTORY: Patient's last menstrual period was 03/26/2020 (exact date). Contraception: none Menopausal hormone therapy: none  Patient Active Problem List   Diagnosis Date Noted  . Cesarean delivery 09/29/2018  . Postpartum care following cesarean delivery (11/24) 09/29/2018  . Complete placenta previa with hemorrhage, third trimester 09/10/2018  . Antepartum hemorrhage from placenta previa 08/20/2018  . Enterocolitis 03/28/2017  . Leukocytosis 03/28/2017  . Thrombocytosis ([place]) 03/28/2017  . Prolonged Q-T interval on ECG 03/28/2017  . Endometriosis 03/20/2017  . 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 Multinodular goiter 05/29/2016  . Allergic rhinitis   . Neck nodule 05/26/2016    Past Medical History:  Diagnosis Date  . Allergic rhinitis   . Allergy   . Asthma   . Endometriosis   . Thyroid nodule     Past Surgical History:  Procedure Laterality Date  . CESAREAN SECTION N/A 09/29/2018   Procedure: CESAREAN SECTION;  Surgeon: Brien Few, MD;  Location: [facility];  Service: Obstetrics;  Laterality: N/A;  . CHROMOPERTUBATION Bilateral 03/20/2017   Procedure: CHROMOPERTUBATION;  Surgeon: Megan Salon, MD;  Location: [facility];  Service: Gynecology;  Laterality: Bilateral;  Fallopian tubes  .  CYSTOSCOPY N/A 03/20/2017   Procedure: CYSTOSCOPY;  Surgeon: Megan Salon, MD;  Location: [facility];  Service: Gynecology;  Laterality: N/A;  . FOOT SURGERY Left    neuromona  . LAPAROSCOPIC OVARIAN CYSTECTOMY Bilateral 03/20/2017   Procedure: LAPAROSCOPIC OVARIAN CYSTECTOMY;  Surgeon: Megan Salon, MD;  Location: [facility];  Service: Gynecology;  Laterality: Bilateral;  . LAPAROSCOPIC UNILATERAL SALPINGECTOMY Right 03/20/2017   Procedure: LAPAROSCOPIC UNILATERAL SALPINGECTOMY;  Surgeon: Megan Salon, MD;  Location: [facility];  Service: Gynecology;  Laterality: Right;  . LAPAROSCOPY Bilateral 03/20/2017   Procedure: LAPAROSCOPY OPERATIVE  WITH LYSIS OF ADHESIONS;  Surgeon: Megan Salon, MD;  Location: [facility];  Service: Gynecology;  Laterality: Bilateral;  . WISDOM TOOTH EXTRACTION  03/28/2013  . WISDOM TOOTH EXTRACTION      MEDS:   Current Outpatient Medications on File Prior to Visit  Medication Sig Dispense Refill  . acetaminophen (TYLENOL) 325 MG tablet Take 650 mg by mouth every 6 (six) hours as needed for moderate pain.    . diphenhydrAMINE (BENADRYL) 25 mg capsule Take 1 capsule (25 mg total) by mouth every 6 (six) hours as needed for itching. 30 capsule 0  . fluconazole (DIFLUCAN) 150 MG tablet Take 1 tab po x 1, repeat in 72 hours. 2 tablet 0  . lidocaine (XYLOCAINE) 5 % ointment     . loratadine (CLARITIN) 10 MG tablet Take 10 mg by mouth daily.    . Probiotic Product (PROBIOTIC PO) Take by mouth.     No current facility-administered medications on file prior to visit.    ALLERGIES: Shellfish allergy, Augmentin [amoxicillin-pot clavulanate], Ciprofloxacin, and Clavulanic  acid  Family History  Problem Relation Age of Onset  . Breast cancer Mother 48        mastectomy, negative genetic testing  . Thyroid disease Neg Hx   . Cancer Mother     SH:  Married, non smoker  Review of Systems  Constitutional: Negative.   HENT: Negative.   Eyes: Negative.   Respiratory: Negative.    Cardiovascular: Negative.   Gastrointestinal: Negative.   Endocrine: Negative.   Genitourinary: Positive for vaginal discharge.       Slight itching, irritation & swelling  Musculoskeletal: Negative.   Skin: Negative.   Allergic/Immunologic: Negative.   Neurological: Negative.   Hematological: Negative.   Psychiatric/Behavioral: Negative.     PHYSICAL EXAMINATION:    BP 110/70   Pulse 68   Temp (!) 97.2 F (36.2 C) (Skin)   Resp 16   Wt 121 lb (54.9 kg)   LMP 03/26/2020 (Exact Date)   BMI 21.95 kg/m     General appearance: alert, cooperative and appears stated age Lymph:  no inguinal LAD noted  Pelvic: External genitalia:  no lesions but significant peri-clitoral erythema               Urethra:  normal appearing urethra with no masses, tenderness or lesions              Bartholins and Skenes: normal                 Vagina: normal appearing vagina with normal color and discharge, no lesions              Cervix: no lesions              Bimanual Exam:  Uterus:  normal size, contour, position, consistency, mobility, non-tender              Adnexa: no mass, fullness, tenderness  Chaperone, Terence Lux, CMA, was present for exam.  Assessment: Vulvar irritation with multiple treatments for yeast and BV in another obgyn office  Plan: Affirm swab for BV/yeast obtained.  Doubtful of this now. Topical triamcinolone 0.5% bid while traveling to [place].  Leaves tomorrow. Skin culture obtained as well today.

## 2020-04-08 NOTE — Telephone Encounter (Signed)
Spoke with pt. Pt states having either BV or yeast sx since Feb 2021. Pt has described this in detail on mychart message. Pt denies fever, chills, abd pain, N/V/D. Pt states has progressively gotten worse and no treatment has helped. Pt saw [facility] in Feb just to have a quicker appt.  Pt has taken a list of medications. (See mychart message)  Pt requesting to be seen for further evaluation. Offered appt on Friday, but pt states will be out of town.  Pt scheduled as work-in appt today at 1:45pm. Pt agreeable and verbalized understanding. CPS neg.   Routing to Dr Sabra Heck for review.  Encounter closed.

## 2020-04-09 DIAGNOSIS — N9089 Other specified noninflammatory disorders of vulva and perineum: Secondary | ICD-10-CM | POA: Diagnosis not present

## 2020-04-09 NOTE — Addendum Note (Signed)
Addended by: Megan Salon on: 04/09/2020 10:48 AM   Modules accepted: Orders

## 2020-04-10 LAB — NUSWAB BV AND CANDIDA, NAA
Candida albicans, NAA: NEGATIVE
Candida glabrata, NAA: NEGATIVE

## 2020-04-12 ENCOUNTER — Telehealth: Payer: Self-pay | Admitting: *Deleted

## 2020-04-12 MED ORDER — SULFAMETHOXAZOLE-TRIMETHOPRIM 800-160 MG PO TABS
1.0000 | ORAL_TABLET | Freq: Two times a day (BID) | ORAL | 0 refills | Status: AC
Start: 2020-04-12 — End: 2020-04-19

## 2020-04-12 NOTE — Telephone Encounter (Addendum)
Patient returned call in regard to 04/09/20 wound culture results and recommendations. Patient is requesting to go ahead and start Bactrim DS 1 PO bid x7 days. She will be returning from [place] later today, [facility]. Patient reports symptoms have improved with use of Kenalog cream. No burning. Has not tried to wear shorts.   Patient will be traveling to the [facility] on 04/16/20, will return on 04/25/20.   OV scheduled for 04/26/20 at 4:30pm with Dr. Sabra Heck. Advised patient I will provide update to Dr. Sabra Heck, will return call if earlier OV is needed. Patient verbalizes understanding and is agreeable.    Dr. Sabra Heck -ok to wait until 6/21 for OV? Or would you prefer to see her before she travels again?

## 2020-04-14 NOTE — Telephone Encounter (Signed)
Left detailed message per DPR. Pt given update and recommendations per Dr Sabra Heck. Pt to return call to office with any questions or concerns.  Encounter closed.

## 2020-04-14 NOTE — Telephone Encounter (Signed)
Ok to wait until 6/21 for OV.  I don't think bactrim has any sun sensitivity risk but advise her to make sure to check with pharmacist so she will know if needs to be more careful with sunscreen.

## 2020-04-15 LAB — WOUND CULTURE

## 2020-04-17 ENCOUNTER — Encounter: Payer: Self-pay | Admitting: Obstetrics & Gynecology

## 2020-04-19 ENCOUNTER — Telehealth: Payer: Self-pay | Admitting: Obstetrics & Gynecology

## 2020-04-19 DIAGNOSIS — N9089 Other specified noninflammatory disorders of vulva and perineum: Secondary | ICD-10-CM

## 2020-04-19 NOTE — Telephone Encounter (Signed)
Left message to call Jill, RN at [facility] [phone].   

## 2020-04-19 NOTE — Telephone Encounter (Signed)
Spoke with patient. Questions answered about E. Coli, patient will plan to discuss further when she is in the office for f/u on 6/21.   She will take her last abx today. Is still using Kenalog ointment bid. Reports itch has improved, not resolved. Redness initially resolved, returned a couple of days ago. Reports sensitivity is present when she tries to wear clothing. denies any new symptoms.   Patient is currently in the [facility], will return on Sunday, asking if there is anything different she should try? Or new Rx needed?   Advised to complete current abx. Continue kenalog ointment as prescribed. Keep OV as scheduled for 6/21. Will provide update to Dr. Sabra Heck, our office will return call if any additional recommendations.   Routing to Dr. Sabra Heck

## 2020-04-19 NOTE — Telephone Encounter (Signed)
Question regarding CULTURE, WOUND Received: 2 days ago Jacqueline [name], Jacqueline "[name]" sent to [facility] Dr. Sabra Heck,  I was just curious how the infection is E.Coli? I guess I mean how I would get that? Is it strictly that poop got up there? Haha.. I, of course, googled and saw something about constantly wearing pads, or poor hygiene (but I literally shower twice a day).. Anddd I worry so I want to be sure this wouldnt have anything to do with my endo?  Also, I have 2 days left of the antibiotic but I am not seeing a big difference yet and still have a noticeable itch. The steroid cream has helped a lot, but the sensitivity is still there if I try to wear shorts or something. I can go to a pharmacy down here if you want to try anything else but I should live until Monday!  Thank you,  [name]

## 2020-04-21 NOTE — Telephone Encounter (Signed)
Spoke with patient, advised as seen below per Dr. Miller. Patient verbalizes understanding and is agreeable.   

## 2020-04-21 NOTE — Telephone Encounter (Signed)
I do not have any new recommendations but I will probably do a skin biopsy when she comes for follow up since this hasn't resolved.  Thanks.

## 2020-04-21 NOTE — Telephone Encounter (Signed)
Order placed for vulvar biopsy. Dx: vulvar irritation  Routing to provider for final review. Patient is agreeable to disposition. Will close encounter.  Cc: Wilson Singer, Magdalene Patricia

## 2020-04-22 ENCOUNTER — Telehealth: Payer: Self-pay | Admitting: Obstetrics & Gynecology

## 2020-04-22 NOTE — Telephone Encounter (Signed)
Patient returned my call and I conveyed the benefits. Patient understands/agreeable with the benefits. Patient is aware of the cancellation policy. Appointment scheduled 04/26/20.

## 2020-04-22 NOTE — Telephone Encounter (Signed)
Cal placed to convey benefits for 04/26/20 scheduled appointment.

## 2020-04-23 NOTE — Progress Notes (Signed)
GYNECOLOGY  VISIT  CC:   Vulvar irritation  HPI: 29 y.o. G2P0111 Married white female here for follow up & possible vulvar biopsy.  Reports vulvar irritation is improved.  Initially steroid helped but she really thinks the antibiotic is what helped the most.  Is able to wear shorts as long as they are not tight.  This is an improvement.  Still feels like she has some redness.  Denies any itching.  Pt really does not want a vulvar biopsy today.  She also reports she and spouse are considering embryo transfer in August so hopes vulvar issues are resolved by that time.   GYNECOLOGIC HISTORY: Patient's last menstrual period was 04/24/2020 (exact date). Contraception: none Menopausal hormone therapy: none  Patient Active Problem List   Diagnosis Date Noted  . Cesarean delivery 09/29/2018  . Enterocolitis 03/28/2017  . Leukocytosis 03/28/2017  . Thrombocytosis ([place]) 03/28/2017  . Prolonged Q-T interval on ECG 03/28/2017  . Endometriosis 03/20/2017  . 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 Multinodular goiter 05/29/2016  . Allergic rhinitis   . Neck nodule 05/26/2016    Past Medical History:  Diagnosis Date  . Allergic rhinitis   . Allergy   . Asthma   . Endometriosis   . Thyroid nodule     Past Surgical History:  Procedure Laterality Date  . CESAREAN SECTION N/A 09/29/2018   Procedure: CESAREAN SECTION;  Surgeon: Brien Few, MD;  Location: [facility];  Service: Obstetrics;  Laterality: N/A;  . CHROMOPERTUBATION Bilateral 03/20/2017   Procedure: CHROMOPERTUBATION;  Surgeon: Megan Salon, MD;  Location: [facility];  Service: Gynecology;  Laterality: Bilateral;  Fallopian tubes  . CYSTOSCOPY N/A 03/20/2017   Procedure: CYSTOSCOPY;  Surgeon: Megan Salon, MD;  Location: [facility];  Service: Gynecology;  Laterality: N/A;  . FOOT SURGERY Left    neuromona  . LAPAROSCOPIC OVARIAN CYSTECTOMY Bilateral 03/20/2017   Procedure: LAPAROSCOPIC OVARIAN CYSTECTOMY;  Surgeon: Megan Salon, MD;  Location: [facility];  Service: Gynecology;  Laterality: Bilateral;  . LAPAROSCOPIC UNILATERAL SALPINGECTOMY Right 03/20/2017   Procedure: LAPAROSCOPIC UNILATERAL SALPINGECTOMY;  Surgeon: Megan Salon, MD;  Location: [facility];  Service: Gynecology;  Laterality: Right;  . LAPAROSCOPY Bilateral 03/20/2017   Procedure: LAPAROSCOPY OPERATIVE  WITH LYSIS OF ADHESIONS;  Surgeon: Megan Salon, MD;  Location: [facility];  Service: Gynecology;  Laterality: Bilateral;  . WISDOM TOOTH EXTRACTION  03/28/2013  . WISDOM TOOTH EXTRACTION      MEDS:   Current Outpatient Medications on File Prior to Visit  Medication Sig Dispense Refill  . acetaminophen (TYLENOL) 325 MG tablet Take 650 mg by mouth every 6 (six) hours as needed for moderate pain.    . diphenhydrAMINE (BENADRYL) 25 mg capsule Take 1 capsule (25 mg total) by mouth every 6 (six) hours as needed for itching. 30 capsule 0  . loratadine (CLARITIN) 10 MG tablet Take 10 mg by mouth daily.    Marland Kitchen triamcinolone ointment (KENALOG) 0.5 % Apply 1 application topically 2 (two) times daily. (Patient not taking: Reported on 04/26/2020) 30 g 0   No current facility-administered medications on file prior to visit.    ALLERGIES: Shellfish allergy, Augmentin [amoxicillin-pot clavulanate], Ciprofloxacin, and Clavulanic acid  Family History  Problem Relation Age of Onset  . Breast cancer Mother 58        mastectomy, negative genetic testing  . Thyroid disease Neg Hx   . Cancer Mother     SH:  Married, non smoker  Review  of Systems  Constitutional: Negative.   HENT: Negative.   Eyes: Negative.   Respiratory: Negative.   Cardiovascular: Negative.   Gastrointestinal: Negative.   Endocrine: Negative.   Genitourinary: Negative.   Musculoskeletal: Negative.   Skin: Negative.   Allergic/Immunologic: Negative.   Neurological: Negative.   Hematological: Negative.   Psychiatric/Behavioral: Negative.     PHYSICAL EXAMINATION:    Wt 123 lb (55.8 kg)    LMP 04/24/2020 (Exact Date)   BMI 22.32 kg/m     General appearance: alert, cooperative and appears stated age Lymph:  no inguinal LAD noted  Pelvic: External genitalia:  no lesions but external erythema on labia majora, inner and exterior as well as labia minora noted today.  Repeat skin culture obtained              Urethra:  normal appearing urethra with no masses, tenderness or lesions              Bartholins and Skenes: normal                 Vagina: normal appearing vagina with normal color and discharge, no lesions              Assessment: Vulvar irritation with h/o e coli positive skin culture  Plan: Repeat culture obtained today.  If this is negative, pt willing to return for vulvar biopsy. If positive, will retreat with antibiotics.  Pt comfortable with plan.

## 2020-04-26 ENCOUNTER — Other Ambulatory Visit: Payer: Self-pay

## 2020-04-26 ENCOUNTER — Encounter: Payer: Self-pay | Admitting: Obstetrics & Gynecology

## 2020-04-26 ENCOUNTER — Ambulatory Visit (INDEPENDENT_AMBULATORY_CARE_PROVIDER_SITE_OTHER): Payer: BC Managed Care – PPO | Admitting: Obstetrics & Gynecology

## 2020-04-26 DIAGNOSIS — N9089 Other specified noninflammatory disorders of vulva and perineum: Secondary | ICD-10-CM

## 2020-04-29 ENCOUNTER — Telehealth: Payer: Self-pay

## 2020-04-29 DIAGNOSIS — N898 Other specified noninflammatory disorders of vagina: Secondary | ICD-10-CM

## 2020-04-29 DIAGNOSIS — N9089 Other specified noninflammatory disorders of vulva and perineum: Secondary | ICD-10-CM

## 2020-04-29 LAB — WOUND CULTURE

## 2020-04-29 NOTE — Telephone Encounter (Signed)
Patient is calling in regards to still experiencing itching. Patient would also like to schedule biopsy.

## 2020-04-29 NOTE — Telephone Encounter (Signed)
Dr. Sabra Heck -please review 04/26/20 wound culture results and advise.

## 2020-04-30 MED ORDER — ESTRADIOL 0.1 MG/GM VA CREA
TOPICAL_CREAM | VAGINAL | 0 refills | Status: DC
Start: 2020-04-30 — End: 2020-07-29

## 2020-04-30 NOTE — Telephone Encounter (Signed)
[place], CMA  04/30/2020 9:12 AM EDT Back to Top    Patient notified of results as written by provider & would like for you to send estrogen cream to pharmacy. 3week recheck scheduled.   Megan Salon, MD  04/30/2020 6:52 AM EDT     Please let pt know the skin culture now is growing normal skin bacteria. There was "heavy growth" which means there is a lot of bacteria present but it is normal skin bacteria. She should start using cetaphil soap for washing. We discussed possibly using some topical estrogen cream as well before doing a skin biopsy. If ok with this, I will send rx for estrace cream that I will have her apply externally twice weekly and recheck again in 3 weeks. Thanks.

## 2020-04-30 NOTE — Telephone Encounter (Signed)
Reviewed with Dr. Sabra Heck.   Spoke with patient. Reports external vaginal itching, denies any other symptoms. Patient agreeable to vaginal estrogen cream. Rx for vaginal estrogen cream, apply pea size amount externally to vulva 2-3 times per week. OK to proceed with vulvar biopsy at recheck. OV rescheduled to 05/24/20 at 8:30am with Dr. Sabra Heck. Patient verbalizes understanding and is agreeable.   Order placed for precert.   Routing to provider for final review. Patient is agreeable to disposition. Will close encounter.  Cc: Magdalene Patricia, Hayley Carder

## 2020-05-05 ENCOUNTER — Telehealth: Payer: Self-pay | Admitting: Obstetrics & Gynecology

## 2020-05-05 NOTE — Telephone Encounter (Signed)
Call placed to convey benefits. Spoke with the patient and conveyed the benefits. Patient understands/agreeable with the benefits. Patient is aware of the cancellation policy. Appointment scheduled 05/24/20

## 2020-05-12 DIAGNOSIS — N971 Female infertility of tubal origin: Secondary | ICD-10-CM | POA: Diagnosis not present

## 2020-05-12 DIAGNOSIS — N802 Endometriosis of fallopian tube: Secondary | ICD-10-CM | POA: Diagnosis not present

## 2020-05-12 DIAGNOSIS — Z319 Encounter for procreative management, unspecified: Secondary | ICD-10-CM | POA: Diagnosis not present

## 2020-05-12 DIAGNOSIS — N801 Endometriosis of ovary: Secondary | ICD-10-CM | POA: Diagnosis not present

## 2020-05-12 DIAGNOSIS — Z3200 Encounter for pregnancy test, result unknown: Secondary | ICD-10-CM | POA: Diagnosis not present

## 2020-05-18 NOTE — Progress Notes (Signed)
GYNECOLOGY  VISIT  CC:   Recheck and vulvar biopsy  HPI: 29 y.o. G2P0111 Married White female here for 3 week follow up.  Skin sensitivity is basically resolved now.  She is only using topical estrogen cream at this point.  She is only using this two to three times.  Sometimes she does notice a skin itch after her cycle.  She isn't actually sure that this is new but she has taken notice.  She feels she is pretty much back to normal.    GYNECOLOGIC HISTORY: Patient's last menstrual period was 05/23/2020 (exact date). Contraception: none Menopausal hormone therapy: estrogen vaginal cream  Patient Active Problem List   Diagnosis Date Noted  . Personal history of covid-19 04/06/2020  . Cesarean delivery 09/29/2018  . Leukocytosis 03/28/2017  . Thrombocytosis ([place]) 03/28/2017  . Prolonged Q-T interval on ECG 03/28/2017  . Endometriosis 03/20/2017  . 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 Multinodular goiter 05/29/2016  . Allergic rhinitis     Past Medical History:  Diagnosis Date  . Allergic rhinitis   . Allergy   . Asthma   . Endometriosis   . Thyroid nodule     Past Surgical History:  Procedure Laterality Date  . CESAREAN SECTION N/A 09/29/2018   Procedure: CESAREAN SECTION;  Surgeon: Brien Few, MD;  Location: [facility];  Service: Obstetrics;  Laterality: N/A;  . CHROMOPERTUBATION Bilateral 03/20/2017   Procedure: CHROMOPERTUBATION;  Surgeon: Megan Salon, MD;  Location: [facility];  Service: Gynecology;  Laterality: Bilateral;  Fallopian tubes  . CYSTOSCOPY N/A 03/20/2017   Procedure: CYSTOSCOPY;  Surgeon: Megan Salon, MD;  Location: [facility];  Service: Gynecology;  Laterality: N/A;  . FOOT SURGERY Left    neuromona  . LAPAROSCOPIC OVARIAN CYSTECTOMY Bilateral 03/20/2017   Procedure: LAPAROSCOPIC OVARIAN CYSTECTOMY;  Surgeon: Megan Salon, MD;  Location: [facility];  Service: Gynecology;  Laterality: Bilateral;  . LAPAROSCOPIC UNILATERAL SALPINGECTOMY Right 03/20/2017    Procedure: LAPAROSCOPIC UNILATERAL SALPINGECTOMY;  Surgeon: Megan Salon, MD;  Location: [facility];  Service: Gynecology;  Laterality: Right;  . LAPAROSCOPY Bilateral 03/20/2017   Procedure: LAPAROSCOPY OPERATIVE  WITH LYSIS OF ADHESIONS;  Surgeon: Megan Salon, MD;  Location: [facility];  Service: Gynecology;  Laterality: Bilateral;  . WISDOM TOOTH EXTRACTION  03/28/2013  . WISDOM TOOTH EXTRACTION      MEDS:   Current Outpatient Medications on File Prior to Visit  Medication Sig Dispense Refill  . acetaminophen (TYLENOL) 325 MG tablet Take 650 mg by mouth every 6 (six) hours as needed for moderate pain.    . diphenhydrAMINE (BENADRYL) 25 mg capsule Take 1 capsule (25 mg total) by mouth every 6 (six) hours as needed for itching. 30 capsule 0  . estradiol (ESTRACE VAGINAL) 0.1 MG/GM vaginal cream Apply pea size amount externally to vulva 2-3 times per week. 42.5 g 0  . loratadine (CLARITIN) 10 MG tablet Take 10 mg by mouth daily.    . norethindrone (AYGESTIN) 5 MG tablet Take 5 mg by mouth daily. (Patient not taking: Reported on 05/24/2020)     No current facility-administered medications on file prior to visit.    ALLERGIES: Shellfish allergy, Augmentin [amoxicillin-pot clavulanate], Ciprofloxacin, and Clavulanic acid  Family History  Problem Relation Age of Onset  . Breast cancer Mother 1        mastectomy, negative genetic testing  . Thyroid disease Neg Hx   . Cancer Mother     SH:  Married, non smoker  Review of Systems  Constitutional: Negative.   HENT: Negative.   Eyes: Negative.   Respiratory: Negative.   Cardiovascular: Negative.   Gastrointestinal: Negative.   Endocrine: Negative.   Genitourinary: Negative.   Musculoskeletal: Negative.   Skin: Negative.   Allergic/Immunologic: Negative.   Neurological: Negative.   Hematological: Negative.   Psychiatric/Behavioral: Negative.     PHYSICAL EXAMINATION:    BP 110/64   Pulse 68   Resp 16   Wt 126 lb (57.2 kg)    LMP 05/23/2020 (Exact Date)   BMI 22.86 kg/m     General appearance: alert, cooperative and appears stated age Lymph:  no inguinal LAD noted  Pelvic: External genitalia:  no lesions, erythema has resolved, no specific lesions noted              Do not feel like she needs a skin biopsy at this time  Chaperone, Terence Lux, CMA, was present for exam.  Assessment: Vulvar irritation that I feel was due to e coli positive skin culture  Plan: Do not feel vulvar biopsy is needed today.  Pt is comfortable with this today. Topical products discussed that she can use   20 minutes total spent with pt today

## 2020-05-19 ENCOUNTER — Telehealth: Payer: Self-pay

## 2020-05-19 NOTE — Telephone Encounter (Signed)
Spoke with pt. Pt states wanting to know if can go back to using tampax sport tampons? Pt states has tried using organic tampons from [employer], pads and still having vuvlar itching for about 2 days at end of every cycle. Pt has hx of e-coli positive skin culture 04/2020.  Pt advised ok to use tampax sport tampons and to stick with brand tampons at this time. Pt agreeable.  Pt advised to have further discussion with Dr Sabra Heck at vulvar biopsy on Monday 05/24/20 at 830 am. Pt agreeable and verbalized understanding.   Routing to Dr Sabra Heck for review.  Encounter closed.

## 2020-05-19 NOTE — Telephone Encounter (Signed)
Patient is calling in regards to a recommendation for tampons. Patient stated she Is having issues with current tampons.

## 2020-05-20 ENCOUNTER — Ambulatory Visit: Payer: BC Managed Care – PPO | Admitting: Obstetrics & Gynecology

## 2020-05-24 ENCOUNTER — Other Ambulatory Visit: Payer: Self-pay

## 2020-05-24 ENCOUNTER — Encounter: Payer: Self-pay | Admitting: Obstetrics & Gynecology

## 2020-05-24 ENCOUNTER — Ambulatory Visit (INDEPENDENT_AMBULATORY_CARE_PROVIDER_SITE_OTHER): Payer: BC Managed Care – PPO | Admitting: Obstetrics & Gynecology

## 2020-05-24 DIAGNOSIS — N9089 Other specified noninflammatory disorders of vulva and perineum: Secondary | ICD-10-CM

## 2020-05-31 DIAGNOSIS — N83292 Other ovarian cyst, left side: Secondary | ICD-10-CM | POA: Diagnosis not present

## 2020-05-31 DIAGNOSIS — Z3141 Encounter for fertility testing: Secondary | ICD-10-CM | POA: Diagnosis not present

## 2020-05-31 DIAGNOSIS — N83291 Other ovarian cyst, right side: Secondary | ICD-10-CM | POA: Diagnosis not present

## 2020-05-31 DIAGNOSIS — N801 Endometriosis of ovary: Secondary | ICD-10-CM | POA: Diagnosis not present

## 2020-05-31 DIAGNOSIS — N8500 Endometrial hyperplasia, unspecified: Secondary | ICD-10-CM | POA: Diagnosis not present

## 2020-06-17 ENCOUNTER — Telehealth: Payer: Self-pay | Admitting: Obstetrics and Gynecology

## 2020-06-17 ENCOUNTER — Telehealth: Payer: Self-pay | Admitting: Obstetrics & Gynecology

## 2020-06-17 NOTE — Telephone Encounter (Signed)
Patient canceled her vulvar irritation appointment tomorrow due to work. She is aksing for a later appointment tomorrow if possible?

## 2020-06-17 NOTE — Telephone Encounter (Signed)
Patient is having vulvar irritation . Patient wants to come in today if possible. Patient is aware that Dr. Sabra Heck is on vacation.

## 2020-06-17 NOTE — Telephone Encounter (Signed)
Spoke with patient. Patient reports external vulvar irritation after wearing fitted jeans a couple of days ago. Reports thick vaginal d/c, no odor or color, this is new. Denies any other GYN symptoms. Requesting OV with covering provider. Offered OV today at 10am with Dr. Quincy Simmonds, patient declined. OV scheduled for 8/13 at 11:30am with Dr. Quincy Simmonds. Patient verbalizes understanding and is agreeable.   Last OV for vulvar irritation on 05/24/20  Routing to provider for final review. Patient is agreeable to disposition. Will close encounter.

## 2020-06-17 NOTE — Telephone Encounter (Signed)
Spoke with patient. Offered 8/13 at 3:30pm with Dr. Quincy Simmonds. Patient declined, request 11:30am appt, OV scheduled. Patient agreeable to date and time.   Encounter closed.

## 2020-06-17 NOTE — Progress Notes (Signed)
GYNECOLOGY  VISIT   HPI: 29 y.o.   Married  Caucasian  female   G2P0111 with Patient's last menstrual period was 05/23/2020 (exact date).   here for vulvar irritation. Per patient, has pain or discomfort on the vulvar area while wearing clothes, small discharge, and some itching.   She has a change in discharge when her symptoms are occurring.  She shows a picture on her cell phone, and it looks like yellow stringy egg yolk.  No odor.   States she had negative STD testing.  Had an E Coli skin culture. She was treated with Bactrim DS and symptoms did go away.   Used a steroid cream, and this did not help.   She used Lidocaine, and this helps.   Trying to do an embryo transfer with her next menstrual cycle through Dr. Kerin Perna.   Hx of endometriosis and has rectovaginal/uterine disease.   GYNECOLOGIC HISTORY: Patient's last menstrual period was 05/23/2020 (exact date). Contraception: none Menopausal hormone therapy: Estrogen cream Last mammogram:  n/a Last pap smear: 05-12-19 Neg, 04-26-16 Neg        OB History    Gravida  2   Para  1   Term  0   Preterm  1   AB  1   Living  1     SAB  1   TAB  0   Ectopic  0   Multiple      Live Births  1              Patient Active Problem List   Diagnosis Date Noted  . Personal history of covid-19 04/06/2020  . Cesarean delivery 09/29/2018  . Leukocytosis 03/28/2017  . Thrombocytosis ([place]) 03/28/2017  . Prolonged Q-T interval on ECG 03/28/2017  . Endometriosis 03/20/2017  . 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 Multinodular goiter 05/29/2016  . Allergic rhinitis     Past Medical History:  Diagnosis Date  . Allergic rhinitis   . Allergy   . Asthma   . Endometriosis   . Thyroid nodule     Past Surgical History:  Procedure Laterality Date  . CESAREAN SECTION N/A 09/29/2018   Procedure: CESAREAN SECTION;  Surgeon: Brien Few, MD;  Location: [facility];  Service: Obstetrics;  Laterality: N/A;   . CHROMOPERTUBATION Bilateral 03/20/2017   Procedure: CHROMOPERTUBATION;  Surgeon: Megan Salon, MD;  Location: [facility];  Service: Gynecology;  Laterality: Bilateral;  Fallopian tubes  . CYSTOSCOPY N/A 03/20/2017   Procedure: CYSTOSCOPY;  Surgeon: Megan Salon, MD;  Location: [facility];  Service: Gynecology;  Laterality: N/A;  . FOOT SURGERY Left    neuromona  . LAPAROSCOPIC OVARIAN CYSTECTOMY Bilateral 03/20/2017   Procedure: LAPAROSCOPIC OVARIAN CYSTECTOMY;  Surgeon: Megan Salon, MD;  Location: [facility];  Service: Gynecology;  Laterality: Bilateral;  . LAPAROSCOPIC UNILATERAL SALPINGECTOMY Right 03/20/2017   Procedure: LAPAROSCOPIC UNILATERAL SALPINGECTOMY;  Surgeon: Megan Salon, MD;  Location: [facility];  Service: Gynecology;  Laterality: Right;  . LAPAROSCOPY Bilateral 03/20/2017   Procedure: LAPAROSCOPY OPERATIVE  WITH LYSIS OF ADHESIONS;  Surgeon: Megan Salon, MD;  Location: [facility];  Service: Gynecology;  Laterality: Bilateral;  . WISDOM TOOTH EXTRACTION  03/28/2013  . WISDOM TOOTH EXTRACTION      Current Outpatient Medications  Medication Sig Dispense Refill  . acetaminophen (TYLENOL) 325 MG tablet Take 650 mg by mouth every 6 (six) hours as needed for moderate pain.    . diphenhydrAMINE (BENADRYL) 25 mg capsule Take  1 capsule (25 mg total) by mouth every 6 (six) hours as needed for itching. 30 capsule 0  . loratadine (CLARITIN) 10 MG tablet Take 10 mg by mouth daily.    Marland Kitchen estradiol (ESTRACE VAGINAL) 0.1 MG/GM vaginal cream Apply pea size amount externally to vulva 2-3 times per week. (Patient not taking: Reported on 06/18/2020) 42.5 g 0  . norethindrone (AYGESTIN) 5 MG tablet Take 5 mg by mouth daily.  (Patient not taking: Reported on 06/18/2020)     No current facility-administered medications for this visit.     ALLERGIES: Shellfish allergy, Augmentin [amoxicillin-pot clavulanate], Ciprofloxacin, and Clavulanic acid  Family History  Problem Relation Age of Onset  . Breast  cancer Mother 76        mastectomy, negative genetic testing  . Thyroid disease Neg Hx   . Cancer Mother     Social History   Socioeconomic History  . Marital status: Married    Spouse name: Not on file  . Number of children: Not on file  . Years of education: Not on file  . Highest education level: Not on file  Occupational History  . Not on file  Tobacco Use  . Smoking status: Never Smoker  . Smokeless tobacco: Never Used  Vaping Use  . Vaping Use: Never used  Substance and Sexual Activity  . Alcohol use: Not Currently    Alcohol/week: 1.0 - 3.0 standard drink    Types: 1 - 3 Standard drinks or equivalent per week    Comment: occasional   . Drug use: No  . Sexual activity: Yes    Partners: Male    Birth control/protection: None  Other Topics Concern  . Not on file  Social History Narrative   ** Merged History Encounter **       Social Determinants of Health   Financial Resource Strain:   . Difficulty of Paying Living Expenses:   Food Insecurity:   . Worried About Charity fundraiser in the Last Year:   . Arboriculturist in the Last Year:   Transportation Needs:   . Film/video editor (Medical):   Marland Kitchen Lack of Transportation (Non-Medical):   Physical Activity:   . Days of Exercise per Week:   . Minutes of Exercise per Session:   Stress:   . Feeling of Stress :   Social Connections:   . Frequency of Communication with Friends and Family:   . Frequency of Social Gatherings with Friends and Family:   . Attends Religious Services:   . Active Member of Clubs or Organizations:   . Attends Archivist Meetings:   Marland Kitchen Marital Status:   Intimate Partner Violence:   . Fear of Current or Ex-Partner:   . Emotionally Abused:   Marland Kitchen Physically Abused:   . Sexually Abused:     Review of Systems  Constitutional: Negative.   HENT: Negative.   Eyes: Negative.   Respiratory: Negative.   Cardiovascular: Negative.   Gastrointestinal: Negative.   Endocrine:  Negative.   Genitourinary:       Vulvar irritation Vaginal itching Vaginal discharge   Musculoskeletal: Negative.   Skin: Negative.   Allergic/Immunologic: Negative.   Neurological: Negative.   Hematological: Negative.   Psychiatric/Behavioral: Negative.     PHYSICAL EXAMINATION:    BP 102/62 (BP Location: Right Arm, Patient Position: Sitting, Cuff Size: Normal)   Pulse 64   Resp 12   Ht 5\' 2"  (1.575 m)   Wt 125 lb (56.7  kg)   LMP 05/23/2020 (Exact Date)   BMI 22.86 kg/m     General appearance: alert, cooperative and appears stated age  Pelvic: External genitalia:  no lesions              Urethra:  normal appearing urethra with no masses, tenderness or lesions              Bartholins and Skenes: normal                 Vagina: normal appearing vagina with normal color and discharge, no lesions              Cervix: no lesions                Bimanual Exam:  Uterus:  normal size, contour, position, consistency, mobility, non-tender              Adnexa: no mass, fullness, tenderness         Chaperone was present for exam.  ASSESSMENT  Vulvovaginitis.  Vulvar pain.  ?vulvodynia Planning IVF transfer.  Hx endometrisis.   PLAN  Nuswab.  Wound culture. Treatment will be based on results above. Use lidocaine externally.  Ok for triamcinolone externally.  We discussed vulvodynia briefly.  Biopsy has been previously considered.  FU prn.

## 2020-06-18 ENCOUNTER — Other Ambulatory Visit: Payer: Self-pay

## 2020-06-18 ENCOUNTER — Other Ambulatory Visit (HOSPITAL_COMMUNITY)
Admission: RE | Admit: 2020-06-18 | Discharge: 2020-06-18 | Disposition: A | Discharge status: Home / Self Care | Payer: Self-pay | Source: Ambulatory Visit | Attending: Obstetrics and Gynecology | Admitting: Obstetrics and Gynecology

## 2020-06-18 ENCOUNTER — Ambulatory Visit (INDEPENDENT_AMBULATORY_CARE_PROVIDER_SITE_OTHER): Payer: Self-pay | Admitting: Obstetrics and Gynecology

## 2020-06-18 ENCOUNTER — Ambulatory Visit: Payer: Self-pay | Admitting: Obstetrics and Gynecology

## 2020-06-18 ENCOUNTER — Encounter: Payer: Self-pay | Admitting: Obstetrics and Gynecology

## 2020-06-18 VITALS — BP 102/62 | HR 64 | Resp 12 | Ht 62.0 in | Wt 125.0 lb

## 2020-06-18 DIAGNOSIS — R102 Pelvic and perineal pain: Secondary | ICD-10-CM

## 2020-06-18 DIAGNOSIS — N760 Acute vaginitis: Secondary | ICD-10-CM | POA: Insufficient documentation

## 2020-06-21 LAB — CERVICOVAGINAL ANCILLARY ONLY
Bacterial Vaginitis (gardnerella): NEGATIVE
Candida Glabrata: NEGATIVE
Candida Vaginitis: NEGATIVE
Comment: NEGATIVE
Comment: NEGATIVE
Comment: NEGATIVE
Comment: NEGATIVE
Trichomonas: NEGATIVE

## 2020-06-24 ENCOUNTER — Telehealth: Payer: Self-pay

## 2020-06-24 LAB — WOUND CULTURE

## 2020-06-24 NOTE — Telephone Encounter (Signed)
Spoke with pt. Pt given results and recommendations per Dr Quincy Simmonds. Pt aware of Dr Sabra Heck talking to infectious diease and will return call with updated recommendations. Pt agreeable.  Pharmacy verified.   Routing to Dr Sabra Heck.

## 2020-06-24 NOTE — Telephone Encounter (Signed)
-----   Message from Nunzio Cobbs, MD sent at 06/24/2020  8:27 AM EDT ----- Triage,   Please contact patient with results of testing.   She tested negative for yeast, bacterial vaginosis, and trichomonas.   She did test positive for E Coli of the vulvar skin, which is resistant to multiple organisms.  Due to her allergies, this also limits some available choices.   Dr. Sabra Heck will be reaching out to Infectious Disease to review this case, as she is familiar with her patient.   Cc - Dr. Sabra Heck

## 2020-06-25 NOTE — Telephone Encounter (Signed)
Patient is calling to check status of medication. Patient stated that she is eager to start medication today and does not want to wait through the weekend without starting something.

## 2020-06-25 NOTE — Telephone Encounter (Signed)
Reviewed with Dr. Sabra Heck. Call returned to patient.  Awaiting return call from infectious disease.  Reviewed OTC Hibiclens daily when bathing in the meantime.  Our office will f/u once additional recommendations received.  Patient agreeable to plan and thankful for call.

## 2020-06-30 NOTE — Telephone Encounter (Signed)
Spoke with patient. Patient calling for update on infectious disease recommendations.   She did start the Hibiclens as recommended, reports she is "not hurting, but has not been wearing real pants". Denies any new symptoms.   Advised Dr. Sabra Heck is out of the office today, will f/u with her on 8/26 and return call. Patient agreeable.   Dr. Sabra Heck -please provide update.

## 2020-07-01 NOTE — Telephone Encounter (Signed)
Patient called to talk with Dr.Miller's nurse. Patient did not give any details.

## 2020-07-01 NOTE — Telephone Encounter (Signed)
Routing to Dr. Miller to review and advise.  

## 2020-07-02 ENCOUNTER — Other Ambulatory Visit: Payer: Self-pay | Admitting: Obstetrics & Gynecology

## 2020-07-02 MED ORDER — AMOXICILLIN-POT CLAVULANATE 875-125 MG PO TABS
1.0000 | ORAL_TABLET | Freq: Two times a day (BID) | ORAL | 0 refills | Status: DC
Start: 2020-07-02 — End: 2020-07-29

## 2020-07-02 NOTE — Telephone Encounter (Signed)
Patient says she is starting her IVF medications and would like to speak with nurse.

## 2020-07-02 NOTE — Telephone Encounter (Signed)
Spoke with Dr. Baxter Flattery, infectious disease, about pt's skin culture with e coli and multiple drug resistance.  Pt has childhood allergy to Augmentin.  Dr. Baxter Flattery recommended using Augment 875 bid x 5 days because childhood allergies often resolve.  Called pt personally.  Rx to pharmacy.  Reviewed medications for IVF.  No interactions present.  Pt aware of signs/symptoms of allergies and to stop immediately, take benadryl and call if has any allergy symptoms.  Also, if she tolerates well, advised to call and let us know so we can remove from allergy list.  Ok to close encounter.

## 2020-07-09 ENCOUNTER — Ambulatory Visit: Payer: BC Managed Care – PPO | Admitting: Obstetrics & Gynecology

## 2020-07-13 ENCOUNTER — Encounter: Payer: Self-pay | Admitting: Obstetrics & Gynecology

## 2020-07-13 ENCOUNTER — Telehealth: Payer: Self-pay

## 2020-07-13 DIAGNOSIS — R102 Pelvic and perineal pain: Secondary | ICD-10-CM

## 2020-07-13 DIAGNOSIS — N9089 Other specified noninflammatory disorders of vulva and perineum: Secondary | ICD-10-CM

## 2020-07-13 NOTE — Telephone Encounter (Signed)
Pt sent following mychart message:  Jacqueline, [name]  P [facility] Dr. Sabra Heck,  I wanted to message to let you know I finished the antibiotics last week but have no improvement in symptoms.  Thanks,  Jacqueline [name]

## 2020-07-13 NOTE — Telephone Encounter (Signed)
Spoke with pt. Pt given recommendations per Dr Sabra Heck. Pt verbalized understanding.   Pt states has [place] with Dr Kerin Perna tomorrow on 07/14/20 to check uterine lining with possible embryo transfer.  Advised will give update to Dr Sabra Heck and return call. Pt agreeable.   Routing to Dr Sabra Heck, please advise when to have skin culture with possible bx

## 2020-07-13 NOTE — Telephone Encounter (Signed)
If can come in for repeat skin culture, would recommend this.  Where is she in her IVF cycle?  May need skin biopsy as well but won't do this depending on where she is in the IVF cycle.

## 2020-07-13 NOTE — Telephone Encounter (Signed)
Pt had Rx Augmentin 07/02/20 x 5 days. Pt states sx have not resolved.   Routing to Dr Sabra Heck, please advise.

## 2020-07-14 ENCOUNTER — Encounter: Payer: Self-pay | Admitting: Obstetrics & Gynecology

## 2020-07-14 DIAGNOSIS — Z113 Encounter for screening for infections with a predominantly sexual mode of transmission: Secondary | ICD-10-CM | POA: Diagnosis not present

## 2020-07-15 NOTE — Telephone Encounter (Signed)
Pt sent updated mychart message:   Jacqueline [name], Jacqueline [name] "[name]"  P [facility] Just left my appointment and wanted to let you know my transfer is scheduled for Friday 9/17.  I was curious if bactrim is an option again since it cleared it up the first time?

## 2020-07-15 NOTE — Telephone Encounter (Signed)
The e coli was resistent to this so this isn't an option.  I would have prescribed this already for her.  I think it's time for the skin biopsy and we should repeat the skin culture.  The skin culture can be done now.  Does she want me to check with Dr. Kerin Perna to make sure the biopsy is ok .  She can wait until after the transfer as well.  It's just a skin biopsy so I wouldn't do anything that was unsafe in early pregnancy.

## 2020-07-15 NOTE — Telephone Encounter (Signed)
Left message for pt to return call to triage RN. 

## 2020-07-16 NOTE — Telephone Encounter (Signed)
Spoke with pt. Pt given update and recommendations per Dr Sabra Heck. Pt asking about taking Cipro Rx to treat and then if doesn't work, then to do skin biopsy afterwards ?  Pt states is not allergic to Cipro and doesn't remember having the QT prolongation as stated in her allergy list. Pt verbalized ok to take if that is something Dr Sabra Heck thinks will clear it up.   Advised will review with Dr Sabra Heck and return call. Pt agreeable Declines making a skin biopsy OV at this time. States can leave detailed message on cell number.   Routing to Dr Sabra Heck. Please advise

## 2020-07-16 NOTE — Telephone Encounter (Signed)
Spoke with patient, advised per Dr. Sabra Heck. Patient request to proceed with vulvar biopsy. OV  scheduled for 07/29/20 at 4:30pm, patient declined earlier appt dates. Order placed for precert. Patient is agreeable to date and time.   Routing to provider for final review. Patient is agreeable to disposition. Will close encounter.  Cc: Magdalene Patricia, Hayley Carder

## 2020-07-16 NOTE — Telephone Encounter (Signed)
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  It was documented in her hospital stay and on the discharge summary.  She really shouldn't take Cipro.  So, I would proceed with skin biopsy whenever she wants and repeat the skin culture as well.

## 2020-07-29 ENCOUNTER — Ambulatory Visit (INDEPENDENT_AMBULATORY_CARE_PROVIDER_SITE_OTHER): Payer: Self-pay | Admitting: Obstetrics & Gynecology

## 2020-07-29 ENCOUNTER — Other Ambulatory Visit: Payer: Self-pay

## 2020-07-29 ENCOUNTER — Encounter: Payer: Self-pay | Admitting: Obstetrics & Gynecology

## 2020-07-29 ENCOUNTER — Other Ambulatory Visit (HOSPITAL_COMMUNITY)
Admission: RE | Admit: 2020-07-29 | Discharge: 2020-07-29 | Disposition: A | Discharge status: Home / Self Care | Payer: Self-pay | Source: Ambulatory Visit | Attending: Obstetrics & Gynecology | Admitting: Obstetrics & Gynecology

## 2020-07-29 DIAGNOSIS — N9089 Other specified noninflammatory disorders of vulva and perineum: Secondary | ICD-10-CM | POA: Insufficient documentation

## 2020-07-29 DIAGNOSIS — R102 Pelvic and perineal pain: Secondary | ICD-10-CM

## 2020-07-30 NOTE — Progress Notes (Signed)
29 y.o. Married White female here for vulvar biopsy due to recurrent vulvar irritation.  Pt has also has vulvar culture showing e coli x 2 with second one showing multiple drug resistances.  These were reviewed with pt.    Just underwent embryo transfer.  Has faint positive UPT at home today.  Very excited.  Procedure described to patient.  Informed consent obtained.  All questions answered before proceeding.    Exam:   BP 118/76   Pulse 70   Resp 16   Wt 123 lb (55.8 kg)   LMP 07/05/2020 (Within Weeks)   Breastfeeding No   BMI 22.50 kg/m  General appearance: alert and no distress Lymph:  Inguinal adenopathy: negative  External genitalia:  no lesions Pap taken: No.  Procedure:  Area cleansed with Betadine.  Sterile technique used throughout procedure.  Skin anesthestized with Lidocaine 1% plain; 1.68mL.   7mm punch biopsy used to obtain specimen.  Biopsy grasped with pick-ups and excised with scissors.  Adequate hemostasis obtained with silver nitrate sticks.  Dressing was not applied.  Pt tolerated procedure well  Specimen(s) labeled as labia majora right and sent to pathology  Assessment:  Vulvar irritation that has been present for months.  Plan:  Patient will be notified of pathology results via phone call as well as wound culture and additional recommendations will be made at that time.

## 2020-08-01 LAB — WOUND CULTURE

## 2020-08-02 DIAGNOSIS — Z3201 Encounter for pregnancy test, result positive: Secondary | ICD-10-CM | POA: Diagnosis not present

## 2020-08-02 DIAGNOSIS — Z3200 Encounter for pregnancy test, result unknown: Secondary | ICD-10-CM | POA: Diagnosis not present

## 2020-08-03 ENCOUNTER — Other Ambulatory Visit: Payer: Self-pay

## 2020-08-04 DIAGNOSIS — Z3201 Encounter for pregnancy test, result positive: Secondary | ICD-10-CM | POA: Diagnosis not present

## 2020-08-04 DIAGNOSIS — Z3200 Encounter for pregnancy test, result unknown: Secondary | ICD-10-CM | POA: Diagnosis not present

## 2020-08-04 LAB — SURGICAL PATHOLOGY

## 2020-08-05 ENCOUNTER — Telehealth: Payer: Self-pay

## 2020-08-05 MED ORDER — CLOBETASOL PROPIONATE 0.05 % EX OINT: TOPICAL_OINTMENT | CUTANEOUS | 0 refills | Status: DC

## 2020-08-05 NOTE — Telephone Encounter (Signed)
-----   Message from Megan Salon, MD sent at 08/05/2020  6:29 AM EDT ----- Please let patient know that the skin biopsy showed lichen simplex chronicus.  The skin changes associated with irritation.  She should start topical clobetasol 0.05% ointment twice daily for 2 weeks.  Okay to send in prescription.  This will not interfere with early pregnancy.  Please ask for an update regarding her pregnancy.  She did have positive pregnancy test.  We will need to transition care of this to her OB once she decides where she is going and has her new OB appointment.  CC, [place], [place]

## 2020-08-05 NOTE — Telephone Encounter (Signed)
Spoke with pt. Pt given results and recommendations per Dr Sabra Heck. Pt agreeable and verbalized understanding.  Pt states has not made new OB appt yet, but will send message through mychart once has appt. Pt considering [employer].  Rx sent to pharmacy on file.  Encounter closed.

## 2020-08-06 DIAGNOSIS — Z3201 Encounter for pregnancy test, result positive: Secondary | ICD-10-CM | POA: Diagnosis not present

## 2020-08-06 DIAGNOSIS — Z3200 Encounter for pregnancy test, result unknown: Secondary | ICD-10-CM | POA: Diagnosis not present

## 2020-08-13 DIAGNOSIS — Z3200 Encounter for pregnancy test, result unknown: Secondary | ICD-10-CM | POA: Diagnosis not present

## 2020-08-19 DIAGNOSIS — O0900 Supervision of pregnancy with history of infertility, unspecified trimester: Secondary | ICD-10-CM | POA: Diagnosis not present

## 2020-09-01 DIAGNOSIS — O0900 Supervision of pregnancy with history of infertility, unspecified trimester: Secondary | ICD-10-CM | POA: Diagnosis not present

## 2020-09-13 DIAGNOSIS — Z3A10 10 weeks gestation of pregnancy: Secondary | ICD-10-CM | POA: Diagnosis not present

## 2020-09-13 DIAGNOSIS — Z3689 Encounter for other specified antenatal screening: Secondary | ICD-10-CM | POA: Diagnosis not present

## 2020-09-13 DIAGNOSIS — O09811 Supervision of pregnancy resulting from assisted reproductive technology, first trimester: Secondary | ICD-10-CM | POA: Diagnosis not present

## 2020-10-13 DIAGNOSIS — Z118 Encounter for screening for other infectious and parasitic diseases: Secondary | ICD-10-CM | POA: Diagnosis not present

## 2020-10-24 ENCOUNTER — Inpatient Hospital Stay (HOSPITAL_COMMUNITY)
Admission: AD | Admit: 2020-10-24 | Discharge: 2020-10-24 | Disposition: A | Discharge status: Home / Self Care | Payer: BC Managed Care – PPO | Attending: Obstetrics & Gynecology | Admitting: Obstetrics & Gynecology

## 2020-10-24 ENCOUNTER — Inpatient Hospital Stay (HOSPITAL_BASED_OUTPATIENT_CLINIC_OR_DEPARTMENT_OTHER): Payer: BC Managed Care – PPO

## 2020-10-24 ENCOUNTER — Other Ambulatory Visit: Payer: Self-pay

## 2020-10-24 ENCOUNTER — Encounter (HOSPITAL_COMMUNITY): Payer: Self-pay

## 2020-10-24 DIAGNOSIS — O09812 Supervision of pregnancy resulting from assisted reproductive technology, second trimester: Secondary | ICD-10-CM | POA: Diagnosis not present

## 2020-10-24 DIAGNOSIS — O09212 Supervision of pregnancy with history of pre-term labor, second trimester: Secondary | ICD-10-CM | POA: Diagnosis not present

## 2020-10-24 DIAGNOSIS — O4412 Placenta previa with hemorrhage, second trimester: Secondary | ICD-10-CM

## 2020-10-24 DIAGNOSIS — O4692 Antepartum hemorrhage, unspecified, second trimester: Secondary | ICD-10-CM | POA: Diagnosis not present

## 2020-10-24 DIAGNOSIS — O4690 Antepartum hemorrhage, unspecified, unspecified trimester: Secondary | ICD-10-CM

## 2020-10-24 DIAGNOSIS — Z3A16 16 weeks gestation of pregnancy: Secondary | ICD-10-CM

## 2020-10-24 DIAGNOSIS — O4402 Placenta previa specified as without hemorrhage, second trimester: Secondary | ICD-10-CM

## 2020-10-24 DIAGNOSIS — O09292 Supervision of pregnancy with other poor reproductive or obstetric history, second trimester: Secondary | ICD-10-CM

## 2020-10-24 LAB — URINALYSIS, ROUTINE W REFLEX MICROSCOPIC
Bilirubin Urine: NEGATIVE
Glucose, UA: NEGATIVE mg/dL
Ketones, ur: NEGATIVE mg/dL
Leukocytes,Ua: NEGATIVE
Nitrite: NEGATIVE
Protein, ur: NEGATIVE mg/dL
Specific Gravity, Urine: 1.009 (ref 1.005–1.030)
pH: 6 (ref 5.0–8.0)

## 2020-10-24 NOTE — Discharge Instructions (Signed)
Placenta Previa Placenta previa is a condition in which the placenta implants in the lower part of the uterus in pregnant women. The placenta either partially or completely covers the opening to the cervix. This is a problem because the baby must pass through the cervix during delivery. There are three types of placenta previa:  Marginal placenta previa. The placenta reaches within an inch (2.5 cm) of the cervical opening but does not cover it.  Partial placenta previa. The placenta covers part of the cervical opening.  Complete placenta previa. The placenta covers the entire cervical opening. If the previa is marginal or partial and it is diagnosed in the first half of pregnancy, the placenta may move into a normal position as the pregnancy progresses and may no longer cover the cervix. It is important to keep all prenatal visits with your health care provider so you can be more closely monitored. What are the causes? The cause of this condition is not known. What increases the risk? This condition is more likely to develop in women who:  Are carrying more than one baby (multiples).  Have an abnormally shaped uterus.  Have scars on the lining of the uterus.  Have had surgeries involving the uterus, such as a cesarean delivery.  Have delivered a baby before.  Have a history of placenta previa.  Have smoked or used cocaine during pregnancy.  Are age 30 or older during pregnancy. What are the signs or symptoms? The main symptom of this condition is sudden, painless vaginal bleeding during the second half of pregnancy. The amount of bleeding can be very light at first, and it usually stops on its own. Heavier bleeding episodes may also happen. Some women with placenta previa may have no bleeding at all. How is this diagnosed?  This condition is diagnosed: ? From an ultrasound. This test uses sound waves to find where the placenta is located before you have any bleeding  episodes. ? During a checkup after vaginal bleeding is noticed.  If you are diagnosed with a partial or complete previa, digital exams with fingers will generally be avoided. Your health care provider will still perform a speculum exam.  If you did not have an ultrasound during your pregnancy, placenta previa may not be diagnosed until bleeding occurs during labor. How is this treated? Treatment for this condition may include:  Decreased activity.  Bed rest at home or in the hospital.  Pelvic rest. Nothing is placed inside the vagina during pelvic rest. This means not having sex and not using tampons or douches.  A blood transfusion to replace blood that you have lost (maternal blood loss).  A cesarean delivery. This may be performed if: ? The bleeding is heavy and cannot be controlled. ? The placenta completely covers the cervix.  Medicines to stop premature labor or to help the baby's lungs to mature. This treatment may be used if you need delivery before your pregnancy is full-term. Your treatment will be decided based on:  How much you are bleeding, or whether the bleeding has stopped.  How far along you are in your pregnancy.  The condition of your baby.  The type of placenta previa that you have.  Follow these instructions at home:  Get plenty of rest and lessen activity as told by your health care provider.  Stay on bed rest for as long as told by your health care provider.  Do not have sex, use tampons, use a douche, or place anything inside of  your vagina if your health care provider recommended pelvic rest.  Take over-the-counter and prescription medicines as told by your health care provider.  Keep all follow-up visits as told by your health care provider. This is important. Get help right away if:  You have vaginal bleeding, even if in small amounts and even if you have no pain.  You have cramping or regular contractions.  You have pain in your abdomen or  your lower back.  You have a feeling of increased pressure in your pelvis.  You have increased watery or bloody mucus from the vagina. This information is not intended to replace advice given to you by your health care provider. Make sure you discuss any questions you have with your health care provider. Document Revised: 10/05/2017 Document Reviewed: 05/06/2016 Elsevier Patient Education  [place].  Activity Restriction During Pregnancy Your health care provider may recommend specific activity restrictions during pregnancy for a variety of reasons. Activity restriction may require that you limit activities that require great effort, such as exercise, lifting, or sex. The type of activity restriction will vary for each person, depending on your risk or the problems you are having. Activity restriction may be recommended for a period of time until your baby is delivered. Why are activity restrictions recommended? Activity restriction may be recommended if:  Your placenta is partially or completely covering the opening of your cervix (placenta previa).  There is bleeding between the wall of the uterus and the amniotic sac in the first trimester of pregnancy (subchorionic hemorrhage).  You went into labor too early (preterm labor).  You have a history of miscarriage.  You have a condition that causes high blood pressure during pregnancy (preeclampsia or eclampsia).  You are pregnant with more than one baby.  Your baby is not growing well. What are the risks? The risks depend on your specific restriction. Strict bed rest has the most physical and emotional risks and is no longer routinely recommended. Risks of strict bed rest include:  Loss of muscle conditioning from not moving.  Blood clots.  Social isolation.  Depression.  Loss of income. Talk with your health care team about activity restriction to decide if it is best for you and your baby. Even if you are having  problems during your pregnancy, you may be able to continue with normal levels of activity with careful monitoring by your health care team. Follow these instructions at home: If needed, based on your overall health and the health of your baby, your health care provider will decide which type of activity restriction is right for you. Activity restrictions may include:  Not lifting anything heavier than 10 pounds (4.5 kg).  Avoiding activities that take a lot of physical effort.  No lifting or straining.  Resting in a sitting position or lying down for periods of time during the day. Pelvic rest may be recommended along with activity restrictions. If pelvic rest is recommended, then:  Do not have sex, an orgasm, or use sexual stimulation.  Do not use tampons. Do not douche. Do not put anything into your vagina.  Do not lift anything that is heavier than 10 lb (4.5 kg).  Avoid activities that require a lot of effort.  Avoid any activity in which your pelvic muscles could become strained, such as squatting. Questions to ask your health care provider  Why is my activity being limited?  How will activity restrictions affect my body?  Why is rest helpful for me and  my baby?  What activities can I do?  When can I return to normal activities? When should I seek immediate medical care? Seek immediate medical care if you have:  Vaginal bleeding.  Vaginal discharge.  Cramping pain in your lower abdomen.  Regular contractions.  A low, dull backache. Summary  Your health care provider may recommend specific activity restrictions during pregnancy for a variety of reasons.  Activity restriction may require that you limit activities such as exercise, lifting, sex, or any other activity that requires great effort.  Discuss the risks and benefits of activity restriction with your health care team to decide if it is best for you and your baby.  Contact your health care provider  right away if you think you are having contractions, or if you notice vaginal bleeding, discharge, or cramping. This information is not intended to replace advice given to you by your health care provider. Make sure you discuss any questions you have with your health care provider. Document Revised: 07/16/2019 Document Reviewed: 02/12/2018 Elsevier Patient Education  [place].

## 2020-10-24 NOTE — MAU Provider Note (Signed)
Chief Complaint:  Vaginal Bleeding   Event Date/Time   First Provider Initiated Contact with Patient 10/24/20 0849     HPI: Jacqueline [name] is a 29 y.o. [id number] at 32w0dwho presents to maternity admissions reporting vaginal bleeding since Friday. Was told to observe at home, but comes in due to persistent bleeding.  Has a history of placenta previa with last pregnancy.. She denies LOF, vaginal itching/burning, urinary symptoms, h/a, dizziness, n/v, diarrhea, constipation or fever/chills.    Vaginal Bleeding The patient's primary symptoms include vaginal bleeding. The patient's pertinent negatives include no genital itching, genital lesions, genital odor or pelvic pain. This is a new problem. The current episode started in the past 7 days. The problem occurs intermittently. The problem has been unchanged. The patient is experiencing no pain. She is pregnant. Pertinent negatives include no back pain, chills, constipation, diarrhea, fever, nausea or vomiting. The vaginal discharge was bloody. The vaginal bleeding is lighter than menses. She has not been passing clots. She has not been passing tissue.    RN Note: Jacqueline [name] is a 29 y.o. at [redacted]w[redacted]d here in MAU reporting: started bleeding on Friday night, states it will taper off and then come back. Is wearing a pad but states not filling a pad. No pain. No recent IC. Onset of complaint: Friday night      Pain score: 0/10  Past Medical History: Past Medical History:  Diagnosis Date  . Allergic rhinitis   . Allergy   . Asthma   . Endometriosis   . Thyroid nodule     Past obstetric history: OB History  Gravida Para Term Preterm AB Living  3 1 0 1 1 1   SAB IAB Ectopic Multiple Live Births  1 0 0   1    # Outcome Date GA Lbr Len/2nd Weight Sex Delivery Anes PTL Lv  3 Current           2 Preterm 09/29/18 [redacted]w[redacted]d  2070 g M CS-LTranv Spinal  LIV  1 SAB 07/2016            Past Surgical History: Past Surgical History:  Procedure Laterality Date   . CESAREAN SECTION N/A 09/29/2018   Procedure: CESAREAN SECTION;  Surgeon: Brien Few, MD;  Location: [facility];  Service: Obstetrics;  Laterality: N/A;  . CHROMOPERTUBATION Bilateral 03/20/2017   Procedure: CHROMOPERTUBATION;  Surgeon: Megan Salon, MD;  Location: [facility];  Service: Gynecology;  Laterality: Bilateral;  Fallopian tubes  . CYSTOSCOPY N/A 03/20/2017   Procedure: CYSTOSCOPY;  Surgeon: Megan Salon, MD;  Location: [facility];  Service: Gynecology;  Laterality: N/A;  . FOOT SURGERY Left    neuromona  . LAPAROSCOPIC OVARIAN CYSTECTOMY Bilateral 03/20/2017   Procedure: LAPAROSCOPIC OVARIAN CYSTECTOMY;  Surgeon: Megan Salon, MD;  Location: [facility];  Service: Gynecology;  Laterality: Bilateral;  . LAPAROSCOPIC UNILATERAL SALPINGECTOMY Right 03/20/2017   Procedure: LAPAROSCOPIC UNILATERAL SALPINGECTOMY;  Surgeon: Megan Salon, MD;  Location: [facility];  Service: Gynecology;  Laterality: Right;  . LAPAROSCOPY Bilateral 03/20/2017   Procedure: LAPAROSCOPY OPERATIVE  WITH LYSIS OF ADHESIONS;  Surgeon: Megan Salon, MD;  Location: [facility];  Service: Gynecology;  Laterality: Bilateral;  . WISDOM TOOTH EXTRACTION  03/28/2013  . WISDOM TOOTH EXTRACTION      Family History: Family History  Problem Relation Age of Onset  . Breast cancer Mother 61        mastectomy, negative genetic testing  . Thyroid disease Neg Hx   . Cancer  Mother     Social History: Social History   Tobacco Use  . Smoking status: Never Smoker  . Smokeless tobacco: Never Used  Vaping Use  . Vaping Use: Never used  Substance Use Topics  . Alcohol use: Not Currently  . Drug use: No    Allergies:  Allergies  Allergen Reactions  . Shellfish Allergy Anaphylaxis  . Augmentin [Amoxicillin-Pot Clavulanate]     childhood  . Ciprofloxacin     Caused QT prolongation  . Clavulanic Acid Rash    Had rash with Augmentin as a child but tolerates amoxicillin;     Meds:  Medications Prior to Admission   Medication Sig Dispense Refill Last Dose  . acetaminophen (TYLENOL) 325 MG tablet Take 650 mg by mouth every 6 (six) hours as needed for moderate pain.     . clobetasol ointment (TEMOVATE) 0.05 % topical clobetasol 0.05% ointment twice daily for 2 weeks. 30 g 0   . diphenhydrAMINE (BENADRYL) 25 mg capsule Take 1 capsule (25 mg total) by mouth every 6 (six) hours as needed for itching. 30 capsule 0   . estradiol (VIVELLE-DOT) 0.1 MG/24HR patch SMARTSIG:1 Patch(s) T-DERMAL Every 3 Days     . loratadine (CLARITIN) 10 MG tablet Take 10 mg by mouth daily.     Marland Kitchen UNABLE TO FIND Progesterone shot       I have reviewed patient's Past Medical Hx, Surgical Hx, Family Hx, Social Hx, medications and allergies.   ROS:  Review of Systems  Constitutional: Negative for chills and fever.  Gastrointestinal: Negative for constipation, diarrhea, nausea and vomiting.  Genitourinary: Positive for vaginal bleeding. Negative for pelvic pain.  Musculoskeletal: Negative for back pain.   Other systems negative  Physical Exam   Patient Vitals for the past 24 hrs:  BP Temp Temp src Pulse Resp SpO2 Height Weight  10/24/20 0832 110/67 98.2 F (36.8 C) Oral 90 16 100 % -- --  10/24/20 0829 -- -- -- -- -- -- 5\' 2"  (1.575 m) 58.3 kg   Constitutional: Well-developed, well-nourished female in no acute distress.  Cardiovascular: normal rate and rhythm Respiratory: normal effort, clear to auscultation bilaterally GI: Abd soft, non-tender, gravid appropriate for gestational age.   No rebound or guarding. MS: Extremities nontender, no edema, normal ROM Neurologic: Alert and oriented x 4.  GU: Neg CVAT.  PELVIC EXAM: Cervix pink, visually closed, without lesion, small to mod blood in vault, vaginal walls and external genitalia normal   FHT:  150   Labs: Results for orders placed or performed during the hospital encounter of 10/24/20 (from the past 24 hour(s))  Urinalysis, Routine w reflex microscopic Urine, Clean  Catch     Status: Abnormal   Collection Time: 10/24/20  8:37 AM  Result Value Ref Range   Color, Urine YELLOW YELLOW   APPearance CLEAR CLEAR   Specific Gravity, Urine 1.009 1.005 - 1.030   pH 6.0 5.0 - 8.0   Glucose, UA NEGATIVE NEGATIVE mg/dL   Hgb urine dipstick LARGE (A) NEGATIVE   Bilirubin Urine NEGATIVE NEGATIVE   Ketones, ur NEGATIVE NEGATIVE mg/dL   Protein, ur NEGATIVE NEGATIVE mg/dL   Nitrite NEGATIVE NEGATIVE   Leukocytes,Ua NEGATIVE NEGATIVE   RBC / HPF 6-10 0 - 5 RBC/hpf   WBC, UA 0-5 0 - 5 WBC/hpf   Bacteria, UA RARE (A) NONE SEEN   Squamous Epithelial / LPF 0-5 0 - 5    Imaging:  Posterior placenta previa Small subchorionic hemorrhage Marginal cord insertion  Cervix 3.2cm long  MAU Course/MDM: I have ordered an ultrasound and reviewed results. Discussed findings with patient.   Consult Dr Ronita Hipps ([facility] attending was in [place]) with presentation, exam findings and test results. He recommends discharge home with followup in office next week  Assessment: Single IUP at [redacted]w[redacted]d Vaginal bleeding in pregnancy Posterior placenta previa  Plan: Discharge home Bleeding precautions Pelvic rest Follow up in Office for prenatal visit next week  Encouraged to return if she develops worsening of symptoms, increase in pain, fever, or other concerning symptoms.   Pt stable at time of discharge.  Hansel Feinstein CNM, MSN Certified Nurse-Midwife 10/24/2020 8:49 AM

## 2020-10-24 NOTE — MAU Note (Signed)
Jacqueline [name] is a 29 y.o. at [redacted]w[redacted]d here in MAU reporting: started bleeding on Friday night, states it will taper off and then come back. Is wearing a pad but states not filling a pad. No pain. No recent IC.  Onset of complaint: Friday night  Pain score: 0/10  Vitals:   10/24/20 0832  BP: 110/67  Pulse: 90  Resp: 16  Temp: 98.2 F (36.8 C)  SpO2: 100%     Lab orders placed from triage: UA

## 2020-10-26 DIAGNOSIS — Z361 Encounter for antenatal screening for raised alphafetoprotein level: Secondary | ICD-10-CM | POA: Diagnosis not present

## 2020-12-29 ENCOUNTER — Other Ambulatory Visit: Payer: Self-pay | Admitting: Obstetrics and Gynecology

## 2020-12-29 DIAGNOSIS — Z3A26 26 weeks gestation of pregnancy: Secondary | ICD-10-CM

## 2020-12-29 DIAGNOSIS — O43212 Placenta accreta, second trimester: Secondary | ICD-10-CM

## 2020-12-29 DIAGNOSIS — Z363 Encounter for antenatal screening for malformations: Secondary | ICD-10-CM

## 2021-01-06 ENCOUNTER — Ambulatory Visit: Payer: BC Managed Care – PPO | Attending: Obstetrics and Gynecology

## 2021-01-06 ENCOUNTER — Ambulatory Visit: Payer: BC Managed Care – PPO | Admitting: *Deleted

## 2021-01-06 ENCOUNTER — Ambulatory Visit (HOSPITAL_BASED_OUTPATIENT_CLINIC_OR_DEPARTMENT_OTHER): Payer: BC Managed Care – PPO | Admitting: Obstetrics and Gynecology

## 2021-01-06 ENCOUNTER — Other Ambulatory Visit: Payer: Self-pay

## 2021-01-06 VITALS — BP 103/63 | HR 81

## 2021-01-06 DIAGNOSIS — Z363 Encounter for antenatal screening for malformations: Secondary | ICD-10-CM

## 2021-01-06 DIAGNOSIS — O4692 Antepartum hemorrhage, unspecified, second trimester: Secondary | ICD-10-CM | POA: Insufficient documentation

## 2021-01-06 DIAGNOSIS — O34219 Maternal care for unspecified type scar from previous cesarean delivery: Secondary | ICD-10-CM

## 2021-01-06 DIAGNOSIS — Z3A26 26 weeks gestation of pregnancy: Secondary | ICD-10-CM

## 2021-01-06 DIAGNOSIS — O4402 Placenta previa specified as without hemorrhage, second trimester: Secondary | ICD-10-CM | POA: Diagnosis present

## 2021-01-06 DIAGNOSIS — O43212 Placenta accreta, second trimester: Secondary | ICD-10-CM | POA: Insufficient documentation

## 2021-01-06 DIAGNOSIS — O09812 Supervision of pregnancy resulting from assisted reproductive technology, second trimester: Secondary | ICD-10-CM | POA: Diagnosis present

## 2021-01-06 DIAGNOSIS — O09892 Supervision of other high risk pregnancies, second trimester: Secondary | ICD-10-CM | POA: Diagnosis present

## 2021-01-06 NOTE — Progress Notes (Addendum)
Maternal-Fetal Medicine   Name: Jacqueline [name] DOB: Sep 12, 1991 MRN: [id number] Referring Provider: Brien Few. MD  I had the pleasure of seeing Jacqueline [name] today at the [facility].  She is G3 P1 at 26w 4d gestation and is here for a second-opinion ultrasound and consultation. On your office ultrasound, placental accreta spectrum (PAS) was suspected.   Obstetric history significant for a preterm cesarean delivery at [redacted] weeks gestation of a female infant.  Her pregnancy was complicated by placenta previa with vaginal bleeding.  Patient was admitted with [redacted] weeks gestation and received antenatal corticosteroids.  Her son was discharged after 42 days NICU.  GYN history: No history of abnormal Pap smears or cervical surgeries.  She gives history of endometriosis and had laparoscopic adhesiolysis.  No history of breast biopsies.  Her previous menstrual cycles were reportedly regular.  Past medical history: No history of diabetes or hypertension or any other chronic medical conditions. Past surgical history: Cesarean section, laparoscopy. Medications: Prenatal vitamins. Allergies: No known drug allergies.  Patient is allergic to shellfish. Social history: Denies tobacco or drug or alcohol use.  She has been married 6 years and her husband is in good health.  Family history: Mother had breast cancer and is now in good health.  No history of venous thromboembolism in the family.  Prenatal course.  On cell free fetal DNA screening, the risks of fetal aneuploidies are not increased.  MSAFP screening showed low risk for open neural tube defects.  Patient had one episode of vaginal bleeding in this pregnancy.  A small subchorionic hemorrhage was noted in early pregnancy.  Blood pressure today at her office is 103/63 mmHg.  Ultrasound We performed fetal anatomy scan. No makers of aneuploidies or fetal structural defects are seen. Fetal biometry is consistent with her previously-established  dates. Amniotic fluid is normal and good fetal activity is seen. Patient understands the limitations of ultrasound in detecting fetal anomalies.  On transabdominal scan, posterior placenta previa covering the internal os was seen after explaining, we performed a transvaginal ultrasound with partially full bladder.  The following findings were seen: -No irregular placental lacunae were seen. -Bladder wall appears smooth and there is no invasion of placenta. Myometrial-bladder interface appears normal. -Color-Doppler flow showed no evidence of "bridging vessels." -No evidence of myometrial thinning or abnormal vascularity from the placenta into the myometrium. -Placenta appears normal with no evidence of bulging. -The cervix appears normal. We could not see any placental invasion into the cervix or parametrium. Impression: NO evidence of placenta accreta spectrum (PAS).  Our concerns include: Placenta previa and rule out PAS -I explained the findings with ultrasound images and diagrams. -Previous cesarean delivery and placenta previa increases the risk of PAS (10% risk). -Posterior placenta previa and PAS could be challenging and ultrasound has limitations in diagnosing parametrial invasions. -MRI is a useful adjunct in posterior placenta in detecting parametrial invasion. However, MRI is also associated with false positive findings in some cases. -I recommend MRI in 4 weeks to rule out PAS (will discuss with Dr. Ronita Hipps on the timing of MRI). -If MRI rules out PAS, the patient can deliver at [facility].  -Since PAS is an evolving condition, a follow-up ultrasound in 4 weeks is advised (or earlier if patient has significant vaginal bleeding).  I will discuss the findings and recommendations with Dr. Ronita Hipps.  Recommendations -An appointment was made for her to return in 4 weeks for completion of fetal anatomy (spine) and to  evaluate the placenta. Patient is aware that  transvaginal ultrasound is recommended. -MRI in 4 weeks to rule out PAS. -If PAS is suspected on MRI, patient is likely to be transferred to a [facility] for further management. -If PAS is ruled out on MRI, the patient can deliver at [facility]. -Blood should be available at short notice for transfusion and consent for cesarean hysterectomy should be taken. -Delivery at 37 weeks' gestation.    Thank you for consultation. Please contact me if you have any questions.  Consultation including face-to-face counseling 30 minutes.

## 2021-01-07 ENCOUNTER — Other Ambulatory Visit: Payer: Self-pay | Admitting: *Deleted

## 2021-01-07 DIAGNOSIS — Z362 Encounter for other antenatal screening follow-up: Secondary | ICD-10-CM

## 2021-01-19 ENCOUNTER — Other Ambulatory Visit: Payer: Self-pay

## 2021-01-19 ENCOUNTER — Encounter: Payer: Self-pay | Admitting: Registered"

## 2021-01-19 ENCOUNTER — Encounter: Payer: BC Managed Care – PPO | Attending: Obstetrics and Gynecology | Admitting: Registered"

## 2021-01-19 DIAGNOSIS — O24419 Gestational diabetes mellitus in pregnancy, unspecified control: Secondary | ICD-10-CM | POA: Diagnosis present

## 2021-01-19 NOTE — Progress Notes (Signed)
Patient was seen on 01/19/21 for Gestational Diabetes self-management class at the [facility]. Patient states she has a degree in nutrition and has a good understanding of healthy eating.  The following learning objectives were met by the patient during this course:   States the definition of Gestational Diabetes  States why dietary management is important in controlling blood glucose  Describes the effects each nutrient has on blood glucose levels  Demonstrates ability to create a balanced meal plan  Demonstrates carbohydrate counting   States when to check blood glucose levels  Demonstrates proper blood glucose monitoring techniques  States the effect of stress and exercise on blood glucose levels  States the importance of limiting caffeine and abstaining from alcohol and smoking  Blood glucose monitor given: Acc[employer] #[id number] Exp: 12/26/2021 CBG: 100 mg/dL  Patient instructed to monitor glucose levels: FBS: 60 - <95; 1 hour: <140; 2 hour: <120  Patient received handouts:  Nutrition Diabetes and Pregnancy, including carb counting list  Patient will be seen for follow-up as needed.

## 2021-01-26 ENCOUNTER — Other Ambulatory Visit: Payer: Self-pay

## 2021-01-26 ENCOUNTER — Ambulatory Visit: Payer: BC Managed Care – PPO | Attending: Obstetrics and Gynecology

## 2021-01-26 ENCOUNTER — Ambulatory Visit: Payer: BC Managed Care – PPO | Admitting: *Deleted

## 2021-01-26 ENCOUNTER — Encounter: Payer: Self-pay | Admitting: *Deleted

## 2021-01-26 VITALS — BP 107/64 | HR 77

## 2021-01-26 DIAGNOSIS — O4403 Placenta previa specified as without hemorrhage, third trimester: Secondary | ICD-10-CM

## 2021-01-26 DIAGNOSIS — Z3A29 29 weeks gestation of pregnancy: Secondary | ICD-10-CM

## 2021-01-26 DIAGNOSIS — O09213 Supervision of pregnancy with history of pre-term labor, third trimester: Secondary | ICD-10-CM

## 2021-01-26 DIAGNOSIS — Z362 Encounter for other antenatal screening follow-up: Secondary | ICD-10-CM | POA: Insufficient documentation

## 2021-01-26 DIAGNOSIS — O34219 Maternal care for unspecified type scar from previous cesarean delivery: Secondary | ICD-10-CM

## 2021-01-26 DIAGNOSIS — O4400 Placenta previa specified as without hemorrhage, unspecified trimester: Secondary | ICD-10-CM

## 2021-01-26 DIAGNOSIS — O24410 Gestational diabetes mellitus in pregnancy, diet controlled: Secondary | ICD-10-CM

## 2021-01-26 DIAGNOSIS — O09813 Supervision of pregnancy resulting from assisted reproductive technology, third trimester: Secondary | ICD-10-CM

## 2021-01-26 DIAGNOSIS — O322XX0 Maternal care for transverse and oblique lie, not applicable or unspecified: Secondary | ICD-10-CM

## 2021-01-27 ENCOUNTER — Other Ambulatory Visit: Payer: Self-pay | Admitting: *Deleted

## 2021-01-27 DIAGNOSIS — O4403 Placenta previa specified as without hemorrhage, third trimester: Secondary | ICD-10-CM

## 2021-02-23 ENCOUNTER — Other Ambulatory Visit: Payer: Self-pay | Admitting: Obstetrics and Gynecology

## 2021-02-23 ENCOUNTER — Ambulatory Visit: Payer: BC Managed Care – PPO

## 2021-03-03 ENCOUNTER — Other Ambulatory Visit: Payer: Self-pay | Admitting: Obstetrics and Gynecology

## 2021-03-03 ENCOUNTER — Encounter (HOSPITAL_COMMUNITY): Payer: Self-pay

## 2021-03-03 DIAGNOSIS — O4403 Placenta previa specified as without hemorrhage, third trimester: Secondary | ICD-10-CM

## 2021-03-03 DIAGNOSIS — O99013 Anemia complicating pregnancy, third trimester: Secondary | ICD-10-CM

## 2021-03-03 NOTE — Patient Instructions (Addendum)
Moriah Shawley  03/03/2021   Your procedure is scheduled on:  03/17/2021  Arrive at [place] at Entrance C on [address] at [facility]  and [address]. You are invited to use the FREE valet parking or use the Visitor's parking deck.  Pick up the phone at the desk and dial [phone].  Call this number if you have problems the morning of surgery: [phone]  Remember:   Do not eat food:(After Midnight) Desps de medianoche.  Do not drink clear liquids: (After Midnight) Desps de medianoche.  Take these medicines the morning of surgery with A SIP OF WATER:  None may take zyrtec if desired   Do not wear jewelry, make-up or nail polish.  Do not wear lotions, powders, or perfumes. Do not wear deodorant.  Do not shave 48 hours prior to surgery.  Do not bring valuables to the hospital.  [facility] is not   responsible for any belongings or valuables brought to the hospital.  Contacts, dentures or bridgework may not be worn into surgery.  Leave suitcase in the car. After surgery it may be brought to your room.  For patients admitted to the hospital, checkout time is 11:00 AM the day of              discharge.      Please read over the following fact sheets that you were given:     Preparing for Surgery

## 2021-03-07 ENCOUNTER — Inpatient Hospital Stay (HOSPITAL_COMMUNITY)
Admission: RE | Admit: 2021-03-07 | Discharge: 2021-03-07 | Disposition: A | Discharge status: Home / Self Care | Payer: BC Managed Care – PPO | Source: Ambulatory Visit

## 2021-03-07 ENCOUNTER — Inpatient Hospital Stay (HOSPITAL_COMMUNITY)
Admission: AD | Admit: 2021-03-07 | Discharge: 2021-03-14 | DRG: 787 | Disposition: A | Discharge status: Home / Self Care | Payer: BC Managed Care – PPO | Attending: Obstetrics | Admitting: Obstetrics

## 2021-03-07 ENCOUNTER — Other Ambulatory Visit: Payer: Self-pay

## 2021-03-07 ENCOUNTER — Encounter (HOSPITAL_COMMUNITY): Payer: Self-pay | Admitting: Obstetrics

## 2021-03-07 DIAGNOSIS — O99013 Anemia complicating pregnancy, third trimester: Secondary | ICD-10-CM

## 2021-03-07 DIAGNOSIS — O24420 Gestational diabetes mellitus in childbirth, diet controlled: Secondary | ICD-10-CM | POA: Diagnosis present

## 2021-03-07 DIAGNOSIS — O99892 Other specified diseases and conditions complicating childbirth: Secondary | ICD-10-CM | POA: Diagnosis present

## 2021-03-07 DIAGNOSIS — O4413 Placenta previa with hemorrhage, third trimester: Principal | ICD-10-CM | POA: Diagnosis present

## 2021-03-07 DIAGNOSIS — O4403 Placenta previa specified as without hemorrhage, third trimester: Secondary | ICD-10-CM | POA: Diagnosis present

## 2021-03-07 DIAGNOSIS — D62 Acute posthemorrhagic anemia: Secondary | ICD-10-CM | POA: Diagnosis not present

## 2021-03-07 DIAGNOSIS — O34211 Maternal care for low transverse scar from previous cesarean delivery: Secondary | ICD-10-CM | POA: Diagnosis present

## 2021-03-07 DIAGNOSIS — O9081 Anemia of the puerperium: Secondary | ICD-10-CM | POA: Diagnosis not present

## 2021-03-07 DIAGNOSIS — O328XX0 Maternal care for other malpresentation of fetus, not applicable or unspecified: Secondary | ICD-10-CM | POA: Diagnosis present

## 2021-03-07 DIAGNOSIS — Z20822 Contact with and (suspected) exposure to covid-19: Secondary | ICD-10-CM | POA: Diagnosis present

## 2021-03-07 DIAGNOSIS — N809 Endometriosis, unspecified: Secondary | ICD-10-CM | POA: Diagnosis present

## 2021-03-07 DIAGNOSIS — Z3A35 35 weeks gestation of pregnancy: Secondary | ICD-10-CM

## 2021-03-07 LAB — URINALYSIS, ROUTINE W REFLEX MICROSCOPIC
Bilirubin Urine: NEGATIVE
Glucose, UA: NEGATIVE mg/dL
Ketones, ur: NEGATIVE mg/dL
Leukocytes,Ua: NEGATIVE
Nitrite: NEGATIVE
Protein, ur: NEGATIVE mg/dL
RBC / HPF: >50 RBC/hpf — ABNORMAL HIGH (ref 0–5)
Specific Gravity, Urine: 1.009 (ref 1.005–1.030)
pH: 6 (ref 5.0–8.0)

## 2021-03-07 LAB — GLUCOSE, CAPILLARY
Glucose-Capillary: 87 mg/dL (ref 70–99)
Glucose-Capillary: 144 mg/dL — ABNORMAL HIGH (ref 70–99)
Glucose-Capillary: 106 mg/dL — ABNORMAL HIGH (ref 70–99)

## 2021-03-07 LAB — CBC
HCT: 32.3 % — ABNORMAL LOW (ref 36.0–46.0)
Hemoglobin: 10.8 g/dL — ABNORMAL LOW (ref 12.0–15.0)
MCH: 31.5 pg (ref 26.0–34.0)
MCHC: 33.4 g/dL (ref 30.0–36.0)
MCV: 94.2 fL (ref 80.0–100.0)
Platelets: 260 10*3/uL (ref 150–400)
RBC: 3.43 MIL/uL — ABNORMAL LOW (ref 3.87–5.11)
RDW: 12.6 % (ref 11.5–15.5)
WBC: 9.4 10*3/uL (ref 4.0–10.5)
nRBC: 0 % (ref 0.0–0.2)

## 2021-03-07 LAB — TYPE AND SCREEN
ABO/RH(D): A POS
Antibody Screen: NEGATIVE

## 2021-03-07 LAB — RESP PANEL BY RT-PCR (FLU A&B, COVID) ARPGX2
Influenza A by PCR: NEGATIVE
Influenza B by PCR: NEGATIVE
SARS Coronavirus 2 by RT PCR: NEGATIVE

## 2021-03-07 LAB — DIC (DISSEMINATED INTRAVASCULAR COAGULATION)PANEL
D-Dimer, Quant: 1.32 ug/mL-FEU — ABNORMAL HIGH (ref 0.00–0.50)
Fibrinogen: 546 mg/dL — ABNORMAL HIGH (ref 210–475)
INR: 1 (ref 0.8–1.2)
Platelets: 247 10*3/uL (ref 150–400)
Prothrombin Time: 12.7 seconds (ref 11.4–15.2)
Smear Review: NONE SEEN
aPTT: 24 seconds (ref 24–36)

## 2021-03-07 MED ORDER — PRENATAL MULTIVITAMIN CH
1.0000 | ORAL_TABLET | Freq: Every day | ORAL | Status: DC
  Administered 2021-03-07 – 2021-03-11 (×5): 1 via ORAL
  Filled 2021-03-07 (×5): qty 1

## 2021-03-07 MED ORDER — DIPHENHYDRAMINE HCL 25 MG PO CAPS
25.0000 mg | ORAL_CAPSULE | ORAL | Status: DC
Start: 2021-03-07 — End: 2021-03-12
  Administered 2021-03-07: 25 mg via ORAL
  Filled 2021-03-07 (×2): qty 1

## 2021-03-07 MED ORDER — ACETAMINOPHEN 325 MG PO TABS
650.0000 mg | ORAL_TABLET | ORAL | Status: DC | PRN
  Administered 2021-03-08 – 2021-03-11 (×4): 650 mg via ORAL
  Filled 2021-03-07 (×5): qty 2

## 2021-03-07 MED ORDER — NIFEDIPINE 10 MG PO CAPS
20.0000 mg | ORAL_CAPSULE | Freq: Four times a day (QID) | ORAL | Status: DC
  Administered 2021-03-07 – 2021-03-10 (×16): 20 mg via ORAL
  Filled 2021-03-07 (×16): qty 2

## 2021-03-07 MED ORDER — LACTATED RINGERS IV SOLN: INTRAVENOUS | Status: DC

## 2021-03-07 MED ORDER — ACETAMINOPHEN 500 MG PO TABS
1000.0000 mg | ORAL_TABLET | ORAL | Status: DC
  Administered 2021-03-07: 1000 mg via ORAL
  Filled 2021-03-07: qty 2

## 2021-03-07 MED ORDER — DOCUSATE SODIUM 100 MG PO CAPS
100.0000 mg | ORAL_CAPSULE | Freq: Every day | ORAL | Status: DC
  Administered 2021-03-07 – 2021-03-11 (×5): 100 mg via ORAL
  Filled 2021-03-07 (×5): qty 1

## 2021-03-07 MED ORDER — CALCIUM CARBONATE ANTACID 500 MG PO CHEW: 2.0000 | CHEWABLE_TABLET | ORAL | Status: DC | PRN

## 2021-03-07 MED ORDER — SODIUM CHLORIDE 0.9 % IV SOLN: INTRAVENOUS | Status: DC

## 2021-03-07 MED ORDER — SODIUM CHLORIDE 0.9 % IV SOLN
500.0000 mg | INTRAVENOUS | Status: DC
  Administered 2021-03-07: 500 mg via INTRAVENOUS
  Filled 2021-03-07: qty 25

## 2021-03-07 MED ORDER — ZOLPIDEM TARTRATE 5 MG PO TABS: 5.0000 mg | ORAL_TABLET | Freq: Every evening | ORAL | Status: DC | PRN

## 2021-03-07 MED ORDER — BETAMETHASONE SOD PHOS & ACET 6 (3-3) MG/ML IJ SUSP
12.0000 mg | INTRAMUSCULAR | Status: AC
  Administered 2021-03-07 – 2021-03-08 (×2): 12 mg via INTRAMUSCULAR
  Filled 2021-03-07 (×2): qty 5

## 2021-03-07 NOTE — MAU Note (Signed)
..  Jacqueline [name] is a 30 y.o. at [redacted]w[redacted]d here in MAU reporting: vaginal bleeding and abdominal cramping. The vaginal bleeding began around 45 minutes ago. She stated she was in bed, shifted her position, and felt a small gush. When she went to the restroom she noticed it was blood and it was all over the toilet and her underwear. Reports abdominal cramping that she noticed shortly after the bleeding began. +FM.  Pain score: 3/10 Vitals:   03/07/21 0344  BP: 122/81  Pulse: 97  Resp: 19  Temp: 98 F (36.7 C)  SpO2: 100%     FHT:130 Lab orders placed from triage: UA

## 2021-03-07 NOTE — Progress Notes (Signed)
S: Pt notes occasional cramping, no ctx, no further bleeding, no LOF, good FM. Asking to eat  O Vitals:   03/07/21 0638 03/07/21 0753  BP: 101/62 100/62  Pulse: (!) 106 92  Resp: 18 18  Temp: 98 F (36.7 C) 98.4 F (36.9 C)  SpO2: 100% 99%   Gen: well appearing, no distress Abd: gravid, no fundal tenderness GU: no bleeding, old staining on pad LE: NT, no edema  Toco: irritibility. FH 135s, + accels, no decels, 10 beat var  CBC    Component Value Date/Time   WBC 9.4 03/07/2021 0433   RBC 3.43 (L) 03/07/2021 0433   HGB 10.8 (L) 03/07/2021 0433   HCT 32.3 (L) 03/07/2021 0433   PLT 247 03/07/2021 0433   PLT 260 03/07/2021 0433   MCV 94.2 03/07/2021 0433   MCV 96.7 04/08/2013 0943   MCH 31.5 03/07/2021 0433   MCHC 33.4 03/07/2021 0433   RDW 12.6 03/07/2021 0433   LYMPHSABS 2.9 10/03/2018 0528   MONOABS 0.7 10/03/2018 0528   EOSABS 0.7 (H) 10/03/2018 0528   BASOSABS 0.0 10/03/2018 0528   INR 1.0 Fib 546  A/P: 35'1 placenta previa with bleed this am, now with irritibility but no further bleeding, no ctx, reactive fetal testing. - close monitoring, continuous NST/ toco. NPO til early afternoon, would wait until done with IV iron due to risk of infusion. Bed rest. Procardia tocolyis. RCS with NRFHT, active labor, new bleeding - BMZ -GDM, check BS -R/B RCS d/w pt, unsue about TL, states planning hysterectomy with gyn and general surgeon due to bowel adhesions (dense to post uterus per pt)  Ala Dach 03/07/2021 10:04 AM

## 2021-03-07 NOTE — MAU Provider Note (Signed)
History     CSN: [id number]  Arrival date and time: 03/07/21 7062   Event Date/Time   First Provider Initiated Contact with Patient 03/07/21 0407      Chief Complaint  Patient presents with  . Vaginal Bleeding   Jacqueline [name] is a 30 y.o. [id number] at [redacted]w[redacted]d who receives care at [facility].  She presents today for Vaginal Bleeding.  She has known previa and reports she woke up about 45 minutes ago with bleeding.  She reports she noted blood in the toilet and on her pad.  She reports passing "tiny tiny" clots. She also reports onset of cramping.  She endorses fetal movement and denies contractions. She reports she had a MRI, but accreta was not appreciated.    OB History    Gravida  3   Para  1   Term  0   Preterm  1   AB  1   Living  1     SAB  1   IAB  0   Ectopic  0   Multiple      Live Births  1           Past Medical History:  Diagnosis Date  . Allergic rhinitis   . Allergy   . Asthma   . Endometriosis   . Placenta previa   . Thyroid nodule     Past Surgical History:  Procedure Laterality Date  . CESAREAN SECTION N/A 09/29/2018   Procedure: CESAREAN SECTION;  Surgeon: Brien Few, MD;  Location: [facility];  Service: Obstetrics;  Laterality: N/A;  . CHROMOPERTUBATION Bilateral 03/20/2017   Procedure: CHROMOPERTUBATION;  Surgeon: Megan Salon, MD;  Location: [facility];  Service: Gynecology;  Laterality: Bilateral;  Fallopian tubes  . CYSTOSCOPY N/A 03/20/2017   Procedure: CYSTOSCOPY;  Surgeon: Megan Salon, MD;  Location: [facility];  Service: Gynecology;  Laterality: N/A;  . FOOT SURGERY Left    neuromona  . LAPAROSCOPIC OVARIAN CYSTECTOMY Bilateral 03/20/2017   Procedure: LAPAROSCOPIC OVARIAN CYSTECTOMY;  Surgeon: Megan Salon, MD;  Location: [facility];  Service: Gynecology;  Laterality: Bilateral;  . LAPAROSCOPIC UNILATERAL SALPINGECTOMY Right 03/20/2017   Procedure: LAPAROSCOPIC UNILATERAL SALPINGECTOMY;  Surgeon: Megan Salon, MD;   Location: [facility];  Service: Gynecology;  Laterality: Right;  . LAPAROSCOPY Bilateral 03/20/2017   Procedure: LAPAROSCOPY OPERATIVE  WITH LYSIS OF ADHESIONS;  Surgeon: Megan Salon, MD;  Location: [facility];  Service: Gynecology;  Laterality: Bilateral;  . WISDOM TOOTH EXTRACTION  03/28/2013  . WISDOM TOOTH EXTRACTION      Family History  Problem Relation Age of Onset  . Breast cancer Mother 34        mastectomy, negative genetic testing  . Thyroid disease Neg Hx   . Cancer Mother     Social History   Tobacco Use  . Smoking status: Never Smoker  . Smokeless tobacco: Never Used  Vaping Use  . Vaping Use: Never used  Substance Use Topics  . Alcohol use: Not Currently  . Drug use: No    Allergies:  Allergies  Allergen Reactions  . Shellfish Allergy Anaphylaxis  . Augmentin [Amoxicillin-Pot Clavulanate]     childhood  . Ciprofloxacin     Caused QT prolongation  . Other   . Wound Dressing Adhesive     Had a cellulitis reaction.  Unsure if it was drape or dressing related  . Clavulanic Acid Rash    Had rash with Augmentin as a child but  tolerates amoxicillin;     Medications Prior to Admission  Medication Sig Dispense Refill Last Dose  . Prenatal Vit-Fe Fumarate-FA (PRENATAL VITAMIN PO) Take by mouth.   03/06/2021 at Unknown time  . acetaminophen (TYLENOL) 325 MG tablet Take 650 mg by mouth every 6 (six) hours as needed for moderate pain.     . clobetasol ointment (TEMOVATE) 0.05 % topical clobetasol 0.05% ointment twice daily for 2 weeks. (Patient not taking: Reported on 01/26/2021) 30 g 0   . diphenhydrAMINE (BENADRYL) 25 mg capsule Take 1 capsule (25 mg total) by mouth every 6 (six) hours as needed for itching. 30 capsule 0   . estradiol (VIVELLE-DOT) 0.1 MG/24HR patch SMARTSIG:1 Patch(s) T-DERMAL Every 3 Days (Patient not taking: Reported on 01/26/2021)     . loratadine (CLARITIN) 10 MG tablet Take 10 mg by mouth daily.     Marland Kitchen UNABLE TO FIND Progesterone shot       Review of  Systems  Genitourinary: Positive for vaginal bleeding. Negative for difficulty urinating and dysuria.   Physical Exam   Blood pressure 122/81, pulse 97, temperature 98 F (36.7 C), temperature source Oral, resp. rate 19, height 5\' 2"  (1.575 m), weight 69.4 kg, last menstrual period 07/05/2020, SpO2 100 %.  Physical Exam Vitals reviewed. Exam conducted with a chaperone present.  Constitutional:      Appearance: Normal appearance.  HENT:     Head: Normocephalic and atraumatic.  Eyes:     Conjunctiva/sclera: Conjunctivae normal.  Cardiovascular:     Rate and Rhythm: Normal rate.  Pulmonary:     Effort: Pulmonary effort is normal. No respiratory distress.  Abdominal:     General: Bowel sounds are normal.     Palpations: Abdomen is soft.     Tenderness: There is no abdominal tenderness.     Comments: Gravid, Soft, Mild Ctx palpated  Genitourinary:    Comments: Sterile Speculum Exam: -Normal External Genitalia: Non tender, no apparent discharge or blood noted at introitus.  -Vaginal Vault: Pink mucosa with good rugae. Moderate amt blood in vault; Removed with faux swab x 4. Clot noted ~ quarter sized.  -Cervix: Not well visualized due to blood.   -Bimanual Exam:  Deferred  Musculoskeletal:     Cervical back: Normal range of motion.  Skin:    General: Skin is warm and dry.  Neurological:     Mental Status: She is alert and oriented to person, place, and time.  Psychiatric:        Mood and Affect: Mood normal.        Behavior: Behavior normal.        Thought Content: Thought content normal.     Fetal Assessment 135 bpm, Mod Var, -Decels, +Accels Toco: Occasional, mild with irritability  MAU Course  No results found for this or any previous visit (from the past 24 hour(s)). No results found.  MDM PE Labs: CBC, DIC Panel, Covid EFM Assessment and Plan  30 year old G3P0111  SIUP at 35.1weeks Cat I FT Placenta Previa with Hemorrhage  -Exam performed and findings  discussed. -Provider informs patient that she will be staying, but decision for observation vs delivery will be determined by her primary ob. -Nurse instructed to start IV and obtain admission labs.  Also instructed to obtain and send Covid test.  -Dr. Jerral Bonito called and informed of patient status and evaluation, Questions if patient has received BMZ dosing. -Provider to bedside and patient denies receiving steroid injections stating it is scheduled  for next week. -Dr. Pamala Hurry updated and requests that coags be added to blood draw. Reports that she is considering delivery of patient. -Nurse informed of request.  -Care assumed by Dr. Carmela Hurt MSN, CNM 03/07/2021, 4:07 AM

## 2021-03-07 NOTE — H&P (Addendum)
Jacqueline [name] is a 30 y.o. [phone] at [redacted]w[redacted]d presenting for vaginal bleeding in the setting of known placenta previa. Pt notes no contractions but does feel intermittent menstrual-like cramping. Good fetal movement, vaginal bleeding, Started around 3 AM.  Patient woke up thinking she was voiding to note blood in the toilet.  She then passed a small clot but no continued active bleeding.cannot determine if she is leaking fluid due to the bleeding.  PNCare at [facility] since first trimester -IVF pregnancy, dated by IVF dating -History of endometriosis, multiple prior surgeries -Placenta previa.  Complete previa.  History of previa in the prior pregnancy needing oversew for questionable small area of accreta.  MRI this pregnancy indeterminant with no clear evidence of placental accreta spectrum. Gestational diabetes.  Patient notes initial compliance with normal blood sugars but has not been checking blood sugars Shellfish allergy  Prenatal Transfer Tool  Maternal Diabetes: Yes:  Diabetes Type:  Diet controlled Genetic Screening: Normal Maternal Ultrasounds/Referrals: Normal Fetal Ultrasounds or other Referrals:  Referred to Materal Fetal Medicine  Maternal Substance Abuse:  No Significant Maternal Medications:  None Significant Maternal Lab Results: None     OB History    Gravida  3   Para  1   Term  0   Preterm  1   AB  1   Living  1     SAB  1   IAB  0   Ectopic  0   Multiple      Live Births  1          Past Medical History:  Diagnosis Date  . Allergic rhinitis   . Allergy   . Asthma   . Endometriosis   . Placenta previa   . Thyroid nodule    Past Surgical History:  Procedure Laterality Date  . CESAREAN SECTION N/A 09/29/2018   Procedure: CESAREAN SECTION;  Surgeon: Brien Few, MD;  Location: [place];  Service: Obstetrics;  Laterality: N/A;  . CHROMOPERTUBATION Bilateral 03/20/2017   Procedure: CHROMOPERTUBATION;  Surgeon: Megan Salon, MD;  Location: [facility];  Service: Gynecology;  Laterality: Bilateral;  Fallopian tubes  . CYSTOSCOPY N/A 03/20/2017   Procedure: CYSTOSCOPY;  Surgeon: Megan Salon, MD;  Location: [facility];  Service: Gynecology;  Laterality: N/A;  . FOOT SURGERY Left    neuromona  . LAPAROSCOPIC OVARIAN CYSTECTOMY Bilateral 03/20/2017   Procedure: LAPAROSCOPIC OVARIAN CYSTECTOMY;  Surgeon: Megan Salon, MD;  Location: [facility];  Service: Gynecology;  Laterality: Bilateral;  . LAPAROSCOPIC UNILATERAL SALPINGECTOMY Right 03/20/2017   Procedure: LAPAROSCOPIC UNILATERAL SALPINGECTOMY;  Surgeon: Megan Salon, MD;  Location: [facility];  Service: Gynecology;  Laterality: Right;  . LAPAROSCOPY Bilateral 03/20/2017   Procedure: LAPAROSCOPY OPERATIVE  WITH LYSIS OF ADHESIONS;  Surgeon: Megan Salon, MD;  Location: [facility];  Service: Gynecology;  Laterality: Bilateral;  . WISDOM TOOTH EXTRACTION  03/28/2013  . WISDOM TOOTH EXTRACTION     Family History: family history includes Breast cancer (age of onset: 19) in her mother; Cancer in her mother. Social History:  reports that she has never smoked. She has never used smokeless tobacco. She reports previous alcohol use. She reports that she does not use drugs.  Review of Systems - Negative except Bleeding and menstrual type cramping     Blood pressure 122/81, pulse 97, temperature 98 F (36.7 C), temperature source Oral, resp. rate 19, height 5\' 2"  (1.575 m), weight 69.4 kg, last menstrual period 07/05/2020, SpO2 100 %.  Physical Exam:  Gen: well appearing, no distress Abd: gravid, NT, no RUQ pain LE: No edema, equal bilaterally, non-tender Toco: Irregular, mild contractions every 4 to 8 minutes FH: baseline 130s, accelerations present, no deceleratons, 10 beat variability  Prenatal labs: ABO, Rh:  A positive Antibody:  Negative Rubella:  Immune RPR:   Nonreactive HBsAg:   Negative HIV:   Negative GBS:   Not yet done 1 hr Glucola 147  Genetic screening  normal nips Anatomy US normal anatomy, complete previa   Assessment/Plan: 30 y.o. [id number] at [redacted]w[redacted]d Placenta previa now with first bleeding.  Seems to have stopped with no active bleeding from the cervix, reactive fetal status and no uterine pain.  Contractions persist but patient just now finishing her first liter of IV fluids and will give 20 mg of Procardia for toco lysis.  Risk-benefit of delivery versus expectant management discussed with patient.  Betamethasone given now and will attempt to defer delivery till steroid complete however as patient already 35 weeks, only limited benefit to [place] and  should any significant bleeding return, contractions persist, or any nonreassuring fetal status will move to cesarean section.  We will keep patient n.p.o. for now as this is early in her assessment.  -Reactive fetal testing -History of anemia.  Was planning IV iron transfusion.  We will continue with that plan.  Patient consents to blood if needed.  Admission CBC and coags pending.   Ala Dach 03/07/2021 5:21 AM    Ala Dach 03/07/2021, 5:11 AM  SSE done at time of initial exam: 1 scopette bloody fluid in vagina, no active bleeding, cvx high in vagina and difficult to visualize due to pt's poor tolerance of exam and convergent vaginal walls. Digital exam not done No fundal tenderness  No longer bleeding, no longer feeling cramping. Will keep NPO, admit to ante for close observation.  Ala Dach 03/07/2021 6:07 AM

## 2021-03-08 LAB — GLUCOSE, CAPILLARY
Glucose-Capillary: 132 mg/dL — ABNORMAL HIGH (ref 70–99)
Glucose-Capillary: 141 mg/dL — ABNORMAL HIGH (ref 70–99)
Glucose-Capillary: 150 mg/dL — ABNORMAL HIGH (ref 70–99)
Glucose-Capillary: 112 mg/dL — ABNORMAL HIGH (ref 70–99)

## 2021-03-08 NOTE — Progress Notes (Addendum)
This am had dark red when wiping x1, minimal amt on pad; felt cramping this am when procardia was due then resolved, hasn't felt ctx; no lof; +FM; asking about delivery planning  Patient Vitals for the past 24 hrs:  BP Temp Temp src Pulse Resp SpO2 Weight  03/08/21 0723 110/70 98.2 F (36.8 C) Oral (!) 107 18 98 % --  03/08/21 0446 -- -- -- -- -- -- 70.8 kg  03/08/21 0443 109/65 98.2 F (36.8 C) Oral 92 17 100 % --  03/07/21 2236 (!) 98/59 98.1 F (36.7 C) Oral 81 18 97 % --  03/07/21 2005 109/70 98.4 F (36.9 C) Oral 90 19 99 % --  03/07/21 1606 106/68 98.1 F (36.7 C) Oral 88 18 99 % --  03/07/21 1117 101/61 98.2 F (36.8 C) Oral 86 18 98 % --  03/07/21 0753 100/62 98.4 F (36.9 C) Oral 92 18 99 % --   A&ox3 nml respirations Abd: soft, nt,n gravid LE: no edema, nt bilat  FHT: 120s, nml variability, + accels, no decels TOCO: some uterine irritability, long periods with min ctx, did have about hr or so with more frequent ctx this am  A/P: iup at 35.2 wga 1. Complete previa with vb - first bleed, no further bleeding, scant old blood from bleeding yesterday but will contin to follow closely; plan to deliver by rltcs at 62 wga (unsure for TL), already scheduled unless change in patient/fetal status; contin regular diet (plan discussed with Dr Ronita Hipps who has also spoken with MFM); pt can discuss further with Dr Ronita Hipps tomorrow when he is on call 2. Prematurity - s/p second bmz today 3. Fetal status reassuring - plan for 1 hr monitoring q shift 4. GDM - fasting this am 132, yesterday pp 87/144/106 - elevation d/t bmz, continue diabetic diet and will contin monitoring 5. gbs pending

## 2021-03-09 LAB — GLUCOSE, CAPILLARY
Glucose-Capillary: 101 mg/dL — ABNORMAL HIGH (ref 70–99)
Glucose-Capillary: 100 mg/dL — ABNORMAL HIGH (ref 70–99)
Glucose-Capillary: 101 mg/dL — ABNORMAL HIGH (ref 70–99)
Glucose-Capillary: 92 mg/dL (ref 70–99)

## 2021-03-09 NOTE — Progress Notes (Signed)
HD 3 Previa  S: No complaints Brown spotting continues No BRB. Good FM. No LOF.  Rare contractions. O: BP 104/68 (BP Location: Left Arm)   Pulse (!) 110   Temp 98 F (36.7 C) (Oral)   Resp 18   Ht 5\' 2"  (1.575 m)   Wt 70.8 kg   LMP 07/05/2020 (Within Weeks)   SpO2 98%   BMI 28.53 kg/m   NCAT Neck: supple with FROM Lungs: cTA CV:RRR No CVAT ABD: gravid , NT VE: deferred EXT: SCDs intact CBC    Component Value Date/Time   WBC 9.4 03/07/2021 0433   RBC 3.43 (L) 03/07/2021 0433   HGB 10.8 (L) 03/07/2021 0433   HCT 32.3 (L) 03/07/2021 0433   PLT 247 03/07/2021 0433   PLT 260 03/07/2021 0433   MCV 94.2 03/07/2021 0433   MCV 96.7 04/08/2013 0943   MCH 31.5 03/07/2021 0433   MCHC 33.4 03/07/2021 0433   RDW 12.6 03/07/2021 0433   LYMPHSABS 2.9 10/03/2018 0528   MONOABS 0.7 10/03/2018 0528   EOSABS 0.7 (H) 10/03/2018 0528   BASOSABS 0.0 10/03/2018 0528   FHR 150s with accels. No decels. Rare contractions. Cat 1.  IMP: 35w 3d IUP Previa- stable. Had spotting at 15. First bleeding since that time. No evidence of accreta on sono or MRI Anemia- s/p Fe infusion Previous csection for previa with focal accreta  P: Continue inpt at this time Csection timing discussed. Continue tid monitoring  Consider dc procardia now that BMZ completed.

## 2021-03-10 LAB — GLUCOSE, CAPILLARY
Glucose-Capillary: 80 mg/dL (ref 70–99)
Glucose-Capillary: 104 mg/dL — ABNORMAL HIGH (ref 70–99)
Glucose-Capillary: 103 mg/dL — ABNORMAL HIGH (ref 70–99)
Glucose-Capillary: 96 mg/dL (ref 70–99)

## 2021-03-10 LAB — CULTURE, BETA STREP (GROUP B ONLY)

## 2021-03-10 NOTE — Plan of Care (Signed)
  Problem: Education: Goal: Knowledge of disease or condition will improve Outcome: Progressing Goal: Knowledge of the prescribed therapeutic regimen will improve Outcome: Progressing   Problem: Clinical Measurements: Goal: Complications related to the disease process, condition or treatment will be avoided or minimized Outcome: Progressing   Problem: Clinical Measurements: Goal: Ability to maintain clinical measurements within normal limits will improve Outcome: Progressing   Problem: Pain Managment: Goal: General experience of comfort will improve Outcome: Progressing   Problem: Safety: Goal: Ability to remain free from injury will improve Outcome: Progressing

## 2021-03-10 NOTE — Progress Notes (Signed)
HD 3 Previa  S: No complaints Brown spotting now decreased over 24h No BRB. Good FM. No LOF.  Rare contractions but more cramping when due for procardia dose O: BP 105/66 (BP Location: Left Arm)   Pulse (!) 105   Temp 97.9 F (36.6 C) (Oral)   Resp 18   Ht 5\' 2"  (1.575 m)   Wt 70.3 kg   LMP 07/05/2020 (Within Weeks)   SpO2 99%   BMI 28.35 kg/m   NCAT Neck: supple with FROM Lungs: cTA CV:RRR No CVAT ABD: gravid , NT VE: deferred EXT: SCDs absent CBC    Component Value Date/Time   WBC 9.4 03/07/2021 0433   RBC 3.43 (L) 03/07/2021 0433   HGB 10.8 (L) 03/07/2021 0433   HCT 32.3 (L) 03/07/2021 0433   PLT 247 03/07/2021 0433   PLT 260 03/07/2021 0433   MCV 94.2 03/07/2021 0433   MCV 96.7 04/08/2013 0943   MCH 31.5 03/07/2021 0433   MCHC 33.4 03/07/2021 0433   RDW 12.6 03/07/2021 0433   LYMPHSABS 2.9 10/03/2018 0528   MONOABS 0.7 10/03/2018 0528   EOSABS 0.7 (H) 10/03/2018 0528   BASOSABS 0.0 10/03/2018 0528   FHR 120s with accels. No decels. Rare contractions. Cat 1.  IMP: 35w 4d IUP Previa- stable. Had spotting at 16. First bleeding since that time. No evidence of accreta on sono or MRI Anemia- s/p Fe infusion Previous csection for previa with focal accreta  P: Continue inpt at this time Csection timing discussed. Continue tid monitoring  Continue procardia due to inc contractions. Pt aware that data not supportive of continuous tocolysis.

## 2021-03-11 LAB — GLUCOSE, CAPILLARY
Glucose-Capillary: 83 mg/dL (ref 70–99)
Glucose-Capillary: 107 mg/dL — ABNORMAL HIGH (ref 70–99)
Glucose-Capillary: 112 mg/dL — ABNORMAL HIGH (ref 70–99)

## 2021-03-11 MED ORDER — NIFEDIPINE 10 MG PO CAPS
20.0000 mg | ORAL_CAPSULE | ORAL | Status: DC
  Administered 2021-03-11 – 2021-03-12 (×7): 20 mg via ORAL
  Filled 2021-03-11 (×7): qty 2

## 2021-03-11 MED ORDER — LACTATED RINGERS IV SOLN: INTRAVENOUS | Status: DC

## 2021-03-11 MED ORDER — LACTATED RINGERS IV BOLUS
500.0000 mL | Freq: Once | INTRAVENOUS | Status: AC
  Administered 2021-03-11: 500 mL via INTRAVENOUS

## 2021-03-11 NOTE — Plan of Care (Signed)
  Problem: Education: Goal: Knowledge of disease or condition will improve Outcome: Progressing Goal: Knowledge of the prescribed therapeutic regimen will improve Outcome: Progressing   Problem: Clinical Measurements: Goal: Complications related to the disease process, condition or treatment will be avoided or minimized Outcome: Progressing   Problem: Clinical Measurements: Goal: Ability to maintain clinical measurements within normal limits will improve Outcome: Progressing   Problem: Activity: Goal: Risk for activity intolerance will decrease Outcome: Progressing   Problem: Coping: Goal: Level of anxiety will decrease Outcome: Progressing

## 2021-03-11 NOTE — Progress Notes (Signed)
Called by nursing that pt had "orange tinged" spotting in underwear, size of a coin; no active bleeding Came in to evaluate patient  Pt went to bathroom and noticed fluid in her underwear; didn't feel like a gush; went to bathroom and no vb with wiping Has noticed ctx over last few hrs tonight, more then during day today; just took procardia dose;  +FM  Patient Vitals for the past 24 hrs:  BP Temp Temp src Pulse Resp SpO2 Weight  03/10/21 2341 116/65 98.6 F (37 C) Oral 79 -- 99 % --  03/10/21 1934 114/77 98.5 F (36.9 C) Oral 91 18 99 % --  03/10/21 1708 118/70 -- -- 97 -- -- --  03/10/21 1204 108/67 98.6 F (37 C) Oral 98 18 100 % --  03/10/21 0756 105/66 97.9 F (36.6 C) Oral (!) 105 18 99 % --  03/10/21 0438 105/67 97.8 F (36.6 C) Oral 87 17 98 % 70.3 kg   A&ox3 nml respirations Abd: soft, nt, gravid CX: sse: no active bleeding from os; very small clot about 22mm noted in vault  FHT: 130s, nml variability, +accels, no decels TOCO: period of about 56min or so of q 1-3 min ctx and irritability, which has now decreased in frequency and no clear ctx  A/P: iup at 35.5 wga 1. Complete previa - minimal spotting currently; pt received procardia and appears to have good response as ctx seem to be resolving, irritability noted; minimal spotting, will monitor closely for further bleeding and precautions reviewed with pt; contin with procardia scheduled; pt states that she is comfortable with continued observation and was hoping that this is something that we can monitor 2.  Fetal status  - reassuring 3.  Prematurity - s/p bmz x2 4.  H/o c/s x1 with focal accreta, plan for repeat c/s - imaging this pregnancy shows normal appearing placenta and not accreta, though pt is consented for hysterectomy; pt has previously been informed of obliterated posterior culdesac and hysterectomy planned in future, in light of this, plan to also make general surgery when c/s is to be performed so that they are  on stand-by in case assistance needed

## 2021-03-11 NOTE — Progress Notes (Signed)
Dr. Murrell Redden called to request regular diet, and return to previous 1 hour Q shift FM.

## 2021-03-11 NOTE — Progress Notes (Signed)
No c/o; ?ctx this am but resolved; no bleeding since minimal spotting this am, nothing in pad or with wiping No lof; +FM  Patient Vitals for the past 24 hrs:  BP Temp Temp src Pulse Resp SpO2 Weight  03/11/21 0740 106/67 98.7 F (37.1 C) -- 89 18 97 % --  03/11/21 0500 -- -- -- -- -- -- 70.1 kg  03/11/21 0415 102/60 97.9 F (36.6 C) Oral 86 20 98 % --  03/10/21 2341 116/65 98.6 F (37 C) Oral 79 -- 99 % --  03/10/21 1934 114/77 98.5 F (36.9 C) Oral 91 18 99 % --  03/10/21 1708 118/70 -- -- 97 -- -- --  03/10/21 1204 108/67 98.6 F (37 C) Oral 98 18 100 % --    A&ox3 nml respirations Abd: soft, nt, nd, gravid LE: no edema, nt bilat  FHT: 130s, nml variablity; +accels, no decels TOCO: no ctx; prev irreg ctx to few ctx  A/P: iup 35.3 wga  1. Complete placenta previa: no vb, spotting this am; stable; continue procardia 20mg  q 4 hr; plan c/s Tues 12:15p (time per pt) with Dr Ronita Hipps; gen surgery to be on call if hysterectomy if accreta 2. Fetal status reassuring; q shift monitoring for 1 hr 3. Prematurity - s/p bmz x2 4. GDMA1 - fsbs wnl, contin diabetic diet 5. GBS pending 6. Anemia - s/p iv iron

## 2021-03-12 ENCOUNTER — Encounter (HOSPITAL_COMMUNITY): Payer: Self-pay | Admitting: Obstetrics

## 2021-03-12 ENCOUNTER — Encounter (HOSPITAL_COMMUNITY)
Admission: AD | Disposition: A | Discharge status: Home / Self Care | Payer: Self-pay | Source: Home / Self Care | Attending: Obstetrics

## 2021-03-12 ENCOUNTER — Inpatient Hospital Stay (HOSPITAL_COMMUNITY): Payer: BC Managed Care – PPO | Admitting: Certified Registered Nurse Anesthetist

## 2021-03-12 LAB — PREPARE RBC (CROSSMATCH)

## 2021-03-12 SURGERY — Surgical Case
Anesthesia: Spinal

## 2021-03-12 MED ORDER — BUPIVACAINE IN DEXTROSE 0.75-8.25 % IT SOLN
INTRATHECAL | Status: DC | PRN
  Administered 2021-03-12: 1.55 mL via INTRATHECAL

## 2021-03-12 MED ORDER — LACTATED RINGERS IV SOLN: INTRAVENOUS | Status: DC | PRN

## 2021-03-12 MED ORDER — METHYLERGONOVINE MALEATE 0.2 MG/ML IJ SOLN
INTRAMUSCULAR | Status: DC | PRN
  Administered 2021-03-12: .2 mg via INTRAMUSCULAR

## 2021-03-12 MED ORDER — PHENYLEPHRINE HCL-NACL 20-0.9 MG/250ML-% IV SOLN
INTRAVENOUS | Status: DC | PRN
  Administered 2021-03-12: 60 ug/min via INTRAVENOUS

## 2021-03-12 MED ORDER — KETOROLAC TROMETHAMINE 30 MG/ML IJ SOLN
INTRAMUSCULAR | Status: AC
  Filled 2021-03-12: qty 1

## 2021-03-12 MED ORDER — FENTANYL CITRATE (PF) 100 MCG/2ML IJ SOLN
INTRAMUSCULAR | Status: AC
  Filled 2021-03-12: qty 2

## 2021-03-12 MED ORDER — FENTANYL CITRATE (PF) 100 MCG/2ML IJ SOLN
INTRAMUSCULAR | Status: DC | PRN
  Administered 2021-03-12: 15 ug via INTRATHECAL

## 2021-03-12 MED ORDER — TETANUS-DIPHTH-ACELL PERTUSSIS 5-2.5-18.5 LF-MCG/0.5 IM SUSY
0.5000 mL | PREFILLED_SYRINGE | Freq: Once | INTRAMUSCULAR | Status: DC
  Filled 2021-03-12: qty 0.5

## 2021-03-12 MED ORDER — NALBUPHINE HCL 10 MG/ML IJ SOLN
5.0000 mg | Freq: Once | INTRAMUSCULAR | Status: DC | PRN
Start: 2021-03-12 — End: 2021-03-14

## 2021-03-12 MED ORDER — SCOPOLAMINE 1 MG/3DAYS TD PT72
MEDICATED_PATCH | TRANSDERMAL | Status: AC
  Filled 2021-03-12: qty 1

## 2021-03-12 MED ORDER — ONDANSETRON HCL 4 MG/2ML IJ SOLN: 4.0000 mg | Freq: Once | INTRAMUSCULAR | Status: DC | PRN

## 2021-03-12 MED ORDER — ZOLPIDEM TARTRATE 5 MG PO TABS: 5.0000 mg | ORAL_TABLET | Freq: Every evening | ORAL | Status: DC | PRN

## 2021-03-12 MED ORDER — OXYTOCIN-SODIUM CHLORIDE 30-0.9 UT/500ML-% IV SOLN
2.5000 [IU]/h | INTRAVENOUS | Status: AC
  Administered 2021-03-12: 2.5 [IU]/h via INTRAVENOUS
  Filled 2021-03-12: qty 500

## 2021-03-12 MED ORDER — OXYCODONE HCL 5 MG PO TABS: 5.0000 mg | ORAL_TABLET | Freq: Once | ORAL | Status: DC | PRN

## 2021-03-12 MED ORDER — SENNOSIDES-DOCUSATE SODIUM 8.6-50 MG PO TABS
2.0000 | ORAL_TABLET | ORAL | Status: DC
  Administered 2021-03-13 – 2021-03-14 (×2): 2 via ORAL
  Filled 2021-03-12 (×3): qty 2

## 2021-03-12 MED ORDER — KETOROLAC TROMETHAMINE 30 MG/ML IJ SOLN
30.0000 mg | Freq: Four times a day (QID) | INTRAMUSCULAR | Status: DC | PRN
  Administered 2021-03-12: 30 mg via INTRAVENOUS

## 2021-03-12 MED ORDER — OXYCODONE HCL 5 MG/5ML PO SOLN: 5.0000 mg | Freq: Once | ORAL | Status: DC | PRN

## 2021-03-12 MED ORDER — METHYLERGONOVINE MALEATE 0.2 MG/ML IJ SOLN
INTRAMUSCULAR | Status: AC
  Filled 2021-03-12: qty 1

## 2021-03-12 MED ORDER — NALBUPHINE HCL 10 MG/ML IJ SOLN: 5.0000 mg | INTRAMUSCULAR | Status: DC | PRN

## 2021-03-12 MED ORDER — METOCLOPRAMIDE HCL 5 MG/ML IJ SOLN
INTRAMUSCULAR | Status: AC
  Filled 2021-03-12: qty 2

## 2021-03-12 MED ORDER — IBUPROFEN 600 MG PO TABS
600.0000 mg | ORAL_TABLET | Freq: Four times a day (QID) | ORAL | Status: DC
  Administered 2021-03-13 – 2021-03-14 (×4): 600 mg via ORAL
  Filled 2021-03-12 (×4): qty 1

## 2021-03-12 MED ORDER — HYDROMORPHONE HCL 1 MG/ML IJ SOLN: 0.2500 mg | INTRAMUSCULAR | Status: DC | PRN

## 2021-03-12 MED ORDER — FENTANYL CITRATE (PF) 100 MCG/2ML IJ SOLN
INTRAMUSCULAR | Status: DC | PRN
  Administered 2021-03-12: 25 ug via INTRAVENOUS
  Administered 2021-03-12: 10 ug via INTRAVENOUS

## 2021-03-12 MED ORDER — OXYCODONE HCL 5 MG PO TABS: 5.0000 mg | ORAL_TABLET | ORAL | Status: DC | PRN

## 2021-03-12 MED ORDER — KETOROLAC TROMETHAMINE 30 MG/ML IJ SOLN
30.0000 mg | Freq: Four times a day (QID) | INTRAMUSCULAR | Status: AC
  Administered 2021-03-12 – 2021-03-13 (×3): 30 mg via INTRAVENOUS
  Filled 2021-03-12 (×3): qty 1

## 2021-03-12 MED ORDER — SIMETHICONE 80 MG PO CHEW
80.0000 mg | CHEWABLE_TABLET | ORAL | Status: DC | PRN
  Filled 2021-03-12: qty 1

## 2021-03-12 MED ORDER — ONDANSETRON HCL 4 MG/2ML IJ SOLN
INTRAMUSCULAR | Status: DC | PRN
  Administered 2021-03-12 (×2): 4 mg via INTRAVENOUS

## 2021-03-12 MED ORDER — DIPHENHYDRAMINE HCL 50 MG/ML IJ SOLN: 12.5000 mg | INTRAMUSCULAR | Status: DC | PRN

## 2021-03-12 MED ORDER — DIBUCAINE (PERIANAL) 1 % EX OINT
1.0000 "application " | TOPICAL_OINTMENT | CUTANEOUS | Status: DC | PRN
  Filled 2021-03-12: qty 28

## 2021-03-12 MED ORDER — DEXAMETHASONE SODIUM PHOSPHATE 10 MG/ML IJ SOLN
INTRAMUSCULAR | Status: DC | PRN
  Administered 2021-03-12: 10 mg via INTRAVENOUS

## 2021-03-12 MED ORDER — SIMETHICONE 80 MG PO CHEW
80.0000 mg | CHEWABLE_TABLET | Freq: Three times a day (TID) | ORAL | Status: DC
  Administered 2021-03-12 – 2021-03-14 (×7): 80 mg via ORAL
  Filled 2021-03-12 (×8): qty 1

## 2021-03-12 MED ORDER — CEFAZOLIN SODIUM-DEXTROSE 2-3 GM-%(50ML) IV SOLR
INTRAVENOUS | Status: DC | PRN
  Administered 2021-03-12: 2 g via INTRAVENOUS

## 2021-03-12 MED ORDER — DEXAMETHASONE SODIUM PHOSPHATE 10 MG/ML IJ SOLN
INTRAMUSCULAR | Status: AC
  Filled 2021-03-12: qty 1

## 2021-03-12 MED ORDER — COCONUT OIL OIL
1.0000 "application " | TOPICAL_OIL | Status: DC | PRN
  Administered 2021-03-12: 1 via TOPICAL
  Filled 2021-03-12: qty 120

## 2021-03-12 MED ORDER — SCOPOLAMINE 1 MG/3DAYS TD PT72
MEDICATED_PATCH | TRANSDERMAL | Status: DC | PRN
  Administered 2021-03-12: 1 via TRANSDERMAL

## 2021-03-12 MED ORDER — ONDANSETRON HCL 4 MG/2ML IJ SOLN
INTRAMUSCULAR | Status: AC
  Filled 2021-03-12: qty 4

## 2021-03-12 MED ORDER — SODIUM CHLORIDE 0.9 % IV SOLN: INTRAVENOUS | Status: DC | PRN

## 2021-03-12 MED ORDER — TRANEXAMIC ACID-NACL 1000-0.7 MG/100ML-% IV SOLN
INTRAVENOUS | Status: AC
  Filled 2021-03-12: qty 100

## 2021-03-12 MED ORDER — STERILE WATER FOR IRRIGATION IR SOLN
Status: DC | PRN
  Administered 2021-03-12: 1

## 2021-03-12 MED ORDER — SODIUM CHLORIDE 0.9 % IR SOLN
Status: DC | PRN
  Administered 2021-03-12 (×2): 1

## 2021-03-12 MED ORDER — SODIUM CHLORIDE 0.9% FLUSH: 3.0000 mL | INTRAVENOUS | Status: DC | PRN

## 2021-03-12 MED ORDER — MENTHOL 3 MG MT LOZG
1.0000 | LOZENGE | OROMUCOSAL | Status: DC | PRN
  Filled 2021-03-12: qty 9

## 2021-03-12 MED ORDER — METOCLOPRAMIDE HCL 5 MG/ML IJ SOLN
INTRAMUSCULAR | Status: DC | PRN
  Administered 2021-03-12: 10 mg via INTRAVENOUS

## 2021-03-12 MED ORDER — NALOXONE HCL 4 MG/10ML IJ SOLN
1.0000 ug/kg/h | INTRAVENOUS | Status: DC | PRN
  Filled 2021-03-12: qty 5

## 2021-03-12 MED ORDER — ACETAMINOPHEN 500 MG PO TABS
1000.0000 mg | ORAL_TABLET | Freq: Four times a day (QID) | ORAL | Status: DC
  Administered 2021-03-12 – 2021-03-14 (×7): 1000 mg via ORAL
  Filled 2021-03-12 (×8): qty 2

## 2021-03-12 MED ORDER — KETOROLAC TROMETHAMINE 30 MG/ML IJ SOLN: 30.0000 mg | Freq: Four times a day (QID) | INTRAMUSCULAR | Status: DC | PRN

## 2021-03-12 MED ORDER — MEPERIDINE HCL 25 MG/ML IJ SOLN
6.2500 mg | INTRAMUSCULAR | Status: DC | PRN
Start: 2021-03-12 — End: 2021-03-12

## 2021-03-12 MED ORDER — DIPHENHYDRAMINE HCL 25 MG PO CAPS: 25.0000 mg | ORAL_CAPSULE | ORAL | Status: DC | PRN

## 2021-03-12 MED ORDER — ONDANSETRON HCL 4 MG/2ML IJ SOLN: 4.0000 mg | Freq: Three times a day (TID) | INTRAMUSCULAR | Status: DC | PRN

## 2021-03-12 MED ORDER — LACTATED RINGERS IV SOLN: INTRAVENOUS | Status: DC

## 2021-03-12 MED ORDER — OXYTOCIN-SODIUM CHLORIDE 30-0.9 UT/500ML-% IV SOLN
INTRAVENOUS | Status: DC | PRN
  Administered 2021-03-12: 30 [IU] via INTRAVENOUS

## 2021-03-12 MED ORDER — PRENATAL MULTIVITAMIN CH
1.0000 | ORAL_TABLET | Freq: Every day | ORAL | Status: DC
  Administered 2021-03-13 – 2021-03-14 (×2): 1 via ORAL
  Filled 2021-03-12 (×3): qty 1

## 2021-03-12 MED ORDER — CEFAZOLIN SODIUM-DEXTROSE 2-4 GM/100ML-% IV SOLN
INTRAVENOUS | Status: AC
  Filled 2021-03-12: qty 100

## 2021-03-12 MED ORDER — WITCH HAZEL-GLYCERIN EX PADS: 1.0000 "application " | MEDICATED_PAD | CUTANEOUS | Status: DC | PRN

## 2021-03-12 MED ORDER — SCOPOLAMINE 1 MG/3DAYS TD PT72: 1.0000 | MEDICATED_PATCH | Freq: Once | TRANSDERMAL | Status: DC

## 2021-03-12 MED ORDER — TRANEXAMIC ACID-NACL 1000-0.7 MG/100ML-% IV SOLN
INTRAVENOUS | Status: DC | PRN
  Administered 2021-03-12: 1000 mg via INTRAVENOUS

## 2021-03-12 MED ORDER — MORPHINE SULFATE (PF) 0.5 MG/ML IJ SOLN
INTRAMUSCULAR | Status: DC | PRN
  Administered 2021-03-12: 150 ug via INTRATHECAL

## 2021-03-12 MED ORDER — MORPHINE SULFATE (PF) 0.5 MG/ML IJ SOLN
INTRAMUSCULAR | Status: AC
  Filled 2021-03-12: qty 10

## 2021-03-12 MED ORDER — NALBUPHINE HCL 10 MG/ML IJ SOLN: 5.0000 mg | Freq: Once | INTRAMUSCULAR | Status: DC | PRN

## 2021-03-12 MED ORDER — NALOXONE HCL 0.4 MG/ML IJ SOLN: 0.4000 mg | INTRAMUSCULAR | Status: DC | PRN

## 2021-03-12 SURGICAL SUPPLY — 34 items
BENZOIN TINCTURE PRP APPL 2/3 (GAUZE/BANDAGES/DRESSINGS) ×2 IMPLANT
CHLORAPREP W/TINT 26ML (MISCELLANEOUS) ×2 IMPLANT
CLAMP CORD UMBIL (MISCELLANEOUS) IMPLANT
CLOSURE STERI STRIP 1/2 X4 (GAUZE/BANDAGES/DRESSINGS) ×2 IMPLANT
CLOTH BEACON ORANGE TIMEOUT ST (SAFETY) ×2 IMPLANT
DRSG OPSITE POSTOP 4X10 (GAUZE/BANDAGES/DRESSINGS) ×2 IMPLANT
ELECT REM PT RETURN 9FT ADLT (ELECTROSURGICAL) ×2
ELECTRODE REM PT RTRN 9FT ADLT (ELECTROSURGICAL) ×1 IMPLANT
EXTRACTOR VACUUM M CUP 4 TUBE (SUCTIONS) IMPLANT
GLOVE BIO SURGEON STRL SZ7.5 (GLOVE) ×2 IMPLANT
GLOVE BIOGEL PI IND STRL 7.0 (GLOVE) ×1 IMPLANT
GLOVE BIOGEL PI INDICATOR 7.0 (GLOVE) ×1
GOWN STRL REUS W/TWL LRG LVL3 (GOWN DISPOSABLE) ×4 IMPLANT
KIT ABG SYR 3ML LUER SLIP (SYRINGE) IMPLANT
NEEDLE HYPO 22GX1.5 SAFETY (NEEDLE) ×2 IMPLANT
NEEDLE HYPO 25X5/8 SAFETYGLIDE (NEEDLE) IMPLANT
NEEDLE SPNL 20GX3.5 QUINCKE YW (NEEDLE) IMPLANT
NS IRRIG 1000ML POUR BTL (IV SOLUTION) ×2 IMPLANT
PACK C SECTION WH (CUSTOM PROCEDURE TRAY) ×2 IMPLANT
PENCIL SMOKE EVAC W/HOLSTER (ELECTROSURGICAL) ×2 IMPLANT
SUT MNCRL 0 VIOLET CTX 36 (SUTURE) ×2 IMPLANT
SUT MNCRL AB 3-0 PS2 27 (SUTURE) IMPLANT
SUT MON AB 2-0 CT1 27 (SUTURE) ×2 IMPLANT
SUT MON AB-0 CT1 36 (SUTURE) ×4 IMPLANT
SUT MONOCRYL 0 CTX 36 (SUTURE) ×2
SUT PLAIN 0 NONE (SUTURE) IMPLANT
SUT PLAIN 2 0 (SUTURE)
SUT PLAIN 2 0 XLH (SUTURE) IMPLANT
SUT PLAIN ABS 2-0 CT1 27XMFL (SUTURE) IMPLANT
SYR 20CC LL (SYRINGE) IMPLANT
SYR CONTROL 10ML LL (SYRINGE) ×2 IMPLANT
TOWEL OR 17X24 6PK STRL BLUE (TOWEL DISPOSABLE) ×2 IMPLANT
TRAY FOLEY W/BAG SLVR 14FR LF (SET/KITS/TRAYS/PACK) ×2 IMPLANT
WATER STERILE IRR 1000ML POUR (IV SOLUTION) ×2 IMPLANT

## 2021-03-12 NOTE — Op Note (Signed)
03/12/2021  8:02 AM  PATIENT:  Jacqueline [name]  30 y.o. female  PRE-OPERATIVE DIAGNOSIS:  Previous Cesarean Section, Gestational Diabetes, Placenta Previa, active bleeding,  POST-OPERATIVE DIAGNOSIS: Previous cesarean section, complete placenta previa, gestational diabetes, footling breech, active bleeding   PROCEDURE: Repeat cesarean section, low transverse cesarean section with 2 layer closure   SURGEON:  Surgeon(s) and Role: Aloha Gell, MD - Primary Derrell Lolling, CNM  PHYSICIAN ASSISTANT:   ASSISTANTS: As above  ANESTHESIA:   spinal  EBL: 530  BLOOD ADMINISTERED:none  DRAINS: Urinary Catheter (Foley)   LOCAL MEDICATIONS USED:  NONE  SPECIMEN:  Source of Specimen:  Placenta  DISPOSITION OF SPECIMEN:  PATHOLOGY  COUNTS:  YES  TOURNIQUET:  * No tourniquets in log *  DICTATION: .Note written in EPIC  PLAN OF CARE: Admit to inpatient   PATIENT DISPOSITION:  PACU - hemodynamically stable.   Delay start of Pharmacological VTE agent (>24hrs) due to surgical blood loss or risk of bleeding: yes     Findings:  @BABYSEXEBC @ infant,  APGAR (1 MIN): 8  , double footling breech APGAR (5 MINS): 9   APGAR (10 MINS):   Normal uterus with obliterated posterior cul-de-sac and limited mobility, nonvisualization of bilateral fallopian tubes.  Right ovary seen adhesed to the low lateral uterus, left ovary never seen but suspected to be adhesed to the posterior uterus involved in posterior obliterated cul-de-sac.  This was by palpation only.  normal placenta which separated easily.  Complete previa.. 3VC, clear amniotic fluid  EBL: 530 cc Antibiotics:   2g Ancef Complications: none  Indications: This is a 30 y.o. year-old,  at [redacted]w[redacted]d admitted for placenta previa.  Patient was admitted last week with her first bleed.  She received betamethasone and continued with reactive fetal testing.  Bleeding was minimal.  Patient is anemic and IV iron x1 was given.  Plan was for proceeding  with repeat cesarean section with her next bleeding.  Patient had been on tocolytics.  Patient had some light bleeding yesterday and increased irritability.  This a.m. she had a large gush that soaked a pad with a continued trickle.  Decision was made to proceed with repeat cesarean section.  2 IVs were going.  Type and screen was active.  Patient was consented for blood.. Risks benefits and alternatives of the procedure were discussed with the patient who agreed to proceed.  Patient was aware of the increased risk given the placenta previa and the possible accreta spectrum especially with her history of known stage IV endometriosis.  Procedure:  After informed consent was obtained the patient was taken to the operating room where spinal anesthesia was initiated.  She was prepped and draped in the normal sterile fashion in dorsal supine position with a leftward tilt.  A foley catheter was in place.  A Pfannenstiel skin incision was made 2 cm above the pubic symphysis in the midline with the scalpel.  Dissection was carried down with the Bovie cautery until the fascia was reached. The fascia was incised in the midline. The incision was extended laterally with the Mayo scissors. The inferior aspect of the fascial incision was grasped with the Coker clamps, elevated up and the underlying rectus muscles were dissected off sharply. The superior aspect of the fascial incision was grasped with the Coker clamps elevated up and the underlying rectus muscles were dissected off sharply.  The peritoneum was entered sharply. The peritoneal incision was extended superiorly and inferiorly with good visualization of the bladder. The bladder  blade was inserted and palpation was done to assess the fetal position and the location of the uterine vessels. The lower segment of the uterus was incised sharply with the scalpel and extended  bluntly in the cephalo-caudal fashion.  Footling breech was confirmed.  Both feet were grasped and  pulled out through the incision.  Baby delivered up to the shoulders without complication.  Left then right shoulder delivered easily by sweeping across the chest and the head was delivered in flexion.  Spontaneous cry was noted.  The cord was clamped and cut immediately due to concern for maternal bleeding.  The infant was handed off to the waiting pediatrician. The placenta was expressed and appeared to be complete.  No trailing membranes were noted.  Placenta was examined and felt to be complete.  Increased bleeding were noted from the placental beds in the lower segment.  Methergine and trans-Amick acid were given.. The uterus was unable to be exteriorized due to the posterior adhesions.. The uterus was cleared of all clots and debris. The uterine incision was repaired with 0 Vicryl in a running locked fashion.  A second layer of the same suture was used in an imbricating fashion to obtain excellent hemostasis. the gutters were cleared of all clots and debris. The uterine incision was reinspected and found to be hemostatic.  Again attempt to see the ovaries was done.  Right ovary seen adhesed to the lower posterior uterus.  Left ovary not seen but suspected to be palpated in the posterior cul-de-sac adhesions.  The peritoneum was grasped and closed with 2-0 Vicryl in a running fashion. The cut muscle edges and the underside of the fascia were inspected and found to be hemostatic. The fascia was closed with 0 Vicryl in a single layer. The subcutaneous tissue was irrigated. Scarpa's layer was closed with a 2-0 plain gut suture. The skin was closed with a 4-0 Monocryl in a single layer. The patient tolerated the procedure well. Sponge lap and needle counts were correct x3 and patient was taken to the recovery room in a stable condition.  Ala Dach 03/12/2021 8:08 AM

## 2021-03-12 NOTE — Anesthesia Postprocedure Evaluation (Signed)
Anesthesia Post Note  Patient: Jacqueline [name]  Procedure(s) Performed: Repeat CESAREAN SECTION/Possible Total Abdominal Hysterectomy (N/A )     Patient location during evaluation: PACU Anesthesia Type: Spinal Level of consciousness: oriented and awake and alert Pain management: pain level controlled Vital Signs Assessment: post-procedure vital signs reviewed and stable Respiratory status: spontaneous breathing, respiratory function stable and patient connected to nasal cannula oxygen Cardiovascular status: blood pressure returned to baseline and stable Postop Assessment: no headache, no backache and no apparent nausea or vomiting Anesthetic complications: no   No complications documented.  Last Vitals:  Vitals:   03/12/21 1655 03/12/21 1846  BP:  96/65  Pulse:  61  Resp: 17 18  Temp:  37.3 C  SpO2: 97% 98%    Last Pain:  Vitals:   03/12/21 1846  TempSrc: Oral  PainSc: 0-No pain                 Tally Mattox

## 2021-03-12 NOTE — Brief Op Note (Signed)
03/12/2021  8:02 AM  PATIENT:  Jacqueline [name]  30 y.o. female  PRE-OPERATIVE DIAGNOSIS:  Previous Cesarean Section, Gestational Diabetes, Placenta Previa, active bleeding,  POST-OPERATIVE DIAGNOSIS: Previous cesarean section, complete placenta previa, gestational diabetes, footling breech, active bleeding   PROCEDURE: Repeat cesarean section, low transverse cesarean section with 2 layer closure   SURGEON:  Surgeon(s) and Role: Aloha Gell, MD - Primary Derrell Lolling, CNM  PHYSICIAN ASSISTANT:   ASSISTANTS: As above  ANESTHESIA:   spinal  EBL: 530  BLOOD ADMINISTERED:none  DRAINS: Urinary Catheter (Foley)   LOCAL MEDICATIONS USED:  NONE  SPECIMEN:  Source of Specimen:  Placenta  DISPOSITION OF SPECIMEN:  PATHOLOGY  COUNTS:  YES  TOURNIQUET:  * No tourniquets in log *  DICTATION: .Note written in EPIC  PLAN OF CARE: Admit to inpatient   PATIENT DISPOSITION:  PACU - hemodynamically stable.   Delay start of Pharmacological VTE agent (>24hrs) due to surgical blood loss or risk of bleeding: yes

## 2021-03-12 NOTE — Lactation Note (Signed)
This note was copied from a baby's chart. Lactation Consultation Note  Patient Name: Jacqueline [name] [id number] Date: 03/12/2021 Reason for consult: Initial assessment;Late-preterm 34-36.6wks Age:31 hours  Visited with mom of 37 hours old LPI female, she's a P2 and has been hand expression since she was pregnant (her breasts got really full) she's been using her colostrum that she's got the last 6 days when she was admitted to the hospital.   Set her up with a DEBP, even though she brought her own, she prefers to pump with a hospital grade pump. LC also requested coconut oil for her and assisted her with putting on her pumping bra, mom was appreciative.   Baby was finishing up a feeding by the time LC came in the room, and when I got closer to baby to assess LATCH score noticed he was asleep already. Asked mom to call for assistance when needed. Reviewed normal newborn behavior, feeding cues, cluster feeding, pumping schedule and LPI policy. Mom chooses to supplementing with donor milk in addition to her own.  Feeding plan:  1. Encouraged mom to feed baby STS 8-12 times/24 hours or sooner if feeding cues are present 2. Pumping every 3 hours was also encouraged 3. Parents will continue supplementing with EBM/donor milk every 3 hours according to LPI policy  BF brochure, BF resources and feeding diary were reviewed. FOB present and supportive. Parents reproted all questions and concerns were answered, they're both aware of [place] OP services and will call PRN.    Maternal Data Has patient been taught Hand Expression?: Yes Does the patient have breastfeeding experience prior to this delivery?: Yes How long did the patient breastfeed?: 6 months  Feeding Mother's Current Feeding Choice: Breast Milk and Donor Milk  LATCH Score Latch: Repeated attempts needed to sustain latch, nipple held in mouth throughout feeding, stimulation needed to elicit sucking reflex.  Audible Swallowing: A few with  stimulation  Type of Nipple: Everted at rest and after stimulation  Comfort (Breast/Nipple): Soft / non-tender  Hold (Positioning): Assistance needed to correctly position infant at breast and maintain latch.  LATCH Score: 7   Lactation Tools Discussed/Used Tools: Pump;Flanges;Coconut oil Flange Size: 24 Breast pump type: Double-Electric Breast Pump Pump Education: Setup, frequency, and cleaning;Milk Storage Reason for Pumping: LPI Pumping frequency: Q 3 hours  Interventions Interventions: Breast feeding basics reviewed;DEBP;Coconut oil  Discharge Pump: DEBP;Personal (Medela DEBP at home) [facility] Program: No  Consult Status Consult Status: Follow-up Date: 03/13/21 Follow-up type: [place] 03/12/2021, 5:34 PM

## 2021-03-12 NOTE — Anesthesia Preprocedure Evaluation (Signed)
Anesthesia Evaluation  Patient identified by MRN, date of birth, ID band Patient awake    Reviewed: Allergy & Precautions, H&P , NPO status , Patient's Chart, lab work & pertinent test results  History of Anesthesia Complications Negative for: history of anesthetic complications  Airway Mallampati: I  TM Distance: >3 FB Neck ROM: full    Dental no notable dental hx.    Pulmonary neg pulmonary ROS,    Pulmonary exam normal        Cardiovascular negative cardio ROS Normal cardiovascular exam     Neuro/Psych negative neurological ROS  negative psych ROS   GI/Hepatic negative GI ROS, Neg liver ROS,   Endo/Other  diabetes, Gestational  Renal/GU negative Renal ROS  negative genitourinary   Musculoskeletal   Abdominal   Peds  Hematology negative hematology ROS (+)   Anesthesia Other Findings  Placenta previa with high risk for accreta  Reproductive/Obstetrics (+) Pregnancy                             Anesthesia Physical Anesthesia Plan  ASA: III and emergent  Anesthesia Plan: Spinal   Post-op Pain Management:    Induction:   PONV Risk Score and Plan: Ondansetron and Treatment may vary due to age or medical condition  Airway Management Planned:   Additional Equipment:   Intra-op Plan:   Post-operative Plan:   Informed Consent: I have reviewed the patients History and Physical, chart, labs and discussed the procedure including the risks, benefits and alternatives for the proposed anesthesia with the patient or authorized representative who has indicated his/her understanding and acceptance.       Plan Discussed with:   Anesthesia Plan Comments:         Anesthesia Quick Evaluation

## 2021-03-12 NOTE — Anesthesia Procedure Notes (Signed)
Spinal  Patient location during procedure: OR Start time: 03/12/2021 6:50 AM End time: 03/12/2021 6:53 AM Reason for block: surgical anesthesia Staffing Anesthesiologist: Janeece Riggers, MD Preanesthetic Checklist Completed: patient identified, IV checked, site marked, risks and benefits discussed, surgical consent, monitors and equipment checked, pre-op evaluation and timeout performed Spinal Block Patient position: sitting Prep: DuraPrep Patient monitoring: heart rate, cardiac monitor, continuous pulse ox and blood pressure Approach: midline Location: L3-4 Injection technique: single-shot Needle Needle type: Sprotte  Needle gauge: 24 G Needle length: 9 cm Assessment Sensory level: T4 Events: CSF return

## 2021-03-12 NOTE — Progress Notes (Signed)
CTSP bleeding  Pt notes heavy bleed x 1, now just trickly, active FM, feeling contractions  O: Reactive NST Small staining on pad Alert/ nervous/ ready for c/s  CBC    Component Value Date/Time   WBC 9.4 03/07/2021 0433   RBC 3.43 (L) 03/07/2021 0433   HGB 10.8 (L) 03/07/2021 0433   HCT 32.3 (L) 03/07/2021 0433   PLT 247 03/07/2021 0433   PLT 260 03/07/2021 0433   MCV 94.2 03/07/2021 0433   MCV 96.7 04/08/2013 0943   MCH 31.5 03/07/2021 0433   MCHC 33.4 03/07/2021 0433   RDW 12.6 03/07/2021 0433   LYMPHSABS 2.9 10/03/2018 0528   MONOABS 0.7 10/03/2018 0528   EOSABS 0.7 (H) 10/03/2018 0528   BASOSABS 0.0 10/03/2018 0528    A/PL RCS, previa, concern for possible accreta due to history but no evidence by u/s and MRI. Consents to blood. Declined TL to me on admit.  Ala Dach 03/12/2021 6:41 AM

## 2021-03-12 NOTE — Transfer of Care (Signed)
Immediate Anesthesia Transfer of Care Note  Patient: Jacqueline [name]  Procedure(s) Performed: Repeat CESAREAN SECTION/Possible Total Abdominal Hysterectomy (N/A )  Patient Location: PACU  Anesthesia Type:Spinal  Level of Consciousness: awake, alert  and oriented  Airway & Oxygen Therapy: Patient Spontanous Breathing and Patient connected to nasal cannula oxygen  Post-op Assessment: Report given to RN and Post -op Vital signs reviewed and stable  Post vital signs: Reviewed and stable  Last Vitals:  Vitals Value Taken Time  BP 118/63 03/12/21 0815  Temp    Pulse 77 03/12/21 0817  Resp 16 03/12/21 0817  SpO2 100 % 03/12/21 0817  Vitals shown include unvalidated device data.  Last Pain:  Vitals:   03/12/21 0608  TempSrc:   PainSc: (P) 4       Patients Stated Pain Goal: 0 (59/93/57 0177)  Complications: No complications documented.

## 2021-03-13 DIAGNOSIS — D62 Acute posthemorrhagic anemia: Secondary | ICD-10-CM

## 2021-03-13 LAB — CBC
HCT: 30.3 % — ABNORMAL LOW (ref 36.0–46.0)
Hemoglobin: 9.9 g/dL — ABNORMAL LOW (ref 12.0–15.0)
MCH: 31.7 pg (ref 26.0–34.0)
MCHC: 32.7 g/dL (ref 30.0–36.0)
MCV: 97.1 fL (ref 80.0–100.0)
Platelets: 248 10*3/uL (ref 150–400)
RBC: 3.12 MIL/uL — ABNORMAL LOW (ref 3.87–5.11)
RDW: 13.1 % (ref 11.5–15.5)
WBC: 17.5 10*3/uL — ABNORMAL HIGH (ref 4.0–10.5)
nRBC: 0 % (ref 0.0–0.2)

## 2021-03-13 NOTE — Progress Notes (Signed)
POD#1  S: Pt notes pain controlled w/ po meds, minimal lochia, nl void, out of bed w/o dizziness or chest pain, tol reg po, + flatus. Pt is  Breastfeeding.  Patient happy with C-section outcome.  Happy baby is in the room.  Desires circumcision  Vitals:   03/13/21 0038 03/13/21 0140 03/13/21 0314 03/13/21 0611  BP: 98/63  123/71   Pulse:      Resp: 18 18 18    Temp: 97.7 F (36.5 C)  98.1 F (36.7 C)   TempSrc: Oral  Oral   SpO2: 97% 97% 100% 100%  Weight:      Height:        Gen: well appearing Abd: soft distention with tympany, approp tender, fundus below umbilicus, NT Inc: C/D/I,  LE: tr edema, NT  CBC    Component Value Date/Time   WBC 17.5 (H) 03/13/2021 0458   RBC 3.12 (L) 03/13/2021 0458   HGB 9.9 (L) 03/13/2021 0458   HCT 30.3 (L) 03/13/2021 0458   PLT 248 03/13/2021 0458   MCV 97.1 03/13/2021 0458   MCV 96.7 04/08/2013 0943   MCH 31.7 03/13/2021 0458   MCHC 32.7 03/13/2021 0458   RDW 13.1 03/13/2021 0458   LYMPHSABS 2.9 10/03/2018 0528   MONOABS 0.7 10/03/2018 0528   EOSABS 0.7 (H) 10/03/2018 0528   BASOSABS 0.0 10/03/2018 0528    A/P: POD#1 s/p repeat C-section in the setting of placenta previa with active bleeding. - post-op. Doing well.  -Anemia.  Appropriate blood loss Intra-Op.  We will keep plan for second IV iron dose tomorrow -Desire circumcision  Ala Dach 03/13/2021 10:52 AM

## 2021-03-13 NOTE — Lactation Note (Signed)
This note was copied from a baby's chart. Lactation Consultation Note  Patient Name: Boy Leeasia Secrist [id number] Date: 03/13/2021 Reason for consult: Follow-up assessment;Late-preterm 34-36.6wks Age:31 hours  Follow up visit to 8 hours old LPT infant with 5.49% weight loss at the time of visit. Mother states breastfeeding is going well. Infant is feeding for ~25-minutes. Mother reports mild breast changes. Per mother, pumping is improving each session and collecting ~6 mL per pumping session.  Mother states infant taking ~10-15 mL of EBM+DM per feeding currently. LC reviewed pace bottle-feeding technique, upright or side-lying position and frequent burping. Infant has been been having good voids and stools.    Feeding plan:  1-Skin to skin 2-Aim for a deep, comfortable latch 3-Breastfeeding on demand or 8-12 times in 24h period. 4-Keep infant awake during breastfeeding session: massaging breast, infant's hand/shoulder/feet 5-Pump and supplement following guidelines, paced bottle feeding and fullness cues.  6-Preserve infant energy limiting feeding sessions to 30 min max.  7-Monitor voids and stools as signs good intake.  8-Encouraged maternal rest, hydration and food intake.  9-Contact LC as needed for feeds/support/concerns/questions   All questions answered at this time.    Feeding Mother's Current Feeding Choice: Breast Milk and Donor Milk  Lactation Tools Discussed/Used Pumped volume: 6 mL (per mother)  Interventions Interventions: Breast feeding basics reviewed;Expressed milk;Education;DEBP;Coconut oil;Breast massage;Hand express  Consult Status Consult Status: Follow-up Date: 03/14/21 Follow-up type: In-patient    Bora Broner A Higuera Ancidey 03/13/2021, 5:20 PM

## 2021-03-14 LAB — RPR: RPR Ser Ql: NONREACTIVE

## 2021-03-14 MED ORDER — FERROUS SULFATE 325 (65 FE) MG PO TABS: 325.0000 mg | ORAL_TABLET | Freq: Every day | ORAL | 3 refills | Status: DC

## 2021-03-14 MED ORDER — ACETAMINOPHEN 500 MG PO TABS: 1000.0000 mg | ORAL_TABLET | Freq: Four times a day (QID) | ORAL | 0 refills | Status: DC

## 2021-03-14 MED ORDER — COCONUT OIL OIL: 1.0000 "application " | TOPICAL_OIL | 0 refills | Status: DC | PRN

## 2021-03-14 MED ORDER — IBUPROFEN 600 MG PO TABS: 600.0000 mg | ORAL_TABLET | Freq: Four times a day (QID) | ORAL | 0 refills | Status: DC

## 2021-03-14 NOTE — Lactation Note (Signed)
This note was copied from a baby's chart. Lactation Consultation Note  Patient Name: Jacqueline [name] Date: 03/14/2021 Reason for consult: Follow-up assessment Age:31 hours   P2 mother whose infant is now 49 hours old.  This is a LPTI at 35+6 with a CGA of 36+1 weeks with a 10% weight loss this morning.  Mother is breast feeding and bottle feeding using her EBM, however, she informed me that she most likely will just pump and bottle feed after discharge.  Family has been discharged per pediatrician.  Mother had no questions/concerns related to breast feeding.  Stressed the importance of continuing to breast feed first if desired followed by supplementation.  Mother is pumping large volumes of milk and has adequate amounts for baby.  Encouraged 30+ mls every three hours now.  Mother verbalized understanding.  Last LATCH score was a 10; voiding/stooling well and stools are transitioning.  Offered an OP [place] appointment, however, mother politely declined.  She has our OP phone number for any further concerns.  Father present.     Maternal Data    Feeding Nipple Type: Slow - flow  LATCH Score                    Lactation Tools Discussed/Used    Interventions    Discharge Discharge Education: Engorgement and breast care  Consult Status Consult Status: Complete Date: 03/14/21 Follow-up type: Call as needed    [name] 03/14/2021, 12:44 PM

## 2021-03-14 NOTE — Discharge Summary (Signed)
Postpartum Discharge Summary  Date of Service updated 03/14/21     Patient Name: Jacqueline [name] DOB: 1991/06/25 MRN: [id number]  Date of admission: 03/07/2021 Delivery date:03/12/2021  Delivering provider: Aloha Gell  Date of discharge: 03/14/2021  Admitting diagnosis: Placenta previa antepartum in third trimester [O44.03] Intrauterine pregnancy: [redacted]w[redacted]d    Secondary diagnosis:  Principal Problem:   Postpartum care following cesarean delivery (5/7) Active Problems:   Placenta previa antepartum in third trimester   Acute blood loss as cause of postoperative anemia  Additional problems: Gestational diabetes    Discharge diagnosis: Term Pregnancy Delivered, GDM A1 and Anemia, cesarean section                                               Post partum procedures:n/a Augmentation: N/A Complications: Placenta previa with vaginal bleeding  Hospital course: Sceduled C/S   30y.o. yo G3P0212 at [id number] admitted to the hospital 03/07/2021 for vaginal bleeding r/t placenta previa. She has remained inpatient and developed further bleeding on 5/7 and decision was made for a repeat cesarean section with the following indication:Previa.Delivery details are as follows:  Membrane Rupture Time/Date: 7:13 AM ,03/12/2021   Delivery Method:C-Section, Low Transverse  Details of operation can be found in separate operative note.  Patient is doing well and desires discharge home on POD#2. Peds cleared baby for discharge. Patient had an uncomplicated postpartum course.  She is ambulating, tolerating a regular diet, passing flatus, and urinating well. Patient is discharged home in stable condition on  03/14/21. We discussed IV Venofer infusion. She received 1 dose on 5/2 and was scheduled for a repeat dose in 2 weeks. However, hemoglobin stable, and patient desires to continue oral FE only.         Newborn Data: Birth date:03/12/2021  Birth time:7:13 AM  Gender:Female  Living status:Living  Apgars:8 ,9   Weight:3280 g     Magnesium Sulfate received: No BMZ received: Yes Rhophylac:N/A MMR:N/A T-DaP:not on file Flu: N/A Transfusion:No  Physical exam  Vitals:   03/13/21 0611 03/13/21 1400 03/13/21 2034 03/14/21 0650  BP:  101/70 112/66 114/81  Pulse:  60 (!) 58 67  Resp:  _0 Temp:  98.3 F (36.8 C) 98.4 F (36.9 C) 97.6 F (36.4 C)  TempSrc:  Oral Oral Oral  SpO2: 100%  100% 100%  Weight:      Height:       General: alert, cooperative and no distress  Heart: RRR Lungs: clear, equal Lochia: appropriate Uterine Fundus: firm, below umbilicus Incision: Healing well with no significant drainage, Dressing is clean, dry, and intact DVT Evaluation: No evidence of DVT seen on physical exam. Negative Homan's sign. No cords or calf tenderness. Labs: Lab Results  Component Value Date   WBC 17.5 (H) 03/13/2021   HGB 9.9 (L) 03/13/2021   HCT 30.3 (L) 03/13/2021   MCV 97.1 03/13/2021   PLT 248 03/13/2021   CMP Latest Ref Rng & Units 09/22/2018  Glucose 70 - 99 mg/dL 125(H)  BUN 6 - 20 mg/dL <5(L)  Creatinine 0.44 - 1.00 mg/dL 0.64  Sodium 135 - 145 mmol/L 137  Potassium 3.5 - 5.1 mmol/L 3.5  Chloride 98 - 111 mmol/L 103  CO2 22 - 32 mmol/L 25  Calcium 8.9 - 10.3 mg/dL 7.3(L)  Total Protein 6.5 - 8.1 g/dL 5.6(L)  Total Bilirubin 0.3 - 1.2 mg/dL 0.7  Alkaline Phos 38 - 126 U/L 62  AST 15 - 41 U/L 20  ALT 0 - 44 U/L 19   Edinburgh Score: Edinburgh Postnatal Depression Scale Screening Tool 03/13/2021  I have been able to laugh and see the funny side of things. 0  I have looked forward with enjoyment to things. 0  I have blamed myself unnecessarily when things went wrong. 0  I have been anxious or worried for no good reason. 0  I have felt scared or panicky for no good reason. 0  Things have been getting on top of me. 0  I have been so unhappy that I have had difficulty sleeping. 0  I have felt sad or miserable. 0  I have been so unhappy that I have been crying. 0   The thought of harming myself has occurred to me. 0  Edinburgh Postnatal Depression Scale Total 0      After visit meds:  Allergies as of 03/14/2021      Reactions   Shellfish Allergy Anaphylaxis   Augmentin [amoxicillin-pot Clavulanate]    childhood   Ciprofloxacin    Caused QT prolongation   Wound Dressing Adhesive Other (See Comments)   Had a cellulitis reaction.  Unsure if it was drape or dressing related   Clavulanic Acid Rash   Had rash with Augmentin as a child but tolerates amoxicillin;       Medication List    TAKE these medications   acetaminophen 500 MG tablet Commonly known as: TYLENOL Take 2 tablets (1,000 mg total) by mouth every 6 (six) hours. What changed:   medication strength  how much to take  when to take this  reasons to take this   calcium carbonate 500 MG chewable tablet Commonly known as: TUMS - dosed in mg elemental calcium Chew 1-2 tablets by mouth 3 (three) times daily as needed for indigestion or heartburn.   coconut oil Oil Apply 1 application topically as needed.   ferrous sulfate 325 (65 FE) MG tablet Take 1 tablet (325 mg total) by mouth daily.   ibuprofen 600 MG tablet Commonly known as: ADVIL Take 1 tablet (600 mg total) by mouth every 6 (six) hours.   loratadine 10 MG tablet Commonly known as: CLARITIN Take 10 mg by mouth daily as needed for allergies.   PRENATAL VITAMIN PO Take 1 tablet by mouth in the morning.        Discharge home in stable condition Infant Feeding: Bottle and Breast Infant Disposition:home with mother Discharge instruction: per After Visit Summary and Postpartum booklet. Activity: Advance as tolerated. Pelvic rest for 6 weeks.  Diet: low salt diet Anticipated Birth Control: not discussed Postpartum Appointment:6 weeks Additional Postpartum F/U: Postpartum Depression checkup Future Appointments:No future appointments. Follow up Visit:  Follow-up Information    Brien Few, MD. Schedule  an appointment as soon as possible for a visit in 6 week(s).   Specialty: Obstetrics and Gynecology Why: Postpartum visit  Contact information: [address] [place] [address] [phone]                   03/14/2021 Darliss Cheney, CNM

## 2021-03-15 ENCOUNTER — Inpatient Hospital Stay (HOSPITAL_COMMUNITY): Admit: 2021-03-15 | Payer: BC Managed Care – PPO | Admitting: Obstetrics and Gynecology

## 2021-03-15 ENCOUNTER — Other Ambulatory Visit (HOSPITAL_COMMUNITY): Payer: BC Managed Care – PPO

## 2021-03-15 ENCOUNTER — Encounter (HOSPITAL_COMMUNITY): Admission: RE | Admit: 2021-03-15 | Payer: BC Managed Care – PPO | Source: Ambulatory Visit

## 2021-03-15 HISTORY — DX: Complete placenta previa nos or without hemorrhage, unspecified trimester: O44.00

## 2021-03-15 LAB — SURGICAL PATHOLOGY

## 2021-03-15 LAB — TYPE AND SCREEN
ABO/RH(D): A POS
Antibody Screen: NEGATIVE
Unit division: 0
Unit division: 0

## 2021-03-15 LAB — BPAM RBC
Blood Product Expiration Date: 202205262359
Blood Product Expiration Date: 202205272359
ISSUE DATE / TIME: 202205090647
ISSUE DATE / TIME: 202205090647
Unit Type and Rh: 6200
Unit Type and Rh: 6200

## 2021-06-19 ENCOUNTER — Other Ambulatory Visit: Payer: Self-pay

## 2021-06-19 ENCOUNTER — Encounter (HOSPITAL_BASED_OUTPATIENT_CLINIC_OR_DEPARTMENT_OTHER): Payer: Self-pay | Admitting: Emergency Medicine

## 2021-06-19 DIAGNOSIS — Z79899 Other long term (current) drug therapy: Secondary | ICD-10-CM | POA: Insufficient documentation

## 2021-06-19 DIAGNOSIS — J45909 Unspecified asthma, uncomplicated: Secondary | ICD-10-CM | POA: Insufficient documentation

## 2021-06-19 DIAGNOSIS — R110 Nausea: Secondary | ICD-10-CM | POA: Diagnosis not present

## 2021-06-19 DIAGNOSIS — Z8616 Personal history of COVID-19: Secondary | ICD-10-CM | POA: Diagnosis not present

## 2021-06-19 DIAGNOSIS — R1013 Epigastric pain: Secondary | ICD-10-CM | POA: Insufficient documentation

## 2021-06-19 LAB — COMPREHENSIVE METABOLIC PANEL
ALT: 16 U/L (ref 0–44)
AST: 18 U/L (ref 15–41)
Albumin: 4.2 g/dL (ref 3.5–5.0)
Alkaline Phosphatase: 52 U/L (ref 38–126)
Anion gap: 10 (ref 5–15)
BUN: 18 mg/dL (ref 6–20)
CO2: 27 mmol/L (ref 22–32)
Calcium: 10.2 mg/dL (ref 8.9–10.3)
Chloride: 101 mmol/L (ref 98–111)
Creatinine, Ser: 0.98 mg/dL (ref 0.44–1.00)
GFR, Estimated: >60 mL/min (ref >60)
Glucose, Bld: 78 mg/dL (ref 70–99)
Potassium: 4 mmol/L (ref 3.5–5.1)
Sodium: 138 mmol/L (ref 135–145)
Total Bilirubin: 0.8 mg/dL (ref 0.3–1.2)
Total Protein: 6.9 g/dL (ref 6.5–8.1)

## 2021-06-19 LAB — URINALYSIS, ROUTINE W REFLEX MICROSCOPIC
Bilirubin Urine: NEGATIVE
Glucose, UA: NEGATIVE mg/dL
Hgb urine dipstick: NEGATIVE
Ketones, ur: NEGATIVE mg/dL
Nitrite: NEGATIVE
Specific Gravity, Urine: 1.031 — ABNORMAL HIGH (ref 1.005–1.030)
pH: 5.5 (ref 5.0–8.0)

## 2021-06-19 LAB — CBC
HCT: 39.8 % (ref 36.0–46.0)
Hemoglobin: 13.2 g/dL (ref 12.0–15.0)
MCH: 30.2 pg (ref 26.0–34.0)
MCHC: 33.2 g/dL (ref 30.0–36.0)
MCV: 91.1 fL (ref 80.0–100.0)
Platelets: 330 10*3/uL (ref 150–400)
RBC: 4.37 MIL/uL (ref 3.87–5.11)
RDW: 12.8 % (ref 11.5–15.5)
WBC: 11.6 10*3/uL — ABNORMAL HIGH (ref 4.0–10.5)
nRBC: 0 % (ref 0.0–0.2)

## 2021-06-19 LAB — LIPASE, BLOOD: Lipase: 26 U/L (ref 11–51)

## 2021-06-19 LAB — PREGNANCY, URINE: Preg Test, Ur: NEGATIVE

## 2021-06-19 NOTE — ED Triage Notes (Addendum)
Upper abd pain x 8 hours with nausea. States she has had this pain a few times over the last few weeks but this time it is not going away. Pain started after eating. Recent caesarean 03/2021

## 2021-06-20 ENCOUNTER — Ambulatory Visit (HOSPITAL_BASED_OUTPATIENT_CLINIC_OR_DEPARTMENT_OTHER)
Admit: 2021-06-20 | Discharge: 2021-06-20 | Disposition: A | Discharge status: Home / Self Care | Payer: BC Managed Care – PPO | Attending: Emergency Medicine | Admitting: Emergency Medicine

## 2021-06-20 ENCOUNTER — Emergency Department (HOSPITAL_BASED_OUTPATIENT_CLINIC_OR_DEPARTMENT_OTHER): Payer: BC Managed Care – PPO

## 2021-06-20 ENCOUNTER — Encounter (HOSPITAL_BASED_OUTPATIENT_CLINIC_OR_DEPARTMENT_OTHER): Payer: Self-pay

## 2021-06-20 ENCOUNTER — Emergency Department (HOSPITAL_BASED_OUTPATIENT_CLINIC_OR_DEPARTMENT_OTHER)
Admission: EM | Admit: 2021-06-20 | Discharge: 2021-06-20 | Disposition: A | Discharge status: Home / Self Care | Payer: BC Managed Care – PPO | Attending: Emergency Medicine | Admitting: Emergency Medicine

## 2021-06-20 DIAGNOSIS — R1013 Epigastric pain: Secondary | ICD-10-CM

## 2021-06-20 MED ORDER — ACETAMINOPHEN 325 MG PO TABS
650.0000 mg | ORAL_TABLET | Freq: Once | ORAL | Status: AC
  Administered 2021-06-20: 650 mg via ORAL
  Filled 2021-06-20: qty 2

## 2021-06-20 MED ORDER — IOHEXOL 350 MG/ML SOLN
100.0000 mL | Freq: Once | INTRAVENOUS | Status: AC | PRN
  Administered 2021-06-20: 60 mL via INTRAVENOUS

## 2021-06-20 NOTE — ED Notes (Signed)
Patient transported to CT at this Time. ?

## 2021-06-20 NOTE — ED Notes (Signed)
Patient returned from CT Examination.

## 2021-06-20 NOTE — ED Notes (Signed)
This RN presented the AVS utilizing Teachback Method. Patient verbalizes understanding of Discharge Instructions. Opportunity for Questioning and Answers were provided. Patient Discharged from ED ambulatory to Home.   

## 2021-06-20 NOTE — ED Provider Notes (Signed)
[facility] EMERGENCY DEPT Provider Note   CSN: [id number] Arrival date & time: 06/19/21  1830     History Chief Complaint  Patient presents with   Abdominal Pain    Rin Noviello is a 30 y.o. female.  HPI     This is a 30 year old female with a history of endometriosis who presents with abdominal pain.  Patient is currently 3 months postpartum following a cesarean section.  She states that she has had intermittent epigastric pain.  She states it is worse with eating.  She started to have pain after eating breakfast.  She reports that sharp nonradiating.  She reports nausea that comes in waves.  She states that the pain persisted longer today than it has.  She took Tylenol, Gas-X, and Tums with minimal relief.  Currently rates her pain at 3 out of 10.  Past Medical History:  Diagnosis Date   Allergic rhinitis    Allergy    Asthma    Endometriosis    Placenta previa    Thyroid nodule     Patient Active Problem List   Diagnosis Date Noted   Postpartum care following cesarean delivery (5/7) 03/13/2021   Acute blood loss as cause of postoperative anemia 03/13/2021   Placenta previa antepartum in third trimester 03/07/2021   Gestational diabetes mellitus (GDM), antepartum 01/19/2021   Personal history of COVID-19 04/06/2020   Cesarean delivery 09/29/2018   Leukocytosis 03/28/2017   Thrombocytosis 03/28/2017   Prolonged Q-T interval on ECG 03/28/2017   Endometriosis 03/20/2017   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   CHROMOPERTUBATION Bilateral 03/20/2017   Procedure: CHROMOPERTUBATION;  Surgeon: Megan Salon, MD;  Location: [facility];  Service: Gynecology;  Laterality: Bilateral;  Fallopian tubes   CYSTOSCOPY N/A 03/20/2017   Procedure: CYSTOSCOPY;  Surgeon: Megan Salon, MD;  Location: [facility];  Service: Gynecology;  Laterality: N/A;   FOOT SURGERY Left    neuromona   LAPAROSCOPIC OVARIAN CYSTECTOMY Bilateral 03/20/2017   Procedure: LAPAROSCOPIC OVARIAN CYSTECTOMY;  Surgeon: Megan Salon, MD;  Location: [facility];  Service: Gynecology;  Laterality: Bilateral;   LAPAROSCOPIC UNILATERAL SALPINGECTOMY Right 03/20/2017   Procedure: LAPAROSCOPIC UNILATERAL SALPINGECTOMY;  Surgeon: Megan Salon, MD;  Location: [facility];  Service: Gynecology;  Laterality: Right;   LAPAROSCOPY Bilateral 03/20/2017   Procedure: LAPAROSCOPY OPERATIVE  WITH LYSIS OF ADHESIONS;  Surgeon: Megan Salon, MD;  Location: [facility];  Service: Gynecology;  Laterality: Bilateral;   WISDOM TOOTH EXTRACTION  03/28/2013   WISDOM TOOTH EXTRACTION       OB History     Gravida  3   Para  2   Term  0   Preterm  2   AB  1   Living  2      SAB  1   IAB  0   Ectopic  0   Multiple  0   Live Births  2           Family History  Problem Relation  Age of Onset   Breast cancer Mother 31        mastectomy, negative genetic testing   Thyroid disease Neg Hx    Cancer Mother     Social History   Tobacco Use   Smoking status: Never   Smokeless tobacco: Never  Vaping Use   Vaping Use: Never used  Substance Use Topics   Alcohol use: Not Currently   Drug use: No    Home Medications Prior to Admission medications   Medication Sig Start Date End Date Taking? Authorizing Provider  acetaminophen (TYLENOL) 500 MG tablet Take 2 tablets (1,000 mg total) by mouth every 6 (six) hours. 03/14/21   Sigmon, Tyler Deis, CNM  calcium carbonate (TUMS - DOSED IN MG ELEMENTAL  CALCIUM) 500 MG chewable tablet Chew 1-2 tablets by mouth 3 (three) times daily as needed for indigestion or heartburn.    [provider]  coconut oil OIL Apply 1 application topically as needed. 03/14/21   Sigmon, Tyler Deis, CNM  ferrous sulfate 325 (65 FE) MG tablet Take 1 tablet (325 mg total) by mouth daily. 03/14/21 03/14/22  Sigmon, Tyler Deis, CNM  ibuprofen (ADVIL) 600 MG tablet Take 1 tablet (600 mg total) by mouth every 6 (six) hours. 03/14/21   Sigmon, Tyler Deis, CNM  loratadine (CLARITIN) 10 MG tablet Take 10 mg by mouth daily as needed for allergies.    [provider]  Prenatal Vit-Fe Fumarate-FA (PRENATAL VITAMIN PO) Take 1 tablet by mouth in the morning.    [provider]    Allergies    Shellfish allergy, Augmentin [amoxicillin-pot clavulanate], Ciprofloxacin, Wound dressing adhesive, and Clavulanic acid  Review of Systems   Review of Systems  Constitutional:  Negative for fever.  Respiratory:  Negative for shortness of breath.   Cardiovascular:  Negative for chest pain.  Gastrointestinal:  Positive for abdominal pain and nausea. Negative for vomiting.  All other systems reviewed and are negative.  Physical Exam Updated Vital Signs BP 109/70 (BP Location: Right Arm)   Pulse 68   Temp 98.3 F (36.8 C) (Oral)   Resp 16   Ht 1.575 m ('5\' 2"'$ )   Wt 56.7 kg   SpO2 100%   Breastfeeding Yes   BMI 22.86 kg/m   Physical Exam Vitals and nursing note reviewed.  Constitutional:      Appearance: She is well-developed. She is not ill-appearing.  HENT:     Head: Normocephalic and atraumatic.     Mouth/Throat:     Mouth: Mucous membranes are moist.  Eyes:     Pupils: Pupils are equal, round, and reactive to light.  Cardiovascular:     Rate and Rhythm: Normal rate and regular rhythm.     Heart sounds: Normal heart sounds.  Pulmonary:     Effort: Pulmonary effort is normal. No respiratory distress.     Breath sounds: No wheezing.  Abdominal:      General: Bowel sounds are normal.     Palpations: Abdomen is soft.     Tenderness: There is abdominal tenderness. Negative signs include Murphy's sign and Rovsing's sign.     Comments: Minimal epigastric tenderness to palpation, no rebound or guarding  Musculoskeletal:     Cervical back: Neck supple.  Skin:    General: Skin is warm and dry.  Neurological:     General: No focal deficit present.     Mental Status: She is alert and oriented to person, place, and time.  Psychiatric:  Mood and Affect: Mood normal.    ED Results / Procedures / Treatments   Labs (all labs ordered are listed, but only abnormal results are displayed) Labs Reviewed  CBC - Abnormal; Notable for the following components:      Result Value   WBC 11.6 (*)    All other components within normal limits  URINALYSIS, ROUTINE W REFLEX MICROSCOPIC - Abnormal; Notable for the following components:   Specific Gravity, Urine 1.031 (*)    Protein, ur TRACE (*)    Leukocytes,Ua TRACE (*)    All other components within normal limits  LIPASE, BLOOD  COMPREHENSIVE METABOLIC PANEL  PREGNANCY, URINE    EKG None  Radiology CT ABDOMEN PELVIS W CONTRAST  Result Date: 06/20/2021 CLINICAL DATA:  Upper abdominal pain, nausea EXAM: CT ABDOMEN AND PELVIS WITH CONTRAST TECHNIQUE: Multidetector CT imaging of the abdomen and pelvis was performed using the standard protocol following bolus administration of intravenous contrast. CONTRAST:  79m OMNIPAQUE IOHEXOL 350 MG/ML SOLN COMPARISON:  02/20/2018 FINDINGS: Lower chest: Lung bases are clear. Hepatobiliary: Liver is within normal limits. Gallbladder is unremarkable. No intrahepatic or extrahepatic ductal dilatation. Pancreas: Within normal limits. Spleen: Within normal limits. Adrenals/Urinary Tract: Adrenal glands are within normal limits. 9 mm right lower pole renal cyst (series 2/image 31). Left kidney is within normal limits. No hydronephrosis. Bladder is within normal  limits. Stomach/Bowel: Stomach is within normal limits. No evidence of bowel obstruction. Normal appendix (coronal image 42). No colonic wall thickening or inflammatory changes. Vascular/Lymphatic: No evidence of abdominal aortic aneurysm. Mildly prominent left gonadal vein. No suspicious abdominal lymphadenopathy. Reproductive: Uterus is within normal limits. No adnexal masses. Other: No abdominopelvic ascites. Musculoskeletal: Grade 1 spondylolisthesis at L5-S1. IMPRESSION: No evidence of bowel obstruction.  Normal appendix. No CT findings to account for the patient's upper abdominal pain. Electronically Signed   By: SJulian HyM.D.   On: 06/20/2021 01:29    Procedures Procedures   Medications Ordered in ED Medications  acetaminophen (TYLENOL) tablet 650 mg (650 mg Oral Given 06/20/21 0048)  iohexol (OMNIPAQUE) 350 MG/ML injection 100 mL (60 mLs Intravenous Contrast Given 06/20/21 0111)    ED Course  I have reviewed the triage vital signs and the nursing notes.  Pertinent labs & imaging results that were available during my care of the patient were reviewed by me and considered in my medical decision making (see chart for details).    MDM Rules/Calculators/A&P                           Patient presents with epigastric right upper quadrant abdominal pain.  She is overall nontoxic and vital signs are reassuring.  Pain is associated with eating.  Given her age recent pregnancy as well as history, highly suspicious for gallbladder pathology.  Other etiologies include gastritis, reflux, pancreatitis.  Patient reports that she is fairly well controlled from a pain perspective at this time.  Labs were reviewed.  No evidence of elevated LFTs or lipase.  Slight leukocytosis to 11.6.  Abdominal exam is relatively nontender although she has some very mild tenderness in the epigastrium.  Discussed imaging options with the patient.  Do not have access to ultrasound at this time.  Patient elected to  proceed with CT imaging.  CT does not show any obvious intra-abdominal pathology.  I discussed with the patient that I felt that ultrasound imaging would be more conclusive potentially.  Patient has remained pain controlled.  Will schedule for outpatient ultrasound.  After history, exam, and medical workup I feel the patient has been appropriately medically screened and is safe for discharge home. Pertinent diagnoses were discussed with the patient. Patient was given return precautions.  Final Clinical Impression(s) / ED Diagnoses Final diagnoses:  Epigastric pain    Rx / DC Orders ED Discharge Orders          Ordered    US Abdomen Limited RUQ/Gall Bladder        06/20/21 0221             Merryl Hacker, MD 06/20/21 [phone]

## 2021-06-20 NOTE — Discharge Instructions (Addendum)
You were seen today for abdominal pain.  This is highly suspect for biliary colic related to your gallbladder.  Return for ultrasound.  If you have new or worsening pain in the meantime, he should be reevaluated.  You were provided with general surgery follow-up.

## 2021-06-24 ENCOUNTER — Ambulatory Visit (HOSPITAL_BASED_OUTPATIENT_CLINIC_OR_DEPARTMENT_OTHER): Admission: RE | Admit: 2021-06-24 | Payer: BC Managed Care – PPO | Source: Ambulatory Visit

## 2021-06-24 ENCOUNTER — Ambulatory Visit (HOSPITAL_BASED_OUTPATIENT_CLINIC_OR_DEPARTMENT_OTHER): Payer: BC Managed Care – PPO

## 2021-07-15 ENCOUNTER — Ambulatory Visit (HOSPITAL_BASED_OUTPATIENT_CLINIC_OR_DEPARTMENT_OTHER): Payer: BC Managed Care – PPO | Admitting: Obstetrics & Gynecology

## 2021-07-15 ENCOUNTER — Encounter (HOSPITAL_BASED_OUTPATIENT_CLINIC_OR_DEPARTMENT_OTHER): Payer: Self-pay | Admitting: Obstetrics & Gynecology

## 2021-07-15 ENCOUNTER — Other Ambulatory Visit: Payer: Self-pay

## 2021-07-15 ENCOUNTER — Other Ambulatory Visit (HOSPITAL_COMMUNITY)
Admission: RE | Admit: 2021-07-15 | Discharge: 2021-07-15 | Disposition: A | Discharge status: Home / Self Care | Payer: BC Managed Care – PPO | Source: Ambulatory Visit | Attending: Obstetrics & Gynecology | Admitting: Obstetrics & Gynecology

## 2021-07-15 VITALS — BP 110/75 | HR 86 | Ht 62.0 in | Wt 119.8 lb

## 2021-07-15 DIAGNOSIS — N94819 Vulvodynia, unspecified: Secondary | ICD-10-CM | POA: Insufficient documentation

## 2021-07-15 DIAGNOSIS — K920 Hematemesis: Secondary | ICD-10-CM | POA: Diagnosis not present

## 2021-07-15 DIAGNOSIS — Z118 Encounter for screening for other infectious and parasitic diseases: Secondary | ICD-10-CM | POA: Diagnosis not present

## 2021-07-15 MED ORDER — DULOXETINE HCL 20 MG PO CPEP: 20.0000 mg | ORAL_CAPSULE | Freq: Every day | ORAL | 1 refills | Status: DC

## 2021-07-15 NOTE — Progress Notes (Signed)
GYNECOLOGY  VISIT  CC:   vulvar pain/irritation  HPI: 30 y.o. G3P0212 Married WF here for complaint of vulvar pain.  This started about four to five months before her prior pregnancy.  She was treated initially by another provider for multiple yeast infections.  Prior to last pregnancy, she underwent a vulvar biopsy and skin culture that showed e coli.  She was treated for this.  Reports symptoms were better during pregnancy but now they are worse.  Worse with wearing pants or tighter clothes.    Currently she is pumping.  She is not bleeding.    Pt reports two month hx of mid epigastric pain that seems to be worse after eating.  Most severe pain seems to be after eating eggs but she's not completely that is the cause.  She's never had issues with eggs in the past.  [place] the [place] and had a migraine on the way.  This caused nausea and emesis.  When she threw up, there was blood in it.  She took a picture of this.  RUQ u/s is scheduled but not until November.  With blood in emesis, I feel she needs an upper endoscopy.  Has seen Dr. Collene Mares in the past.    GYNECOLOGIC HISTORY: No LMP recorded. 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   Multinodular goiter 05/29/2016   Allergic rhinitis     Past Medical History:  Diagnosis Date   Allergic rhinitis    Allergy    Asthma    Endometriosis    Migraine with aura    Placenta previa    Thyroid nodule     Past Surgical History:  Procedure Laterality Date   CESAREAN SECTION N/A 09/29/2018   Procedure: CESAREAN SECTION;  Surgeon: Brien Few, MD;  Location: [facility];  Service: Obstetrics;  Laterality: N/A;   CESAREAN SECTION N/A  03/12/2021   Procedure: Repeat CESAREAN SECTION/Possible Total Abdominal Hysterectomy;  Surgeon: Brien Few, MD;  Location: [facility];  Service: Obstetrics;  Laterality: N/A;  EDD: 04/10/21 Requests 2 hrs.   CHROMOPERTUBATION Bilateral 03/20/2017   Procedure: CHROMOPERTUBATION;  Surgeon: Megan Salon, MD;  Location: [facility];  Service: Gynecology;  Laterality: Bilateral;  Fallopian tubes   CYSTOSCOPY N/A 03/20/2017   Procedure: CYSTOSCOPY;  Surgeon: Megan Salon, MD;  Location: [facility];  Service: Gynecology;  Laterality: N/A;   FOOT SURGERY Left    neuromona   LAPAROSCOPIC OVARIAN CYSTECTOMY Bilateral 03/20/2017   Procedure: LAPAROSCOPIC OVARIAN CYSTECTOMY;  Surgeon: Megan Salon, MD;  Location: [facility];  Service: Gynecology;  Laterality: Bilateral;   LAPAROSCOPIC UNILATERAL SALPINGECTOMY Right 03/20/2017   Procedure: LAPAROSCOPIC UNILATERAL SALPINGECTOMY;  Surgeon: Megan Salon, MD;  Location: [facility];  Service: Gynecology;  Laterality: Right;   LAPAROSCOPY Bilateral 03/20/2017   Procedure: LAPAROSCOPY OPERATIVE  WITH LYSIS OF ADHESIONS;  Surgeon: Megan Salon, MD;  Location: [facility];  Service: Gynecology;  Laterality: Bilateral;   WISDOM TOOTH EXTRACTION  03/28/2013   WISDOM TOOTH EXTRACTION      MEDS:   Current Outpatient Medications on File Prior to Visit  Medication Sig Dispense Refill   cyanocobalamin 100 MCG tablet Take 100 mcg by mouth daily.  loratadine (CLARITIN) 10 MG tablet Take 10 mg by mouth daily as needed for allergies.     MAGNESIUM CITRATE PO Take by mouth.     Prenatal Vit-Fe Fumarate-FA (PRENATAL VITAMIN PO) Take 1 tablet by mouth in the morning.     No current facility-administered medications on file prior to visit.    ALLERGIES: Shellfish allergy, Augmentin [amoxicillin-pot clavulanate], Ciprofloxacin, Wound dressing adhesive, and Clavulanic acid  Family History  Problem Relation Age of Onset   Breast cancer Mother 79        mastectomy, negative genetic  testing   Thyroid disease Neg Hx    Cancer Mother     SH:  single, non smoker  Review of Systems  Gastrointestinal: Negative.   Genitourinary:  Negative for dysuria, vaginal bleeding, vaginal discharge and vaginal pain.       Vulvar pain   PHYSICAL EXAMINATION:    BP 110/75   Pulse 86   Ht '5\' 2"'$  (1.575 m)   Wt 119 lb 12.8 oz (54.3 kg)   Breastfeeding Yes   BMI 21.91 kg/m     General appearance: alert, cooperative and appears stated age Lymph:  no inguinal LAD noted  Pelvic: External genitalia:  mild erythema of inner labia majora              Urethra:  normal appearing urethra with no masses, tenderness or lesions              Bartholins and Skenes: normal                 Vagina: normal appearing vagina with normal color and discharge, no lesions              Cervix: no lesions            Chaperone, Octaviano Batty, CMA, was present for exam.  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  Risks with breast feeding reviewed.  Pt may decided to stop breast feeding and will let me know  2.  Hematemesis - have highly encouraged pt to follow up with Dr. Collene Mares, her gastroenterologist, for possible endoscopy.

## 2021-07-18 ENCOUNTER — Encounter (HOSPITAL_BASED_OUTPATIENT_CLINIC_OR_DEPARTMENT_OTHER): Payer: Self-pay | Admitting: Obstetrics & Gynecology

## 2021-07-18 LAB — CERVICOVAGINAL ANCILLARY ONLY
Bacterial Vaginitis (gardnerella): NEGATIVE
Candida Glabrata: NEGATIVE
Candida Vaginitis: NEGATIVE
Comment: NEGATIVE
Comment: NEGATIVE
Comment: NEGATIVE

## 2021-07-23 LAB — WOUND CULTURE

## 2021-07-25 ENCOUNTER — Telehealth (HOSPITAL_BASED_OUTPATIENT_CLINIC_OR_DEPARTMENT_OTHER): Payer: Self-pay | Admitting: Obstetrics & Gynecology

## 2021-07-25 NOTE — Telephone Encounter (Signed)
Patient called and would like for Dr.Miller to call her go over the result with her and want her to know that she stop taking the medication on Friday .

## 2021-07-27 NOTE — Telephone Encounter (Signed)
Jacqueline [name] can you please call this patient about her results .

## 2021-07-29 ENCOUNTER — Other Ambulatory Visit (HOSPITAL_BASED_OUTPATIENT_CLINIC_OR_DEPARTMENT_OTHER): Payer: Self-pay | Admitting: Obstetrics & Gynecology

## 2021-07-29 ENCOUNTER — Encounter (HOSPITAL_BASED_OUTPATIENT_CLINIC_OR_DEPARTMENT_OTHER): Payer: Self-pay

## 2021-07-29 ENCOUNTER — Ambulatory Visit (HOSPITAL_BASED_OUTPATIENT_CLINIC_OR_DEPARTMENT_OTHER): Payer: BC Managed Care – PPO

## 2021-07-29 MED ORDER — AMOXICILLIN-POT CLAVULANATE 875-125 MG PO TABS: 1.0000 | ORAL_TABLET | Freq: Two times a day (BID) | ORAL | 0 refills | Status: DC

## 2021-07-29 NOTE — Telephone Encounter (Signed)
Patient called today and would like for someone to please call her about her results .

## 2021-08-02 NOTE — Telephone Encounter (Signed)
Dr. Sabra Heck spoke with patient about results and recommendations. Patient will be coming in on 08/17/2021 @3 :15. tbw

## 2021-08-17 ENCOUNTER — Encounter (HOSPITAL_BASED_OUTPATIENT_CLINIC_OR_DEPARTMENT_OTHER): Payer: Self-pay | Admitting: Obstetrics & Gynecology

## 2021-08-17 ENCOUNTER — Other Ambulatory Visit: Payer: Self-pay

## 2021-08-17 ENCOUNTER — Ambulatory Visit (HOSPITAL_BASED_OUTPATIENT_CLINIC_OR_DEPARTMENT_OTHER): Payer: BC Managed Care – PPO | Admitting: Obstetrics & Gynecology

## 2021-08-17 ENCOUNTER — Ambulatory Visit: Payer: BC Managed Care – PPO | Attending: Obstetrics & Gynecology | Admitting: Physical Therapy

## 2021-08-17 ENCOUNTER — Encounter: Payer: Self-pay | Admitting: Physical Therapy

## 2021-08-17 VITALS — BP 101/71 | HR 78 | Ht 62.0 in | Wt 121.0 lb

## 2021-08-17 DIAGNOSIS — A498 Other bacterial infections of unspecified site: Secondary | ICD-10-CM

## 2021-08-17 DIAGNOSIS — R252 Cramp and spasm: Secondary | ICD-10-CM | POA: Diagnosis not present

## 2021-08-17 DIAGNOSIS — M6281 Muscle weakness (generalized): Secondary | ICD-10-CM | POA: Diagnosis present

## 2021-08-17 DIAGNOSIS — R279 Unspecified lack of coordination: Secondary | ICD-10-CM | POA: Diagnosis present

## 2021-08-17 NOTE — Patient Instructions (Addendum)
Moisturizers They are used in the vagina to hydrate the mucous membrane that make up the vaginal canal. Designed to keep a more normal acid balance (ph) Once placed in the vagina, it will last between two to three days.  Use 2-3 times per week at bedtime  Ingredients to avoid is glycerin and fragrance, can increase chance of infection Should not be used just before sex due to causing irritation Most are gels administered either in a tampon-shaped applicator or as a vaginal suppository. They are non-hormonal.   Types of Moisturizers(internal use)  Vitamin E vaginal suppositories- Whole foods, Amazon Moist Again Coconut oil- can break down condoms Julva- (Do no use if on Tamoxifen) amazon Yes moisturizer- amazon NeuEve Silk , NeuEve Silver for menopausal or over 65 (if have severe vaginal atrophy or cancer treatments use NeuEve Silk for  1 month than move to The Pepsi)- [employer], [place].com Olive and Bee intimate cream- www.oliveandbee.com.au Mae vaginal moisturizer- Amazon Aloe    Creams to use externally on the Vulva area Albertson's (good for for cancer patients that had radiation to the area)- [place] or [employer].FlyingBasics.com.br V-magic cream - amazon Julva-amazon Vital "V Wild Yam salve ( help moisturize and help with thinning vulvar area, does have [place] by Irwin Brakeman labial moisturizer (Amazon,  Coconut or olive oil aloe   Things to avoid in the vaginal area Do not use things to irritate the vulvar area No lotions just specialized creams for the vulva area- Neogyn, V-magic, No soaps; can use Aveeno or Calendula cleanser if needed. Must be gentle No deodorants No douches Good to sleep without underwear to let the vaginal area to air out No scrubbing: spread the lips to let warm water rinse over labias and pat dry   Lubrication Used for intercourse to reduce friction Avoid ones that  have glycerin, warming gels, tingling gels, icing or cooling gel, scented Avoid parabens due to a preservative similar to female sex hormone May need to be reapplied once or several times during sexual activity Can be applied to both partners genitals prior to vaginal penetration to minimize friction or irritation Prevent irritation and mucosal tears that cause post coital pain and increased the risk of vaginal and urinary tract infections Oil-based lubricants cannot be used with condoms due to breaking them down.  Least likely to irritate vaginal tissue.  Plant based-lubes are safe Silicone-based lubrication are thicker and last long and used for post-menopausal women  Vaginal Lubricators Here is a list of some suggested lubricators you can use for intercourse. Use the most hypoallergenic product.  You can place on you or your partner.  Slippery Stuff ( water based) Sylk or Sliquid Natural H2O ( good  if frequent UTI's)- walmart, amazon Sliquid organics silk-(aloe and silicone based ) [employer] (www.blossom-organics.com)- (aloe based ) Coconut oil, olive oil -not good with condoms  PJur Woman Nude- (water based) amazon Uberlube- ( silicon) [place] has an organic one Yes lubricant- (water based and has plant oil based similar to silicone) [employer] Platinum-Silicone, Target, [facility] Olive and Bee intimate cream-  www.oliveandbee.com.au Pink - [employer] stuff Erosense Sync- walmart, amazon Coconu- FailLinks.co.uk  Things to avoid in lubricants are glycerin, warming gels, tingling gels, icing or cooling  gels, and scented gels.  Also avoid Vaseline. KY jelly, Replens, and Astroglide contain chlorhexidine which kills good bacteria(lactobacilli)  Things to avoid in the vaginal area Do not use things  to irritate the vulvar area No lotions- see below Soaps you  can use :Aveeno, Calendula, Good Clean Love cleanser if needed. Must be gentle No deodorants No  douches Good to sleep without underwear to let the vaginal area to air out No scrubbing: spread the lips to let warm water rinse over labias and pat dry  Creams that can be used on the Vulva Area V [employer], walmart Vital V Wild Yam Salve Julva- [employer] Botanical Pro-Meno Wild Yam Cream Coconut oil, olive oil Cleo by [employer] labial moisturizer -Amazon,  [place] Releveum ( lidocaine) or Desert [employer] Yes Moisturizer     https://gentry.org/

## 2021-08-17 NOTE — Therapy (Signed)
[facility] @ [place], [place], [address] Phone: [phone]   Fax:  [phone]  Physical Therapy Evaluation  Patient Details  Name: Jacqueline [name] MRN: [id number] Date of Birth: Feb 28, 1991 Referring Provider (PT): Megan Salon, MD   Encounter Date: 08/17/2021   PT End of Session - 08/17/21 1703     Visit Number 1    Date for PT Re-Evaluation 12/18/21    Authorization Type BCBS    Authorization - Number of Visits 30    PT Start Time 0093    PT Stop Time 8182    PT Time Calculation (min) 43 min    Activity Tolerance Patient tolerated treatment well;Patient limited by pain    Behavior During Therapy [facility] for tasks assessed/performed             Past Medical History:  Diagnosis Date   Allergic rhinitis    Allergy    Asthma    Endometriosis    Migraine with aura    Placenta previa    Thyroid nodule     Past Surgical History:  Procedure Laterality Date   CESAREAN SECTION N/A 09/29/2018   Procedure: CESAREAN SECTION;  Surgeon: Brien Few, MD;  Location: [facility];  Service: Obstetrics;  Laterality: N/A;   CESAREAN SECTION N/A 03/12/2021   Procedure: Repeat CESAREAN SECTION/Possible Total Abdominal Hysterectomy;  Surgeon: Brien Few, MD;  Location: [facility];  Service: Obstetrics;  Laterality: N/A;  EDD: 04/10/21 Requests 2 hrs.   CHROMOPERTUBATION Bilateral 03/20/2017   Procedure: CHROMOPERTUBATION;  Surgeon: Megan Salon, MD;  Location: [facility];  Service: Gynecology;  Laterality: Bilateral;  Fallopian tubes   CYSTOSCOPY N/A 03/20/2017   Procedure: CYSTOSCOPY;  Surgeon: Megan Salon, MD;  Location: [facility];  Service: Gynecology;  Laterality: N/A;   FOOT SURGERY Left    neuromona   LAPAROSCOPIC OVARIAN CYSTECTOMY Bilateral 03/20/2017   Procedure: LAPAROSCOPIC OVARIAN CYSTECTOMY;  Surgeon: Megan Salon, MD;  Location: [facility];  Service: Gynecology;  Laterality: Bilateral;    LAPAROSCOPIC UNILATERAL SALPINGECTOMY Right 03/20/2017   Procedure: LAPAROSCOPIC UNILATERAL SALPINGECTOMY;  Surgeon: Megan Salon, MD;  Location: [facility];  Service: Gynecology;  Laterality: Right;   LAPAROSCOPY Bilateral 03/20/2017   Procedure: LAPAROSCOPY OPERATIVE  WITH LYSIS OF ADHESIONS;  Surgeon: Megan Salon, MD;  Location: [facility];  Service: Gynecology;  Laterality: Bilateral;   WISDOM TOOTH EXTRACTION  03/28/2013    There were no vitals filed for this visit.    Subjective Assessment - 08/17/21 1619     Subjective Pt reports 1 year history of vulva pain, chronic history of Endometriosis, chronic pain with intercourse and did see one PFPT in college for this but unable to return.    How long can you sit comfortably? with tight clothing discomfort the whole time    How long can you stand comfortably? no limits    How long can you walk comfortably? no limits    Patient Stated Goals to have less pain    Currently in Pain? 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  No    Is the patient reluctant to leave their home because of a fear of falling?  No      Home Ecologist  residence    Living Arrangements Spouse/significant other;Children      Prior Function   Level of Independence Independent    Vocation Full time employment    Museum/gallery curator      Cognition   Overall Cognitive Status Within Functional Limits for tasks assessed      Sensation   Light Touch Appears Intact      Coordination   Gross Motor Movements are Fluid and Coordinated Yes    Fine Motor Movements are Fluid and Coordinated Yes      Posture/Postural Control   Posture/Postural Control Postural limitations    Postural Limitations Rounded Shoulders;Posterior pelvic tilt      ROM / Strength   AROM / PROM / Strength AROM;Strength      AROM   Overall AROM Comments WFL      Strength   Overall Strength Comments bil hips globally 4/5      Flexibility   Soft Tissue Assessment /Muscle Length yes   bil hamstrings and adductors limited by 25%     Palpation   Spinal mobility no TTP    Palpation comment c-section scar mobility decreased in all directions throughout scar, no TTP throughout per pt externally.                   No emotional/communication barriers or cognitive limitation. Patient is motivated to learn. Patient understands and agrees with treatment goals and plan. PT explains patient will be examined in standing, sitting, and lying down to see how their muscles and joints work. When they are ready, they will be asked to remove their underwear so PT can examine their perineum. The patient is also given the option of providing their own chaperone as one is not provided in our facility. The patient also has the right and is explained the right to defer or refuse any part of the evaluation or treatment including the internal exam. With the patient's consent, PT will use one gloved finger to gently assess the muscles of the pelvic floor, seeing how well it contracts and relaxes and if there is muscle symmetry. After, the patient will get dressed and PT and  patient will discuss exam findings and plan of care. PT and patient discuss plan of care, schedule, attendance policy and HEP activities.       Objective measurements completed on examination: See above findings.     Pelvic Floor Special Questions - 08/17/21 0001     Prior Pelvic/Prostate Exam Yes    Are you Pregnant or attempting pregnancy? No    Prior Pregnancies Yes    Number of C-Sections 2   30yo  and 34months   Any difficulty with labor and deliveries Yes   traumatic pregnancies with multiple hospitalizations, placenta previas with abruption   Currently Sexually Active Yes    Is this Painful Yes    History of sexually transmitted disease No    Marinoff Scale pain prevents any attempts at intercourse   and stops from orgasming   Urinary Leakage No    Urinary urgency No    Urinary frequency no    Fecal incontinence No    Fluid intake usually drinks about 20z per day    Caffeine beverages yes - 1 cup coffee per day, 1 coke zero sometimes    Falling out feeling (prolapse) No    External Perineal Exam mild redness at vulva and introitus    External Palpation no TTP per pt with gentle touch    Prolapse None    Pelvic Floor Internal Exam patient identified and patient confirms consent for PT to perform internal soft tissue work and muscle strength and integrity assessment    Exam Type Vaginal    Sensation WFL    Palpation TTP mildly throughout pelvis, increased tenderness per pt at bulbocavernosus, ischiocavernosus, and levator ani    Strength weak squeeze, no lift   and with max cues, diffcult to differentiate between true weakness vs. high tone/poor coordination   Strength # of reps 2    Strength # of seconds 1    Tone increased tone throughout however poor coordination and ability to contract in general                       PT Education - 08/17/21 1650     Education Details Pt educated on exam findings, female anatomy, breathing mechanics, HEP, lubricants and  moisturizers    Person(s) Educated Patient    Methods Explanation;Demonstration;Tactile cues;Verbal cues;Handout    Comprehension Verbalized understanding;Returned demonstration              PT Short Term Goals - 08/17/21 1711       PT SHORT TERM GOAL #1   Title pt to be I with HEP    Time 4    Period Weeks    Status New    Target Date 09/14/21      PT SHORT TERM GOAL #2   Title pt to report no more than 4/10 pain with intercourse to improve QOL    Time 4    Period Weeks    Status New    Target Date 09/14/21      PT SHORT TERM GOAL #3   Title pt to demonstrate improved ability to coordinate pelvic floor contract/relaxation with breathing mechanics for all activities at least 50% of the time.    Time 4    Period Weeks    Status New    Target Date 09/14/21      PT SHORT TERM GOAL #4   Title pt to demonstrate 3/5 strength at pelvic floor to improve pelvic stability    Time 4    Period Weeks    Status New    Target Date 09/14/21               PT Long Term Goals - 08/17/21 1712       PT LONG TERM GOAL #1   Title pt to be I with advanced HEP    Time 4    Period Months    Status New    Target Date 12/18/21  PT LONG TERM GOAL #2   Title pt to report no more than 2/10 pain with intercourse to improve QOL    Time 4    Period Months    Status New    Target Date 12/18/21      PT LONG TERM GOAL #3   Title pt to demonstrate improved ability to coordinate pelvic floor contract/relaxation with breathing mechanics for all activities at least 75% of the time.    Time 4    Period Months    Status New    Target Date 12/18/21      PT LONG TERM GOAL #4   Title pt to demonstrate 3/5 strength at pelvic floor to improve pelvic stability    Time 4    Period Months    Status New    Target Date 12/18/21      PT LONG TERM GOAL #5   Title pt to demonstrate 5/5 bil hip strength for improved pelvic stability    Time 4    Period Months    Status New    Target  Date 12/18/21                    Plan - 08/17/21 1704     Clinical Impression Statement Pt is 30yo female presenting to clinic with chronic history of endometriosis, pelvic pain, pain with intercourse both deep and with penetration, vaginal dryness and irritation and 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 Pt also had x2 c-sections and 3 pregnancies with one miscarriage. Pt found to have weakness in bil hips, decreased mobility in bil hips, and poor c-section scar mobility, DRA with 1/2 finger width above umbilicus tapering to 1 below and first knuckle depth with noted resistance. Pt also found to have very poor coordination of pelvic floor with great difficulty with contraction/relaxation, 2/5 strength noted and poor endurance however it is difficult to differentiate between true weakness and pt's hypertonicity throughout pelvic floor and coordination deficits. Pt tolerated session well though did have increased pain with palpation throughout pelvic floor superficially and deeper layer of muscles and noted trigger points throughout. Pt would benefit from continued PT to address these deficits.    Personal Factors and Comorbidities Comorbidity 1;Time since onset of injury/illness/exacerbation    Comorbidities x2 c-sections, postpartum    Examination-Activity Limitations Sit   intercourse   Examination-Participation Restrictions Interpersonal Relationship;Community Activity;Occupation    Stability/Clinical Decision Making Evolving/Moderate complexity    Clinical Decision Making Moderate    Rehab Potential Good    PT Frequency 1x / week    PT Duration Other (comment)   20 weeks   PT Treatment/Interventions ADLs/Self Care Home Management;Aquatic Therapy;Functional mobility training;Therapeutic activities;Therapeutic exercise;Neuromuscular re-education;Manual techniques;Patient/family education;Passive range of motion;Energy conservation;Vasopneumatic Device;Taping;Biofeedback    PT Next Visit Plan go  over handouts, biofeedback    PT Home Exercise Plan [email or web address]    Consulted and Agree with Plan of Care Patient             Patient will benefit from skilled therapeutic intervention in order to improve the following deficits and impairments:  Decreased coordination, Decreased endurance, Increased fascial restricitons, Impaired tone, Pain, Decreased mobility, Decreased strength, Postural dysfunction, Improper body mechanics, Impaired flexibility  Visit Diagnosis: Cramp and spasm - Plan: PT plan of care cert/re-cert  Muscle weakness (generalized) - Plan: PT plan of care cert/re-cert  Lack of coordination - Plan: PT plan of care cert/re-cert     Problem List Patient Active Problem List   Diagnosis  Date Noted   Personal history of COVID-19 04/06/2020   Leukocytosis 03/28/2017   Thrombocytosis 03/28/2017   Prolonged Q-T interval on ECG 03/28/2017   Endometriosis 03/20/2017   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

## 2021-08-20 NOTE — Progress Notes (Signed)
GYNECOLOGY  VISIT  CC:   repeat testing  HPI: 30 y.o. G3P0212 Married   Unavailable White or Caucasian female here for repeat skin culture.  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  Starting pelvic PT today.  Has appt after this one.  She has recent skin culture with proteus.  This was treated with antibiotics.  Reports skin feels better but she still cannot wear tight clothes.  Never started cymbalta.  Is breast feeding.  H/o endometriosis.  Pretty sure she is done with child bearing.  Not ready to stop breast feeding.  Is going to let me know when she is.  Will consider myfembree or like medication.  Pt is likely going to need hysterectomy at some point.  Understands will likely lose both ovaries and will need HRT.  Would like to hold off on this as long as possible to keep her ovaries in place.  Recommend MIGS surgeon for this when it is time to proceed due to complicated ovarian cystectomies needed prior to pregnancies.  Kids are doing great!!  GYNECOLOGIC HISTORY: No LMP recorded.  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   Multinodular goiter 05/29/2016   Allergic rhinitis     Past Medical History:  Diagnosis Date   Allergic rhinitis    Allergy    Asthma    Endometriosis    Migraine with aura    Placenta previa    Thyroid nodule     Past Surgical History:  Procedure Laterality Date   CESAREAN SECTION N/A 09/29/2018   Procedure: CESAREAN SECTION;  Surgeon: Brien Few, MD;  Location: [facility];  Service: Obstetrics;  Laterality: N/A;   CESAREAN SECTION N/A 03/12/2021   Procedure: Repeat CESAREAN SECTION/Possible Total Abdominal Hysterectomy;  Surgeon: Brien Few, MD;  Location: [facility];  Service: Obstetrics;  Laterality: N/A;  EDD: 04/10/21 Requests 2 hrs.    CHROMOPERTUBATION Bilateral 03/20/2017   Procedure: CHROMOPERTUBATION;  Surgeon: Megan Salon, MD;  Location: [facility];  Service: Gynecology;  Laterality: Bilateral;  Fallopian tubes   CYSTOSCOPY N/A 03/20/2017   Procedure: CYSTOSCOPY;  Surgeon: Megan Salon, MD;  Location: [facility];  Service: Gynecology;  Laterality: N/A;   FOOT SURGERY Left    neuromona   LAPAROSCOPIC OVARIAN CYSTECTOMY Bilateral 03/20/2017   Procedure: LAPAROSCOPIC OVARIAN CYSTECTOMY;  Surgeon: Megan Salon, MD;  Location: [facility];  Service: Gynecology;  Laterality: Bilateral;   LAPAROSCOPIC UNILATERAL SALPINGECTOMY Right 03/20/2017   Procedure: LAPAROSCOPIC UNILATERAL SALPINGECTOMY;  Surgeon: Megan Salon, MD;  Location: [facility];  Service: Gynecology;  Laterality: Right;   LAPAROSCOPY Bilateral 03/20/2017   Procedure: LAPAROSCOPY OPERATIVE  WITH LYSIS OF ADHESIONS;  Surgeon: Megan Salon, MD;  Location: [facility];  Service: Gynecology;  Laterality: Bilateral;   WISDOM TOOTH EXTRACTION  03/28/2013    MEDS:   Current Outpatient Medications on File Prior to Visit  Medication Sig Dispense Refill   loratadine (CLARITIN) 10 MG tablet Take 10 mg by mouth daily as needed for allergies.     Prenatal Vit-Fe Fumarate-FA (PRENATAL VITAMIN PO) Take 1 tablet by mouth in the morning.     amoxicillin-clavulanate (AUGMENTIN) 875-125 MG tablet Take 1 tablet by mouth 2 (two) times daily. 14 tablet 0   cyanocobalamin 100 MCG tablet Take 100 mcg by mouth daily.  DULoxetine (CYMBALTA) 20 MG capsule Take 1 capsule (20 mg total) by mouth daily. 30 capsule 1   MAGNESIUM CITRATE PO Take by mouth.     No current facility-administered medications on file prior to visit.    ALLERGIES: Shellfish allergy, Ciprofloxacin, and Wound dressing adhesive  Family History  Problem Relation Age of Onset   Breast cancer Mother 104        mastectomy, negative genetic testing   Thyroid disease Neg Hx    Cancer Mother     SH:  married, non  smoker  Review of Systems  Genitourinary: Negative.    PHYSICAL EXAMINATION:    BP 101/71 (BP Location: Right Arm, Patient Position: Sitting, Cuff Size: Normal)   Pulse 78   Ht 5\' 2"  (1.575 m) Comment: reported  Wt 121 lb (54.9 kg)   BMI 22.13 kg/m     General appearance: alert, cooperative and appears stated age Lymph:  no inguinal LAD noted  Pelvic: External genitalia:  no lesions              Urethra:  normal appearing urethra with no masses, tenderness or lesions              Bartholins and Skenes: normal                  Chaperone, Ezekiel Ina, RN, was present for exam.  Assessment/Plan: 1. Proteus infection - WOUND CULTURE  2.  Possible vulvodynia - staring pelvic PT today.  - Medications including cymbalta and elavil discussed.  Will not start until breast feeding is completed.  She will let me know when stops.  3.  Endometriosis - reviewed medication and surgical treatment with her today as well

## 2021-08-25 LAB — WOUND CULTURE

## 2021-09-16 ENCOUNTER — Encounter: Payer: BC Managed Care – PPO | Admitting: Physical Therapy

## 2021-09-16 ENCOUNTER — Encounter (HOSPITAL_BASED_OUTPATIENT_CLINIC_OR_DEPARTMENT_OTHER): Payer: Self-pay | Admitting: Obstetrics & Gynecology

## 2021-09-16 ENCOUNTER — Telehealth (HOSPITAL_BASED_OUTPATIENT_CLINIC_OR_DEPARTMENT_OTHER): Payer: Self-pay | Admitting: Obstetrics & Gynecology

## 2021-09-16 ENCOUNTER — Other Ambulatory Visit: Payer: Self-pay

## 2021-09-16 ENCOUNTER — Ambulatory Visit (INDEPENDENT_AMBULATORY_CARE_PROVIDER_SITE_OTHER): Payer: BC Managed Care – PPO | Admitting: Obstetrics & Gynecology

## 2021-09-16 ENCOUNTER — Other Ambulatory Visit (HOSPITAL_BASED_OUTPATIENT_CLINIC_OR_DEPARTMENT_OTHER): Payer: BC Managed Care – PPO

## 2021-09-16 VITALS — BP 106/70 | HR 78 | Ht 62.0 in | Wt 119.4 lb

## 2021-09-16 DIAGNOSIS — Z803 Family history of malignant neoplasm of breast: Secondary | ICD-10-CM | POA: Diagnosis not present

## 2021-09-16 DIAGNOSIS — R102 Pelvic and perineal pain: Secondary | ICD-10-CM | POA: Diagnosis not present

## 2021-09-16 DIAGNOSIS — Z01419 Encounter for gynecological examination (general) (routine) without abnormal findings: Secondary | ICD-10-CM

## 2021-09-16 DIAGNOSIS — N809 Endometriosis, unspecified: Secondary | ICD-10-CM | POA: Diagnosis not present

## 2021-09-16 DIAGNOSIS — R9431 Abnormal electrocardiogram [ECG] [EKG]: Secondary | ICD-10-CM | POA: Diagnosis not present

## 2021-09-16 NOTE — Progress Notes (Signed)
30 y.o. [id number] Married Caucasian female here for AEX.  Not cycling right now at all.  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  Has used nuva ring in the past and will want to go back on this method.  Has been experiencing vulvar pain.  She's had two skin cultures.  Dr. Graylon Good, ID, thinks this is most likely just skin colonization and no additional broad spectrum antibiotics recommended.  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  We have been unable to really try any treatments due to breast feeding.  Will need to transition to additional treatment for vulvodynia and possibly for endometriosis once breast feeding is completed.  She and I discussed referral to [facility] to see vulvodynia specialist as well.  She is open to this.  No LMP recorded.          Sexually active: Yes.    The current method of family planning is coitus interruptus.    Exercising: Yes.     Doing videos Smoker:  no  Health Maintenance: Pap:  05/12/2019 Negative History of abnormal Pap:  once but follow up was negative MMG:  guidelines reviewed.  Will start 10 years earlier than her mother was diagnosed with breast cancer (genetic testing was negative) Colonoscopy:  guidelines reviewed Screening Labs: not obtained today.  Just did life insurance update.     reports that she has never smoked. She has never used smokeless tobacco. She reports current alcohol use. She reports that she does not use drugs.  Past Medical History:  Diagnosis Date   Allergic rhinitis    Allergy    Asthma    Endometriosis    Migraine with aura    Placenta previa    Thyroid nodule     Past Surgical History:  Procedure Laterality Date   CESAREAN SECTION N/A 09/29/2018   Procedure: CESAREAN SECTION;  Surgeon: Brien Few, MD;  Location: [facility];  Service: Obstetrics;  Laterality: N/A;   CESAREAN SECTION N/A 03/12/2021   Procedure: Repeat CESAREAN SECTION/Possible Total Abdominal  Hysterectomy;  Surgeon: Brien Few, MD;  Location: [place] [facility];  Service: Obstetrics;  Laterality: N/A;  EDD: 04/10/21 Requests 2 hrs.   CHROMOPERTUBATION Bilateral 03/20/2017   Procedure: CHROMOPERTUBATION;  Surgeon: Megan Salon, MD;  Location: [facility];  Service: Gynecology;  Laterality: Bilateral;  Fallopian tubes   CYSTOSCOPY N/A 03/20/2017   Procedure: CYSTOSCOPY;  Surgeon: Megan Salon, MD;  Location: [facility];  Service: Gynecology;  Laterality: N/A;   FOOT SURGERY Left    neuromona   LAPAROSCOPIC OVARIAN CYSTECTOMY Bilateral 03/20/2017   Procedure: LAPAROSCOPIC OVARIAN CYSTECTOMY;  Surgeon: Megan Salon, MD;  Location: [facility];  Service: Gynecology;  Laterality: Bilateral;   LAPAROSCOPIC UNILATERAL SALPINGECTOMY Right 03/20/2017   Procedure: LAPAROSCOPIC UNILATERAL SALPINGECTOMY;  Surgeon: Megan Salon, MD;  Location: [facility];  Service: Gynecology;  Laterality: Right;   LAPAROSCOPY Bilateral 03/20/2017   Procedure: LAPAROSCOPY OPERATIVE  WITH LYSIS OF ADHESIONS;  Surgeon: Megan Salon, MD;  Location: [facility];  Service: Gynecology;  Laterality: Bilateral;   WISDOM TOOTH EXTRACTION  03/28/2013    Current Outpatient Medications  Medication Sig Dispense Refill   loratadine (CLARITIN) 10 MG tablet Take 10 mg by mouth daily as needed for allergies.     Prenatal Vit-Fe Fumarate-FA (PRENATAL VITAMIN PO) Take 1 tablet by mouth in the morning.     No current facility-administered medications for this visit.  Family History  Problem Relation Age of Onset   Breast cancer Mother 63        mastectomy, negative genetic testing   Thyroid disease Neg Hx    Cancer Mother     ROS:  All other ROS questions were negative  Exam:   BP 106/70 (BP Location: Right Arm, Patient Position: Sitting, Cuff Size: Normal)   Pulse 78   Ht 5\' 2"  (1.575 m) Comment: reported  Wt 119 lb 6.4 oz (54.2 kg)   BMI 21.84 kg/m   Height: 5\' 2"  (157.5 cm) (reported)  General appearance: alert, cooperative  and appears stated age Head: Normocephalic, without obvious abnormality, atraumatic Neck: no adenopathy, supple, symmetrical, trachea midline and thyroid normal to inspection and palpation Lungs: clear to auscultation bilaterally Breasts: normal appearance, no masses or tenderness Heart: regular rate and rhythm Abdomen: soft, non-tender; bowel sounds normal; no masses,  no organomegaly Extremities: extremities normal, atraumatic, no cyanosis or edema Skin: Skin color, texture, turgor normal. No rashes or lesions Lymph nodes: Cervical, supraclavicular, and axillary nodes normal. No abnormal inguinal nodes palpated Neurologic: Grossly normal   Pelvic: External genitalia:  no lesions              Urethra:  normal appearing urethra with no masses, tenderness or lesions              Bartholins and Skenes: normal                 Vagina: normal appearing vagina with normal color and no discharge, no lesions              Cervix: no lesions              Pap taken: Yes.   Bimanual Exam:  Uterus:  normal size, contour, position, consistency, mobility, non-tender              Adnexa: normal adnexa and no mass, fullness, tenderness               Rectovaginal: Confirms               Anus:  normal sphincter tone, no lesions  Chaperone, Octaviano Batty, CMA, was present for exam.  Assessment/Plan: 1. Well woman exam with routine gynecological exam - pap smear obtained but will see if can get post partum records to see if Pap smear was obtained - breast cancer screening guidelines reviewed - no lab work obtained today - care gaps/vaccines reviewed and updated  2. Vulvar pain - referral to [facility] placed  3. Endometriosis - will start back on Nuva ring when breast feeding is completed  4. Prolonged Q-T interval on ECG  5.  Family history of breast cancer in mother (who did have negative genetic testing) - increased screening with pt discussed as well.  Will start at age 86 as her mother was  diagnosed at age 68.

## 2021-09-16 NOTE — Telephone Encounter (Signed)
No message needed patient show up to visit today.

## 2021-09-22 DIAGNOSIS — Z803 Family history of malignant neoplasm of breast: Secondary | ICD-10-CM

## 2021-09-22 HISTORY — DX: Family history of malignant neoplasm of breast: Z80.3

## 2021-09-28 ENCOUNTER — Ambulatory Visit: Payer: BC Managed Care – PPO | Attending: Obstetrics & Gynecology | Admitting: Physical Therapy

## 2021-09-28 ENCOUNTER — Other Ambulatory Visit: Payer: Self-pay

## 2021-09-28 ENCOUNTER — Encounter: Payer: Self-pay | Admitting: Physical Therapy

## 2021-09-28 DIAGNOSIS — R279 Unspecified lack of coordination: Secondary | ICD-10-CM | POA: Insufficient documentation

## 2021-09-28 DIAGNOSIS — M6281 Muscle weakness (generalized): Secondary | ICD-10-CM | POA: Insufficient documentation

## 2021-09-28 DIAGNOSIS — R252 Cramp and spasm: Secondary | ICD-10-CM | POA: Insufficient documentation

## 2021-09-28 NOTE — Therapy (Addendum)
[facility] @ [address] [place], [place], [address] Phone: [phone]   Fax:  [phone]  Physical Therapy Treatment  Patient Details  Name: Jacqueline [name] MRN: [id number] Date of Birth: 1991/01/29 Referring Provider (PT): Megan Salon, MD   Encounter Date: 09/28/2021   PT End of Session - 09/28/21 1232     Visit Number 2    Date for PT Re-Evaluation 12/18/21    Authorization Type BCBS    Authorization - Number of Visits 30    PT Start Time 1152   pt arrival time   PT Stop Time 1230    PT Time Calculation (min) 38 min    Activity Tolerance Patient tolerated treatment well;Patient limited by pain    Behavior During Therapy [facility] for tasks assessed/performed             Past Medical History:  Diagnosis Date   Allergic rhinitis    Allergy    Asthma    Endometriosis    Migraine with aura    Placenta previa    Thyroid nodule     Past Surgical History:  Procedure Laterality Date   CESAREAN SECTION N/A 09/29/2018   Procedure: CESAREAN SECTION;  Surgeon: Brien Few, MD;  Location: [facility];  Service: Obstetrics;  Laterality: N/A;   CESAREAN SECTION N/A 03/12/2021   Procedure: Repeat CESAREAN SECTION/Possible Total Abdominal Hysterectomy;  Surgeon: Brien Few, MD;  Location: [place];  Service: Obstetrics;  Laterality: N/A;  EDD: 04/10/21 Requests 2 hrs.   CHROMOPERTUBATION Bilateral 03/20/2017   Procedure: CHROMOPERTUBATION;  Surgeon: Megan Salon, MD;  Location: [facility];  Service: Gynecology;  Laterality: Bilateral;  Fallopian tubes   CYSTOSCOPY N/A 03/20/2017   Procedure: CYSTOSCOPY;  Surgeon: Megan Salon, MD;  Location: [facility];  Service: Gynecology;  Laterality: N/A;   FOOT SURGERY Left    neuromona   LAPAROSCOPIC OVARIAN CYSTECTOMY Bilateral 03/20/2017   Procedure: LAPAROSCOPIC OVARIAN CYSTECTOMY;  Surgeon: Megan Salon, MD;  Location: [facility];  Service: Gynecology;  Laterality:  Bilateral;   LAPAROSCOPIC UNILATERAL SALPINGECTOMY Right 03/20/2017   Procedure: LAPAROSCOPIC UNILATERAL SALPINGECTOMY;  Surgeon: Megan Salon, MD;  Location: [facility];  Service: Gynecology;  Laterality: Right;   LAPAROSCOPY Bilateral 03/20/2017   Procedure: LAPAROSCOPY OPERATIVE  WITH LYSIS OF ADHESIONS;  Surgeon: Megan Salon, MD;  Location: [facility];  Service: Gynecology;  Laterality: Bilateral;   WISDOM TOOTH EXTRACTION  03/28/2013    There were no vitals filed for this visit.   Subjective Assessment - 09/28/21 1156     Subjective Pt reports she continues to have pelvic pain, has gone back to see Dr. Sabra Heck and does still have bacteria growth but did not put pt on new antibotic at this time.    How long can you sit comfortably? with tight clothing discomfort the whole time    How long can you stand comfortably? no limits    How long can you walk comfortably? no limits    Patient Stated Goals to have less pain    Currently in Pain? No/denies                            Pelvic Floor Special Questions - 09/28/21 0001     Pelvic Floor Internal Exam patient identified and patient confirms consent for PT to perform internal soft tissue work and muscle strength and integrity assessment    Exam  Type Vaginal    Strength weak squeeze, no lift   see manual for treatment details.   Tone increased tone throughout however poor coordination and ability to contract in general               [facility] Adult PT Treatment/Exercise - 09/28/21 0001       Self-Care   Self-Care Other Self-Care Comments    Other Self-Care Comments  Pt educated on HEP and pelvic relaxation techniques. Also relaxing glutes instead of constant clenching posture.      Exercises   Exercises Lumbar;Knee/Hip      Manual Therapy   Manual Therapy Internal Pelvic Floor;Myofascial release    Internal Pelvic Floor moderate-large trigger points internally at Rt side of pelvis, tenderness throughout, trigger  point release completed internally with good effect and decreased pain post treatment. Pt also directed in internal pelvic strengthening exercises 2x10 with contractions and quick release needed to isolate pelvic floor muscle instead of core and glutes as first demonstrated. Pt tolerated well with cues and extra time.                     PT Education - 09/28/21 1232     Education Details Pt educated on pelvic relaxation, glute relaxation, HEP    Person(s) Educated Patient    Methods Explanation;Demonstration;Tactile cues;Verbal cues    Comprehension Verbalized understanding;Returned demonstration              PT Short Term Goals - 08/17/21 1711       PT SHORT TERM GOAL #1   Title pt to be I with HEP    Time 4    Period Weeks    Status New    Target Date 09/14/21      PT SHORT TERM GOAL #2   Title pt to report no more than 4/10 pain with intercourse to improve QOL    Time 4    Period Weeks    Status New    Target Date 09/14/21      PT SHORT TERM GOAL #3   Title pt to demonstrate improved ability to coordinate pelvic floor contract/relaxation with breathing mechanics for all activities at least 50% of the time.    Time 4    Period Weeks    Status New    Target Date 09/14/21      PT SHORT TERM GOAL #4   Title pt to demonstrate 3/5 strength at pelvic floor to improve pelvic stability    Time 4    Period Weeks    Status New    Target Date 09/14/21               PT Long Term Goals - 08/17/21 1712       PT LONG TERM GOAL #1   Title pt to be I with advanced HEP    Time 4    Period Months    Status New    Target Date 12/18/21      PT LONG TERM GOAL #2   Title pt to report no more than 2/10 pain with intercourse to improve QOL    Time 4    Period Months    Status New    Target Date 12/18/21      PT LONG TERM GOAL #3   Title pt to demonstrate improved ability to coordinate pelvic floor contract/relaxation with breathing mechanics for all  activities at least 75% of the time.    Time  4    Period Months    Status New    Target Date 12/18/21      PT LONG TERM GOAL #4   Title pt to demonstrate 3/5 strength at pelvic floor to improve pelvic stability    Time 4    Period Months    Status New    Target Date 12/18/21      PT LONG TERM GOAL #5   Title pt to demonstrate 5/5 bil hip strength for improved pelvic stability    Time 4    Period Months    Status New    Target Date 12/18/21                   Plan - 09/28/21 1233     Clinical Impression Statement Pt presents to clinic reported same pelvic pain with lidocane lubricant pain with sex much improved but did return post intercouse. Attempted pelvic relaxation but did not really think this helped, HEP given for pelvic and hip stretching and relaxation techniques and additional lubricant samples given. Pt consented to internal vaginal treatment this date, manual work for trigger points completed with good effect and decreased tension at end of session, and pelvic strengthening completed with pt continuing to demo weakness 2/5 but improved ability to contract with quick release techniques. Pt tolerated well and reported better understanding of pelvic floor contraction/relaxation. Pt would benefit from continued PT to address these deficits.    Personal Factors and Comorbidities Comorbidity 1;Time since onset of injury/illness/exacerbation    Comorbidities x2 c-sections, postpartum    Examination-Activity Limitations Sit   intercourse   Examination-Participation Restrictions Interpersonal Relationship;Community Activity;Occupation    Stability/Clinical Decision Making Evolving/Moderate complexity    Rehab Potential Good    PT Frequency 1x / week    PT Duration Other (comment)   20 weeks   PT Treatment/Interventions ADLs/Self Care Home Management;Aquatic Therapy;Functional mobility training;Therapeutic activities;Therapeutic exercise;Neuromuscular re-education;Manual  techniques;Patient/family education;Passive range of motion;Energy conservation;Vasopneumatic Device;Taping;Biofeedback    PT Next Visit Plan go over handouts, biofeedback    PT Home Exercise Plan [id number]    Consulted and Agree with Plan of Care Patient             Patient will benefit from skilled therapeutic intervention in order to improve the following deficits and impairments:  Decreased coordination, Decreased endurance, Increased fascial restricitons, Impaired tone, Pain, Decreased mobility, Decreased strength, Postural dysfunction, Improper body mechanics, Impaired flexibility  Visit Diagnosis: Cramp and spasm  Muscle weakness (generalized)  Lack of coordination     Problem List Patient Active Problem List   Diagnosis Date Noted   Family history of breast cancer 09/22/2021   Personal history of COVID-19 04/06/2020   Leukocytosis 03/28/2017   Thrombocytosis 03/28/2017   Prolonged Q-T interval on ECG 03/28/2017   Endometriosis 03/20/2017   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 Patient is motivated to learn. Patient understands and agrees with treatment goals and plan. PT explains patient will be examined in standing, sitting, and lying down to see how their muscles and joints work. When they are ready, they will be asked to remove their underwear so PT can examine their perineum. The patient is also given the option of providing their own chaperone as one is not provided in our facility. The patient also has the right and is explained the right to defer or refuse any part of the evaluation  or treatment including the internal exam. With the patient's consent, PT will use one gloved finger to gently assess the muscles of the pelvic floor, seeing how well it contracts and relaxes and if there is muscle symmetry. After, the patient will get dressed and PT and  patient will discuss exam findings and plan of care. PT and patient discuss plan of care, schedule, attendance policy and HEP activities.  Stacy Gardner, PT, DPT 09/29/2211:36 PM     PHYSICAL THERAPY DISCHARGE SUMMARY  Visits from Start of Care: 2  Current functional level related to goals / functional outcomes: Unable to formally assess as pt called after this last appointment (09/28/21) to request to be Dc'd from PT due to no further symptoms.    Remaining deficits: Unable to formally reassess however pt called to be Dc'd with reporting no further symptoms.    Education / Equipment: HEP   Patient agrees to discharge. Patient goals were partially met. Patient is being discharged due to being pleased with the current functional level. Thank you for the referral. Stacy Gardner, PT, DPT 12/19/224:32 PM    [facility] @ [address] [place], [place], [address] Phone: [phone]   Fax:  [phone]  Name: Jacqueline [name] MRN: [id number] Date of Birth: [birth year]

## 2021-10-10 ENCOUNTER — Other Ambulatory Visit (HOSPITAL_BASED_OUTPATIENT_CLINIC_OR_DEPARTMENT_OTHER): Payer: Self-pay | Admitting: Obstetrics & Gynecology

## 2021-10-10 ENCOUNTER — Encounter (HOSPITAL_BASED_OUTPATIENT_CLINIC_OR_DEPARTMENT_OTHER): Payer: Self-pay | Admitting: Obstetrics & Gynecology

## 2021-10-10 MED ORDER — TERCONAZOLE 0.4 % VA CREA: 1.0000 | TOPICAL_CREAM | Freq: Every day | VAGINAL | 0 refills | Status: DC

## 2021-10-10 MED ORDER — CEPHALEXIN 500 MG PO CAPS: 500.0000 mg | ORAL_CAPSULE | Freq: Four times a day (QID) | ORAL | 0 refills | Status: DC

## 2021-10-11 ENCOUNTER — Encounter: Payer: BC Managed Care – PPO | Admitting: Physical Therapy

## 2021-10-26 ENCOUNTER — Encounter (HOSPITAL_BASED_OUTPATIENT_CLINIC_OR_DEPARTMENT_OTHER): Payer: Self-pay | Admitting: Obstetrics & Gynecology

## 2021-10-26 ENCOUNTER — Encounter: Payer: BC Managed Care – PPO | Admitting: Physical Therapy

## 2021-11-09 ENCOUNTER — Encounter: Payer: BC Managed Care – PPO | Admitting: Physical Therapy

## 2021-11-23 ENCOUNTER — Encounter: Payer: BC Managed Care – PPO | Admitting: Physical Therapy

## 2021-12-04 ENCOUNTER — Encounter (HOSPITAL_BASED_OUTPATIENT_CLINIC_OR_DEPARTMENT_OTHER): Payer: Self-pay | Admitting: Obstetrics & Gynecology

## 2021-12-05 ENCOUNTER — Other Ambulatory Visit (HOSPITAL_BASED_OUTPATIENT_CLINIC_OR_DEPARTMENT_OTHER): Payer: Self-pay | Admitting: Obstetrics & Gynecology

## 2021-12-05 MED ORDER — NORETHIN ACE-ETH ESTRAD-FE 1-20 MG-MCG PO TABS: ORAL_TABLET | ORAL | 2 refills | Status: DC

## 2021-12-05 MED ORDER — AMITRIPTYLINE HCL 10 MG PO TABS: 10.0000 mg | ORAL_TABLET | Freq: Every day | ORAL | 0 refills | Status: DC

## 2021-12-15 ENCOUNTER — Other Ambulatory Visit: Payer: Self-pay

## 2021-12-15 ENCOUNTER — Ambulatory Visit (INDEPENDENT_AMBULATORY_CARE_PROVIDER_SITE_OTHER): Payer: BC Managed Care – PPO | Admitting: *Deleted

## 2021-12-15 ENCOUNTER — Other Ambulatory Visit (HOSPITAL_COMMUNITY)
Admission: RE | Admit: 2021-12-15 | Discharge: 2021-12-15 | Disposition: A | Discharge status: Home / Self Care | Payer: BC Managed Care – PPO | Source: Ambulatory Visit | Attending: Obstetrics & Gynecology | Admitting: Obstetrics & Gynecology

## 2021-12-15 DIAGNOSIS — N898 Other specified noninflammatory disorders of vagina: Secondary | ICD-10-CM

## 2021-12-15 NOTE — Progress Notes (Signed)
Pt here with complaints of vaginal itching/discharge. Pt given aptima swab and instructed on self-swabbing. Advised we would reach out to her once test has resulted.

## 2021-12-16 LAB — CERVICOVAGINAL ANCILLARY ONLY
Bacterial Vaginitis (gardnerella): NEGATIVE
Candida Glabrata: NEGATIVE
Candida Vaginitis: NEGATIVE
Comment: NEGATIVE
Comment: NEGATIVE
Comment: NEGATIVE

## 2021-12-21 ENCOUNTER — Other Ambulatory Visit (HOSPITAL_COMMUNITY)
Admission: RE | Admit: 2021-12-21 | Discharge: 2021-12-21 | Disposition: A | Discharge status: Home / Self Care | Payer: BC Managed Care – PPO | Source: Ambulatory Visit | Attending: Obstetrics & Gynecology | Admitting: Obstetrics & Gynecology

## 2021-12-21 ENCOUNTER — Ambulatory Visit (HOSPITAL_BASED_OUTPATIENT_CLINIC_OR_DEPARTMENT_OTHER): Payer: BC Managed Care – PPO

## 2021-12-21 DIAGNOSIS — N898 Other specified noninflammatory disorders of vagina: Secondary | ICD-10-CM | POA: Diagnosis not present

## 2021-12-21 NOTE — Progress Notes (Signed)
Patient came in today to do aptima self swab. She wants [place] to test for yeast and BV. She is complaint of itching. tbw

## 2021-12-22 ENCOUNTER — Other Ambulatory Visit (HOSPITAL_BASED_OUTPATIENT_CLINIC_OR_DEPARTMENT_OTHER): Payer: Self-pay | Admitting: Obstetrics & Gynecology

## 2021-12-22 MED ORDER — AMITRIPTYLINE HCL 10 MG PO TABS: 20.0000 mg | ORAL_TABLET | Freq: Every day | ORAL | 1 refills | Status: DC

## 2021-12-26 LAB — CERVICOVAGINAL ANCILLARY ONLY
Bacterial Vaginitis (gardnerella): NEGATIVE
Candida Glabrata: NEGATIVE
Candida Vaginitis: NEGATIVE
Comment: NEGATIVE
Comment: NEGATIVE
Comment: NEGATIVE

## 2021-12-29 ENCOUNTER — Other Ambulatory Visit (HOSPITAL_BASED_OUTPATIENT_CLINIC_OR_DEPARTMENT_OTHER): Payer: Self-pay | Admitting: Obstetrics & Gynecology

## 2021-12-29 ENCOUNTER — Ambulatory Visit (HOSPITAL_BASED_OUTPATIENT_CLINIC_OR_DEPARTMENT_OTHER): Payer: BC Managed Care – PPO

## 2021-12-29 ENCOUNTER — Other Ambulatory Visit: Payer: Self-pay

## 2021-12-29 DIAGNOSIS — N9089 Other specified noninflammatory disorders of vulva and perineum: Secondary | ICD-10-CM

## 2022-01-03 LAB — WOUND CULTURE

## 2022-01-10 ENCOUNTER — Other Ambulatory Visit (HOSPITAL_BASED_OUTPATIENT_CLINIC_OR_DEPARTMENT_OTHER): Payer: Self-pay | Admitting: Obstetrics & Gynecology

## 2022-01-10 MED ORDER — LIDOCAINE 5 % EX OINT: TOPICAL_OINTMENT | CUTANEOUS | 1 refills | Status: DC

## 2022-01-12 ENCOUNTER — Other Ambulatory Visit (HOSPITAL_BASED_OUTPATIENT_CLINIC_OR_DEPARTMENT_OTHER): Payer: Self-pay | Admitting: Obstetrics & Gynecology

## 2022-01-12 MED ORDER — MOMETASONE FUROATE 0.1 % EX OINT: TOPICAL_OINTMENT | Freq: Every day | CUTANEOUS | 0 refills | Status: DC

## 2022-01-12 MED ORDER — LIDOCAINE 5 % EX OINT: TOPICAL_OINTMENT | CUTANEOUS | 1 refills | Status: DC

## 2022-01-13 ENCOUNTER — Other Ambulatory Visit (HOSPITAL_BASED_OUTPATIENT_CLINIC_OR_DEPARTMENT_OTHER): Payer: Self-pay | Admitting: Obstetrics & Gynecology

## 2022-01-18 ENCOUNTER — Ambulatory Visit (HOSPITAL_BASED_OUTPATIENT_CLINIC_OR_DEPARTMENT_OTHER): Payer: BC Managed Care – PPO

## 2022-02-08 ENCOUNTER — Encounter (HOSPITAL_BASED_OUTPATIENT_CLINIC_OR_DEPARTMENT_OTHER): Payer: Self-pay | Admitting: Obstetrics & Gynecology

## 2022-08-10 ENCOUNTER — Other Ambulatory Visit (HOSPITAL_BASED_OUTPATIENT_CLINIC_OR_DEPARTMENT_OTHER): Payer: Self-pay | Admitting: *Deleted

## 2022-08-10 MED ORDER — NORETHIN ACE-ETH ESTRAD-FE 1-20 MG-MCG PO TABS: ORAL_TABLET | ORAL | 1 refills | Status: DC

## 2022-08-13 ENCOUNTER — Encounter (HOSPITAL_BASED_OUTPATIENT_CLINIC_OR_DEPARTMENT_OTHER): Payer: Self-pay | Admitting: Obstetrics & Gynecology

## 2022-09-07 ENCOUNTER — Other Ambulatory Visit (HOSPITAL_BASED_OUTPATIENT_CLINIC_OR_DEPARTMENT_OTHER): Payer: Self-pay | Admitting: Obstetrics & Gynecology

## 2022-09-07 ENCOUNTER — Telehealth (HOSPITAL_BASED_OUTPATIENT_CLINIC_OR_DEPARTMENT_OTHER): Payer: Self-pay | Admitting: *Deleted

## 2022-09-07 DIAGNOSIS — Z803 Family history of malignant neoplasm of breast: Secondary | ICD-10-CM

## 2022-09-07 DIAGNOSIS — N644 Mastodynia: Secondary | ICD-10-CM

## 2022-09-07 NOTE — Telephone Encounter (Signed)
Pt called with complaints of having a random pain in her right breast that comes and goes consistently. She is unable to determine if there is a lump associated with the pain. Pts mother with history of breast cancer. Advised that provider recommends diagnostic mammogram. Pt agrees with plan.

## 2022-09-15 ENCOUNTER — Ambulatory Visit
Admission: RE | Admit: 2022-09-15 | Discharge: 2022-09-15 | Disposition: A | Discharge status: Home / Self Care | Payer: BC Managed Care – PPO | Source: Ambulatory Visit | Attending: Obstetrics & Gynecology | Admitting: Obstetrics & Gynecology

## 2022-09-15 DIAGNOSIS — N644 Mastodynia: Secondary | ICD-10-CM

## 2022-09-15 DIAGNOSIS — Z803 Family history of malignant neoplasm of breast: Secondary | ICD-10-CM

## 2022-11-20 ENCOUNTER — Other Ambulatory Visit (HOSPITAL_COMMUNITY)
Admission: RE | Admit: 2022-11-20 | Discharge: 2022-11-20 | Disposition: A | Discharge status: Home / Self Care | Payer: BC Managed Care – PPO | Source: Ambulatory Visit | Attending: Obstetrics & Gynecology | Admitting: Obstetrics & Gynecology

## 2022-11-20 ENCOUNTER — Ambulatory Visit (INDEPENDENT_AMBULATORY_CARE_PROVIDER_SITE_OTHER): Payer: BC Managed Care – PPO | Admitting: Obstetrics & Gynecology

## 2022-11-20 ENCOUNTER — Encounter (HOSPITAL_BASED_OUTPATIENT_CLINIC_OR_DEPARTMENT_OTHER): Payer: Self-pay | Admitting: Obstetrics & Gynecology

## 2022-11-20 VITALS — BP 118/86 | HR 84 | Ht 62.0 in | Wt 128.8 lb

## 2022-11-20 DIAGNOSIS — Z124 Encounter for screening for malignant neoplasm of cervix: Secondary | ICD-10-CM | POA: Insufficient documentation

## 2022-11-20 DIAGNOSIS — G43109 Migraine with aura, not intractable, without status migrainosus: Secondary | ICD-10-CM

## 2022-11-20 DIAGNOSIS — Z8481 Family history of carrier of genetic disease: Secondary | ICD-10-CM

## 2022-11-20 DIAGNOSIS — N94819 Vulvodynia, unspecified: Secondary | ICD-10-CM

## 2022-11-20 DIAGNOSIS — Z01419 Encounter for gynecological examination (general) (routine) without abnormal findings: Secondary | ICD-10-CM

## 2022-11-20 DIAGNOSIS — Z8742 Personal history of other diseases of the female genital tract: Secondary | ICD-10-CM | POA: Diagnosis not present

## 2022-11-20 MED ORDER — NORETHINDRONE 0.35 MG PO TABS: 1.0000 | ORAL_TABLET | Freq: Every day | ORAL | 3 refills | Status: DC

## 2022-11-20 NOTE — Progress Notes (Unsigned)
32 y.o. [id number] Married Caucasian female here for AEX.  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  She did see a primary care provider at [facility].  Was advised to see a specialist.  That office is closed.  Is on combination OCPs.  Recommended stopping and starting progesterone only method due to risks of stroke.         Sexually active: Yes.    The current method of family planning is OCP (estrogen/progesterone).    Exercising: Yes.    At gym Smoker:  no  Health Maintenance: Pap:  05/12/2019 Negative History of abnormal Pap:  no MMG:  09/15/2022 Negative Colonoscopy:  guidelines reviewed Screening Labs: PCP options discussed   reports that she has never smoked. She has never used smokeless tobacco. She reports current alcohol use. She reports that she does not use drugs.  Past Medical History:  Diagnosis Date   Allergic rhinitis    Allergy    Asthma    Endometriosis    History of placenta previa    Ocular migraine    Thyroid nodule     Past Surgical History:  Procedure Laterality Date   CESAREAN SECTION N/A 09/29/2018   Procedure: CESAREAN SECTION;  Surgeon: Brien Few, MD;  Location: [facility];  Service: Obstetrics;  Laterality: N/A;   CESAREAN SECTION N/A 03/12/2021   Procedure: Repeat CESAREAN SECTION/Possible Total Abdominal Hysterectomy;  Surgeon: Brien Few, MD;  Location: [facility];  Service: Obstetrics;  Laterality: N/A;  EDD: 04/10/21 Requests 2 hrs.   CHROMOPERTUBATION Bilateral 03/20/2017   Procedure: CHROMOPERTUBATION;  Surgeon: Megan Salon, MD;  Location: [facility];  Service: Gynecology;  Laterality: Bilateral;  Fallopian tubes   CYSTOSCOPY N/A 03/20/2017   Procedure: CYSTOSCOPY;  Surgeon: Megan Salon, MD;  Location: [facility];  Service: Gynecology;  Laterality: N/A;   FOOT SURGERY Left    neuromona   LAPAROSCOPIC OVARIAN CYSTECTOMY Bilateral 03/20/2017   Procedure: LAPAROSCOPIC OVARIAN CYSTECTOMY;  Surgeon: Megan Salon, MD;  Location: [facility];  Service: Gynecology;  Laterality: Bilateral;   LAPAROSCOPIC UNILATERAL SALPINGECTOMY Right 03/20/2017   Procedure: LAPAROSCOPIC UNILATERAL SALPINGECTOMY;  Surgeon: Megan Salon, MD;  Location: [facility];  Service: Gynecology;  Laterality: Right;   LAPAROSCOPY Bilateral 03/20/2017   Procedure: LAPAROSCOPY OPERATIVE  WITH LYSIS OF ADHESIONS;  Surgeon: Megan Salon, MD;  Location: [facility];  Service: Gynecology;  Laterality: Bilateral;   WISDOM TOOTH EXTRACTION  03/28/2013    Current Outpatient Medications  Medication Sig Dispense Refill   loratadine (CLARITIN) 10 MG tablet Take 10 mg by mouth daily as needed for allergies.     norethindrone (MICRONOR) 0.35 MG tablet Take 1 tablet (0.35 mg total) by mouth daily. 84 tablet 3   amitriptyline (ELAVIL) 10 MG tablet Take 2 tablets (20 mg total) by mouth at bedtime. (Patient not taking: Reported on 11/20/2022) 60 tablet 1   lidocaine (XYLOCAINE) 5 % ointment Apply topically no more than once daily to alleviate pain (Patient not taking: Reported on 11/20/2022) 30 g 1   mometasone (ELOCON) 0.1 % ointment Apply topically daily. (Patient not taking: Reported on 11/20/2022) 45 g 0   No current facility-administered medications for this visit.    Family History  Problem Relation Age of Onset   Breast cancer Mother 42       mastectomy, negative genetic testing   Thyroid disease Neg Hx     ROS: Constitutional: negative Genitourinary:negative  Exam:   BP (!) 112/90 (BP Location: Right  Arm, Patient Position: Sitting, Cuff Size: Normal)   Pulse 84   Ht '5\' 2"'$  (1.575 m) Comment: Reported  Wt 128 lb 12.8 oz (58.4 kg)   BMI 23.56 kg/m   Height: '5\' 2"'$  (157.5 cm) (Reported)  General appearance: alert, cooperative and appears stated age Head: Normocephalic, without obvious abnormality, atraumatic Neck: no adenopathy, supple, symmetrical, trachea midline and thyroid normal to inspection and palpation Lungs: clear to auscultation bilaterally Breasts:  normal appearance, no masses or tenderness Heart: regular rate and rhythm Abdomen: soft, non-tender; bowel sounds normal; no masses,  no organomegaly Extremities: extremities normal, atraumatic, no cyanosis or edema Skin: Skin color, texture, turgor normal. No rashes or lesions Lymph nodes: Cervical, supraclavicular, and axillary nodes normal. No abnormal inguinal nodes palpated Neurologic: Grossly normal   Pelvic: External genitalia:  no lesions              Urethra:  normal appearing urethra with no masses, tenderness or lesions              Bartholins and Skenes: normal                 Vagina: normal appearing vagina with normal color and no discharge, no lesions              Cervix: no lesions              Pap taken: Yes.   Bimanual Exam:  Uterus:  normal size, contour, position, consistency, mobility, non-tender              Adnexa: normal adnexa and no mass, fullness, tenderness               Rectovaginal: Confirms               Anus:  normal sphincter tone, no lesions  Chaperone, Octaviano Batty, CMA, was present for exam.  Assessment/Plan: 1. Well woman exam with routine gynecological exam - Pap smear obtained today - Mammogram guidelines discussed in light of mother with breast cancer at age 42.  Will start around age 14 for her screening.  Breast MRI discussed as well. - Colonoscopy guidelines reviewed - lab work done with PCP but office closed.  Names for new PCP given. - vaccines reviewed/updated  2. Cervical cancer screening - Cytology - PAP( [facility])  3. History of endometriosis  4. Migraine with aura and without status migrainosus, not intractable - recommended pt stop combination OCPs now and transition to POPs.  Rx to pharmacy. - referral to specialist needed.  Will see if neurology or ophthalmology is best referral for pt  5. Vulvodynia - pt has stopped all treatments.  Doesn't feel anything helps so just figuring out how to live with it

## 2022-11-21 DIAGNOSIS — Z8481 Family history of carrier of genetic disease: Secondary | ICD-10-CM | POA: Insufficient documentation

## 2022-11-23 LAB — CYTOLOGY - PAP
Comment: NEGATIVE
Diagnosis: UNDETERMINED — AB
High risk HPV: NEGATIVE

## 2022-11-26 ENCOUNTER — Other Ambulatory Visit (HOSPITAL_BASED_OUTPATIENT_CLINIC_OR_DEPARTMENT_OTHER): Payer: Self-pay | Admitting: Obstetrics & Gynecology

## 2022-11-26 DIAGNOSIS — G43109 Migraine with aura, not intractable, without status migrainosus: Secondary | ICD-10-CM

## 2022-11-27 ENCOUNTER — Telehealth (HOSPITAL_BASED_OUTPATIENT_CLINIC_OR_DEPARTMENT_OTHER): Payer: Self-pay | Admitting: *Deleted

## 2022-11-27 NOTE — Telephone Encounter (Signed)
DOB verified. Informed pt that Dr. Sabra Heck spoke with Dr. Jaynee Eagles, neurologist, migraine specialist, and she is happy to see her. Advised that Dr. Cathren Laine office should reach out to her for scheduling. Advised that she also recommends that she see an opthalmologist. Pt desires referral.

## 2022-11-29 ENCOUNTER — Other Ambulatory Visit (HOSPITAL_BASED_OUTPATIENT_CLINIC_OR_DEPARTMENT_OTHER): Payer: Self-pay | Admitting: Obstetrics & Gynecology

## 2022-11-29 DIAGNOSIS — H547 Unspecified visual loss: Secondary | ICD-10-CM

## 2022-11-29 DIAGNOSIS — G43E09 Chronic migraine with aura, not intractable, without status migrainosus: Secondary | ICD-10-CM

## 2023-01-27 ENCOUNTER — Other Ambulatory Visit (HOSPITAL_BASED_OUTPATIENT_CLINIC_OR_DEPARTMENT_OTHER): Payer: Self-pay | Admitting: Obstetrics & Gynecology

## 2023-04-28 ENCOUNTER — Inpatient Hospital Stay (HOSPITAL_COMMUNITY)
Admission: AD | Admit: 2023-04-28 | Discharge: 2023-04-29 | Disposition: A | Discharge status: Home / Self Care | Payer: BC Managed Care – PPO | Attending: Obstetrics | Admitting: Obstetrics

## 2023-04-28 ENCOUNTER — Encounter (HOSPITAL_COMMUNITY): Payer: Self-pay | Admitting: *Deleted

## 2023-04-28 DIAGNOSIS — O021 Missed abortion: Secondary | ICD-10-CM | POA: Insufficient documentation

## 2023-04-28 DIAGNOSIS — Z3A01 Less than 8 weeks gestation of pregnancy: Secondary | ICD-10-CM

## 2023-04-28 DIAGNOSIS — O209 Hemorrhage in early pregnancy, unspecified: Secondary | ICD-10-CM

## 2023-04-28 DIAGNOSIS — Z6790 Unspecified blood type, Rh positive: Secondary | ICD-10-CM

## 2023-04-28 LAB — URINALYSIS, ROUTINE W REFLEX MICROSCOPIC
Bilirubin Urine: NEGATIVE
Glucose, UA: NEGATIVE mg/dL
Ketones, ur: NEGATIVE mg/dL
Nitrite: NEGATIVE
Protein, ur: NEGATIVE mg/dL
RBC / HPF: >50 RBC/hpf (ref 0–5)
Specific Gravity, Urine: 1.015 (ref 1.005–1.030)
pH: 6 (ref 5.0–8.0)

## 2023-04-28 LAB — POCT PREGNANCY, URINE: Preg Test, Ur: POSITIVE — AB

## 2023-04-28 NOTE — MAU Note (Signed)
Red Vaginal bleeding remains scant in amount without clots or tissue passed. Pt denies cramping or pain.

## 2023-04-28 NOTE — MAU Provider Note (Incomplete)
Chief Complaint: Back Pain and Vaginal Bleeding   Event Date/Time   First Provider Initiated Contact with Patient 04/28/23 2356      SUBJECTIVE HPI: Jacqueline [name] is a 32 y.o. [id number] at [redacted]w[redacted]d by {Ob dating:14516} who presents to maternity admissions reporting ***. She denies vaginal bleeding, vaginal itching/burning, urinary symptoms, h/a, dizziness, n/v, or fever/chills.     Location: *** Quality: *** Severity: ***/10 on pain scale Duration: *** Timing: *** Modifying factors: *** Associated signs and symptoms: ***  HPI  Past Medical History:  Diagnosis Date   Allergic rhinitis    Allergy    Asthma    Endometriosis    History of placenta previa    Ocular migraine    Thyroid nodule    Past Surgical History:  Procedure Laterality Date   CESAREAN SECTION N/A 09/29/2018   Procedure: CESAREAN SECTION;  Surgeon: Olivia Mackie, MD;  [facility]: [facility];  Service: Obstetrics;  Laterality: N/A;   CESAREAN SECTION N/A 03/12/2021   Procedure: Repeat CESAREAN SECTION/Possible Total Abdominal Hysterectomy;  Surgeon: Olivia Mackie, MD;  Location: [facility];  Service: Obstetrics;  Laterality: N/A;  EDD: 04/10/21 Requests 2 hrs.   CHROMOPERTUBATION Bilateral 03/20/2017   Procedure: CHROMOPERTUBATION;  Surgeon: Jerene Bears, MD;  Location: [facility];  Service: Gynecology;  Laterality: Bilateral;  Fallopian tubes   CYSTOSCOPY N/A 03/20/2017   Procedure: CYSTOSCOPY;  Surgeon: Jerene Bears, MD;  Location: [facility];  Service: Gynecology;  Laterality: N/A;   FOOT SURGERY Left    neuromona   LAPAROSCOPIC OVARIAN CYSTECTOMY Bilateral 03/20/2017   Procedure: LAPAROSCOPIC OVARIAN CYSTECTOMY;  Surgeon: Jerene Bears, MD;  Location: [facility];  Service: Gynecology;  Laterality: Bilateral;   LAPAROSCOPIC UNILATERAL SALPINGECTOMY Right 03/20/2017   Procedure: LAPAROSCOPIC UNILATERAL SALPINGECTOMY;  Surgeon: Jerene Bears, MD;  Location: [facility];  Service: Gynecology;  Laterality: Right;    LAPAROSCOPY Bilateral 03/20/2017   Procedure: LAPAROSCOPY OPERATIVE  WITH LYSIS OF ADHESIONS;  Surgeon: Jerene Bears, MD;  Location: [facility];  Service: Gynecology;  Laterality: Bilateral;   WISDOM TOOTH EXTRACTION  03/28/2013   Social History   Socioeconomic History   Marital status: Married    Spouse name: Not on file   Number of children: Not on file   Years of education: Not on file   Highest education level: Not on file  Occupational History   Not on file  Tobacco Use   Smoking status: Never   Smokeless tobacco: Never  Vaping Use   Vaping Use: Never used  Substance and Sexual Activity   Alcohol use: Not Currently    Comment: social   Drug use: No   Sexual activity: Yes    Partners: Male    Birth control/protection: None  Other Topics Concern   Not on file  Social History Narrative   ** Merged History Encounter **       Social Determinants of Health   Financial Resource Strain: Not on file  Food Insecurity: No Food Insecurity (01/19/2021)   Hunger Vital Sign    Worried About Running Out of Food in the Last Year: Never true    Ran Out of Food in the Last Year: Never true  Transportation Needs: No Transportation Needs (01/19/2021)   PRAPARE - Administrator, Civil Service (Medical): No    Lack of Transportation (Non-Medical): No  Physical Activity: Not on file  Stress: Not on file  Social Connections: Not on file  Intimate Partner Violence: Not  on file   No current facility-administered medications on file prior to encounter.   Current Outpatient Medications on File Prior to Encounter  Medication Sig Dispense Refill   loratadine (CLARITIN) 10 MG tablet Take 10 mg by mouth daily as needed for allergies.     Prenatal MV-Min-Fe Fum-FA-DHA (PRENATAL 1 PO) Take 1 tablet by mouth daily.     amitriptyline (ELAVIL) 10 MG tablet Take 2 tablets (20 mg total) by mouth at bedtime. (Patient not taking: Reported on 11/20/2022) 60 tablet 1   lidocaine (XYLOCAINE) 5 %  ointment Apply topically no more than once daily to alleviate pain (Patient not taking: Reported on 11/20/2022) 30 g 1   mometasone (ELOCON) 0.1 % ointment Apply topically daily. (Patient not taking: Reported on 11/20/2022) 45 g 0   norethindrone (MICRONOR) 0.35 MG tablet Take 1 tablet (0.35 mg total) by mouth daily. 84 tablet 3   Allergies  Allergen Reactions   Shellfish Allergy Anaphylaxis   Ciprofloxacin     Caused QT prolongation   Wound Dressing Adhesive Other (See Comments)    Had a cellulitis reaction.  Unsure if it was drape or dressing related    ROS:  Review of Systems   I have reviewed patient's Past Medical Hx, Surgical Hx, Family Hx, Social Hx, medications and allergies.   Physical Exam  Patient Vitals for the past 24 hrs:  BP Temp Pulse Resp Height Weight  04/28/23 2145 108/72 98.2 F (36.8 C) 82 18 5\' 2"  (1.575 m) 58.5 kg   Constitutional: Well-developed, well-nourished female in no acute distress.  Cardiovascular: normal rate Respiratory: normal effort GI: Abd soft, non-tender. Pos BS x 4 MS: Extremities nontender, no edema, normal ROM Neurologic: Alert and oriented x 4.  GU: Neg CVAT.  PELVIC EXAM: Cervix pink, visually closed, without lesion, scant white creamy discharge, vaginal walls and external genitalia normal Bimanual exam: Cervix 0/long/high, firm, anterior, neg CMT, uterus nontender, nonenlarged, adnexa without tenderness, enlargement, or mass  FHT *** by doppler  LAB RESULTS Results for orders placed or performed during the hospital encounter of 04/28/23 (from the past 24 hour(s))  Urinalysis, Routine w reflex microscopic -Urine, Clean Catch     Status: Abnormal   Collection Time: 04/28/23  9:50 PM  Result Value Ref Range   Color, Urine YELLOW YELLOW   APPearance CLEAR CLEAR   Specific Gravity, Urine 1.015 1.005 - 1.030   pH 6.0 5.0 - 8.0   Glucose, UA NEGATIVE NEGATIVE mg/dL   Hgb urine dipstick LARGE (A) NEGATIVE   Bilirubin Urine NEGATIVE  NEGATIVE   Ketones, ur NEGATIVE NEGATIVE mg/dL   Protein, ur NEGATIVE NEGATIVE mg/dL   Nitrite NEGATIVE NEGATIVE   Leukocytes,Ua TRACE (A) NEGATIVE   RBC / HPF >50 0 - 5 RBC/hpf   WBC, UA 6-10 0 - 5 WBC/hpf   Bacteria, UA RARE (A) NONE SEEN   Squamous Epithelial / HPF 0-5 0 - 5 /HPF  Pregnancy, urine POC     Status: Abnormal   Collection Time: 04/28/23  9:50 PM  Result Value Ref Range   Preg Test, Ur POSITIVE (A) NEGATIVE       IMAGING No results found.  MAU Management/MDM: Orders Placed This Encounter  Procedures   Urinalysis, Routine w reflex microscopic -Urine, Clean Catch   Pregnancy, urine POC    No orders of the defined types were placed in this encounter.   Consult ***.  Treatments in MAU included ***. Pt discharged with strict *** precautions.  ASSESSMENT  No diagnosis found.  PLAN Discharge home Allergies as of 04/28/2023       Reactions   Shellfish Allergy Anaphylaxis   Ciprofloxacin    Caused QT prolongation   Wound Dressing Adhesive Other (See Comments)   Had a cellulitis reaction.  Unsure if it was drape or dressing related     Med Rec must be completed prior to using this [facility]***        Sharen Counter Certified Nurse-Midwife 04/28/2023  11:56 PM

## 2023-04-29 ENCOUNTER — Inpatient Hospital Stay (HOSPITAL_COMMUNITY): Payer: BC Managed Care – PPO

## 2023-04-29 DIAGNOSIS — Z3A01 Less than 8 weeks gestation of pregnancy: Secondary | ICD-10-CM

## 2023-04-29 DIAGNOSIS — O209 Hemorrhage in early pregnancy, unspecified: Secondary | ICD-10-CM

## 2023-04-29 MED ORDER — MISOPROSTOL 200 MCG PO TABS: 600.0000 ug | ORAL_TABLET | Freq: Once | ORAL | 0 refills | Status: DC

## 2023-04-29 MED ORDER — TRAMADOL HCL 50 MG PO TABS: 50.0000 mg | ORAL_TABLET | Freq: Four times a day (QID) | ORAL | 0 refills | Status: DC | PRN

## 2023-04-29 MED ORDER — IBUPROFEN 600 MG PO TABS: 600.0000 mg | ORAL_TABLET | Freq: Four times a day (QID) | ORAL | 0 refills | Status: DC | PRN

## 2023-05-01 ENCOUNTER — Other Ambulatory Visit: Payer: Self-pay

## 2023-05-01 ENCOUNTER — Encounter (HOSPITAL_BASED_OUTPATIENT_CLINIC_OR_DEPARTMENT_OTHER): Payer: Self-pay | Admitting: Obstetrics and Gynecology

## 2023-05-01 ENCOUNTER — Other Ambulatory Visit: Payer: Self-pay | Admitting: Obstetrics and Gynecology

## 2023-05-01 NOTE — Progress Notes (Signed)
Spoke w/ via phone for pre-op interview---pt Lab needs dos----  none             Lab results------none COVID test -----patient states asymptomatic no test needed Arrive at -------1130 am 05-02-2023 NPO after MN NO Solid Food.  Clear liquids from MN until---1030 Med rec completed Medications to take morning of surgery -----none Diabetic medication -----n/a Patient instructed no nail polish to be worn day of surgery Patient instructed to bring photo id and insurance card day of surgery Patient aware to have Driver (ride ) / caregiver    [name] for 24 hours after surgery  Patient Special Instructions -----none Pre-Op special Instructions -----none Patient verbalized understanding of instructions that were given at this phone interview. Patient denies shortness of breath, chest pain, fever, cough at this phone interview.

## 2023-05-02 ENCOUNTER — Ambulatory Visit (HOSPITAL_BASED_OUTPATIENT_CLINIC_OR_DEPARTMENT_OTHER)
Admission: RE | Admit: 2023-05-02 | Discharge: 2023-05-02 | Disposition: A | Discharge status: Home / Self Care | Payer: BC Managed Care – PPO | Source: Ambulatory Visit | Attending: Obstetrics and Gynecology | Admitting: Obstetrics and Gynecology

## 2023-05-02 ENCOUNTER — Other Ambulatory Visit: Payer: Self-pay

## 2023-05-02 ENCOUNTER — Ambulatory Visit (HOSPITAL_BASED_OUTPATIENT_CLINIC_OR_DEPARTMENT_OTHER): Payer: BC Managed Care – PPO | Admitting: Anesthesiology

## 2023-05-02 ENCOUNTER — Encounter (HOSPITAL_BASED_OUTPATIENT_CLINIC_OR_DEPARTMENT_OTHER)
Admission: RE | Disposition: A | Discharge status: Home / Self Care | Payer: Self-pay | Source: Ambulatory Visit | Attending: Obstetrics and Gynecology

## 2023-05-02 ENCOUNTER — Encounter (HOSPITAL_BASED_OUTPATIENT_CLINIC_OR_DEPARTMENT_OTHER): Payer: Self-pay | Admitting: Obstetrics and Gynecology

## 2023-05-02 DIAGNOSIS — O021 Missed abortion: Secondary | ICD-10-CM | POA: Diagnosis present

## 2023-05-02 DIAGNOSIS — Z01818 Encounter for other preprocedural examination: Secondary | ICD-10-CM

## 2023-05-02 HISTORY — PX: DILATION AND EVACUATION: SHX1459

## 2023-05-02 HISTORY — DX: Personal history of other diseases of the respiratory system: Z87.09

## 2023-05-02 LAB — CBC
HCT: 40.2 % (ref 36.0–46.0)
Hemoglobin: 13.4 g/dL (ref 12.0–15.0)
MCH: 30.8 pg (ref 26.0–34.0)
MCHC: 33.3 g/dL (ref 30.0–36.0)
MCV: 92.4 fL (ref 80.0–100.0)
Platelets: 295 10*3/uL (ref 150–400)
RBC: 4.35 MIL/uL (ref 3.87–5.11)
RDW: 12.1 % (ref 11.5–15.5)
WBC: 9.3 10*3/uL (ref 4.0–10.5)
nRBC: 0 % (ref 0.0–0.2)

## 2023-05-02 LAB — TYPE AND SCREEN
ABO/RH(D): A POS
Antibody Screen: NEGATIVE

## 2023-05-02 SURGERY — DILATION AND EVACUATION, UTERUS
Anesthesia: General | Site: Uterus

## 2023-05-02 MED ORDER — PROPOFOL 10 MG/ML IV BOLUS
INTRAVENOUS | Status: DC | PRN
  Administered 2023-05-02: 200 mg via INTRAVENOUS

## 2023-05-02 MED ORDER — ACETAMINOPHEN 500 MG PO TABS
ORAL_TABLET | ORAL | Status: AC
  Filled 2023-05-02: qty 2

## 2023-05-02 MED ORDER — LACTATED RINGERS IV SOLN: INTRAVENOUS | Status: DC

## 2023-05-02 MED ORDER — ONDANSETRON HCL 4 MG/2ML IJ SOLN
INTRAMUSCULAR | Status: DC | PRN
  Administered 2023-05-02: 4 mg via INTRAVENOUS

## 2023-05-02 MED ORDER — OXYCODONE HCL 5 MG PO TABS: 5.0000 mg | ORAL_TABLET | Freq: Once | ORAL | Status: DC | PRN

## 2023-05-02 MED ORDER — CEFAZOLIN SODIUM-DEXTROSE 2-4 GM/100ML-% IV SOLN
INTRAVENOUS | Status: AC
  Filled 2023-05-02: qty 100

## 2023-05-02 MED ORDER — TRANEXAMIC ACID-NACL 1000-0.7 MG/100ML-% IV SOLN
INTRAVENOUS | Status: AC
  Filled 2023-05-02: qty 100

## 2023-05-02 MED ORDER — POVIDONE-IODINE 10 % EX SWAB: 2.0000 | Freq: Once | CUTANEOUS | Status: DC

## 2023-05-02 MED ORDER — OXYCODONE HCL 5 MG/5ML PO SOLN: 5.0000 mg | Freq: Once | ORAL | Status: DC | PRN

## 2023-05-02 MED ORDER — PHENYLEPHRINE 80 MCG/ML (10ML) SYRINGE FOR IV PUSH (FOR BLOOD PRESSURE SUPPORT)
PREFILLED_SYRINGE | INTRAVENOUS | Status: AC
  Filled 2023-05-02: qty 10

## 2023-05-02 MED ORDER — MIDAZOLAM HCL 2 MG/2ML IJ SOLN
INTRAMUSCULAR | Status: AC
  Filled 2023-05-02: qty 2

## 2023-05-02 MED ORDER — SCOPOLAMINE 1 MG/3DAYS TD PT72
1.0000 | MEDICATED_PATCH | TRANSDERMAL | Status: DC
  Administered 2023-05-02: 1.5 mg via TRANSDERMAL

## 2023-05-02 MED ORDER — DEXAMETHASONE SODIUM PHOSPHATE 10 MG/ML IJ SOLN
INTRAMUSCULAR | Status: DC | PRN
  Administered 2023-05-02: 10 mg via INTRAVENOUS

## 2023-05-02 MED ORDER — FENTANYL CITRATE (PF) 100 MCG/2ML IJ SOLN: 25.0000 ug | INTRAMUSCULAR | Status: DC | PRN

## 2023-05-02 MED ORDER — FENTANYL CITRATE (PF) 100 MCG/2ML IJ SOLN
INTRAMUSCULAR | Status: AC
  Filled 2023-05-02: qty 2

## 2023-05-02 MED ORDER — MIDAZOLAM HCL 2 MG/2ML IJ SOLN
INTRAMUSCULAR | Status: DC | PRN
  Administered 2023-05-02: 2 mg via INTRAVENOUS

## 2023-05-02 MED ORDER — SCOPOLAMINE 1 MG/3DAYS TD PT72
MEDICATED_PATCH | TRANSDERMAL | Status: AC
  Filled 2023-05-02: qty 1

## 2023-05-02 MED ORDER — BUPIVACAINE HCL 0.25 % IJ SOLN
INTRAMUSCULAR | Status: DC | PRN
  Administered 2023-05-02: 20 mL

## 2023-05-02 MED ORDER — FENTANYL CITRATE (PF) 100 MCG/2ML IJ SOLN
INTRAMUSCULAR | Status: DC | PRN
  Administered 2023-05-02: 50 ug via INTRAVENOUS

## 2023-05-02 MED ORDER — KETOROLAC TROMETHAMINE 30 MG/ML IJ SOLN
INTRAMUSCULAR | Status: DC | PRN
  Administered 2023-05-02: 30 mg via INTRAVENOUS

## 2023-05-02 MED ORDER — CEFAZOLIN SODIUM-DEXTROSE 2-4 GM/100ML-% IV SOLN
2.0000 g | INTRAVENOUS | Status: AC
  Administered 2023-05-02: 2 g via INTRAVENOUS

## 2023-05-02 MED ORDER — AMISULPRIDE (ANTIEMETIC) 5 MG/2ML IV SOLN: 10.0000 mg | Freq: Once | INTRAVENOUS | Status: DC | PRN

## 2023-05-02 MED ORDER — ACETAMINOPHEN 500 MG PO TABS
1000.0000 mg | ORAL_TABLET | Freq: Once | ORAL | Status: AC
  Administered 2023-05-02: 1000 mg via ORAL

## 2023-05-02 MED ORDER — LIDOCAINE 2% (20 MG/ML) 5 ML SYRINGE
INTRAMUSCULAR | Status: DC | PRN
  Administered 2023-05-02: 60 mg via INTRAVENOUS

## 2023-05-02 MED ORDER — 0.9 % SODIUM CHLORIDE (POUR BTL) OPTIME
TOPICAL | Status: DC | PRN
  Administered 2023-05-02: 500 mL

## 2023-05-02 SURGICAL SUPPLY — 24 items
CATH ROBINSON RED A/P 16FR (CATHETERS) ×1 IMPLANT
DRSG TELFA 3X8 NADH STRL (GAUZE/BANDAGES/DRESSINGS) ×1 IMPLANT
FILTER UTR ASPR ASSEMBLY (MISCELLANEOUS) ×1 IMPLANT
GAUZE 4X4 16PLY ~~LOC~~+RFID DBL (SPONGE) ×1 IMPLANT
GLOVE BIO SURGEON STRL SZ7 (GLOVE) IMPLANT
GLOVE BIO SURGEON STRL SZ7.5 (GLOVE) ×1 IMPLANT
GLOVE BIOGEL PI IND STRL 7.0 (GLOVE) IMPLANT
GOWN STRL REUS W/TWL LRG LVL3 (GOWN DISPOSABLE) IMPLANT
GOWN STRL REUS W/TWL XL LVL3 (GOWN DISPOSABLE) ×1 IMPLANT
HOSE CONNECTING 18IN BERKELEY (TUBING) ×1 IMPLANT
KIT BERKELEY 1ST TRI 3/8 NO TR (MISCELLANEOUS) IMPLANT
KIT BERKELEY 1ST TRIMESTER 3/8 (MISCELLANEOUS) ×2 IMPLANT
KIT TURNOVER CYSTO (KITS) ×1 IMPLANT
NS IRRIG 500ML POUR BTL (IV SOLUTION) IMPLANT
PACK VAGINAL MINOR WOMEN LF (CUSTOM PROCEDURE TRAY) ×1 IMPLANT
PAD OB MATERNITY 4.3X12.25 (PERSONAL CARE ITEMS) IMPLANT
PAD PREP 24X48 CUFFED NSTRL (MISCELLANEOUS) ×1 IMPLANT
SET BERKELEY SUCTION TUBING (SUCTIONS) ×1 IMPLANT
SLEEVE SCD COMPRESS KNEE MED (STOCKING) ×1 IMPLANT
TOWEL OR 17X24 6PK STRL BLUE (TOWEL DISPOSABLE) ×1 IMPLANT
VACURETTE 10 RIGID CVD (CANNULA) IMPLANT
VACURETTE 7MM CVD STRL WRAP (CANNULA) IMPLANT
VACURETTE 8 RIGID CVD (CANNULA) IMPLANT
VACURETTE 9 RIGID CVD (CANNULA) IMPLANT

## 2023-05-02 NOTE — Brief Op Note (Signed)
05/02/2023  1:47 PM  PATIENT:  Jacqueline [name]  32 y.o. female  PRE-OPERATIVE DIAGNOSIS:  Missed Abortion  POST-OPERATIVE DIAGNOSIS:  Missed Abortion  PROCEDURE:  Procedure(s): DILATATION AND EVACUATION (N/A)  SURGEON:  Surgeon(s) and Role:    * Olivia Mackie, MD - Primary   ASSISTANTS: none   ANESTHESIA:   local and general  EBL:  minimal  BLOOD ADMINISTERED:none  DRAINS: none   LOCAL MEDICATIONS USED:  MARCAINE    and Amount: 20 ml  SPECIMEN:  Source of Specimen:  POC  DISPOSITION OF SPECIMEN:  PATHOLOGY  COUNTS:  YES  DICTATION: .Other Dictation: Dictation Number [id number]  PLAN OF CARE: Discharge to home after PACU  PATIENT DISPOSITION:  PACU - hemodynamically stable.   Delay start of Pharmacological VTE agent (>24hrs) due to surgical blood loss or risk of bleeding: not applicable

## 2023-05-02 NOTE — H&P (Signed)
Jacqueline [name] is an 32 y.o. female. MAB for D&E  Pertinent Gynecological History: Menses: flow is moderate Bleeding: intermenstrual bleeding Contraception: none DES exposure: denies Blood transfusions: none Sexually transmitted diseases: no past history Previous GYN Procedures: DNC  Last mammogram:  na  Date: na Last pap: normal Date: na OB History: G3, P2   Menstrual History: Menarche age: 60 No LMP recorded.    Past Medical History:  Diagnosis Date   Allergic rhinitis    Allergy    Endometriosis    History of asthma    as child   History of placenta previa    2019 and 2022   Ocular migraine    Thyroid nodule 2018   small    Past Surgical History:  Procedure Laterality Date   CESAREAN SECTION N/A 09/29/2018   Procedure: CESAREAN SECTION;  Surgeon: Olivia Mackie, MD;  [facility]: [facility];  Service: Obstetrics;  Laterality: N/A;   CESAREAN SECTION N/A 03/12/2021   Procedure: Repeat CESAREAN SECTION/Possible Total Abdominal Hysterectomy;  Surgeon: Olivia Mackie, MD;  [facility]: [facility];  Service: Obstetrics;  Laterality: N/A;  EDD: 04/10/21 Requests 2 hrs.   CHROMOPERTUBATION Bilateral 03/20/2017   Procedure: CHROMOPERTUBATION;  Surgeon: Jerene Bears, MD;  Location: [facility];  Service: Gynecology;  Laterality: Bilateral;  Fallopian tubes   CYSTOSCOPY N/A 03/20/2017   Procedure: CYSTOSCOPY;  Surgeon: Jerene Bears, MD;  Location: [facility];  Service: Gynecology;  Laterality: N/A;   FOOT SURGERY Left    neuromona   LAPAROSCOPIC OVARIAN CYSTECTOMY Bilateral 03/20/2017   Procedure: LAPAROSCOPIC OVARIAN CYSTECTOMY;  Surgeon: Jerene Bears, MD;  Location: [facility];  Service: Gynecology;  Laterality: Bilateral;   LAPAROSCOPIC UNILATERAL SALPINGECTOMY Right 03/20/2017   Procedure: LAPAROSCOPIC UNILATERAL SALPINGECTOMY;  Surgeon: Jerene Bears, MD;  Location: [facility];  Service: Gynecology;  Laterality: Right;   LAPAROSCOPY Bilateral 03/20/2017   Procedure: LAPAROSCOPY  OPERATIVE  WITH LYSIS OF ADHESIONS;  Surgeon: Jerene Bears, MD;  Location: [facility];  Service: Gynecology;  Laterality: Bilateral;   WISDOM TOOTH EXTRACTION  03/28/2013    Family History  Problem Relation Age of Onset   Breast cancer Mother 1       mastectomy, negative genetic testing   Thyroid disease Neg Hx     Social History:  reports that she has never smoked. She has never used smokeless tobacco. She reports that she does not currently use alcohol. She reports that she does not use drugs.  Allergies:  Allergies  Allergen Reactions   Shellfish Allergy Anaphylaxis   Ciprofloxacin     Caused QT prolongation   Morphine     Makes my head burn on the inside", does not want morphine   Wound Dressing Adhesive Other (See Comments)    Had a cellulitis reaction.  Unsure if it was drape or dressing related    No medications prior to admission.    Review of Systems  Constitutional: Negative.   All other systems reviewed and are negative.   Height 5\' 2"  (1.575 m), weight 57.6 kg, unknown if currently breastfeeding. Physical Exam Vitals reviewed.  Constitutional:      Appearance: Normal appearance. She is normal weight.  HENT:     Head: Normocephalic and atraumatic.  Cardiovascular:     Rate and Rhythm: Normal rate and regular rhythm.     Pulses: Normal pulses.     Heart sounds: Normal heart sounds.  Pulmonary:     Effort: Pulmonary effort is normal.  Breath sounds: Normal breath sounds.  Abdominal:     General: Bowel sounds are normal.     Palpations: Abdomen is soft.  Genitourinary:    General: Normal vulva.  Musculoskeletal:        General: Normal range of motion.     Cervical back: Normal range of motion and neck supple.  Skin:    General: Skin is warm.  Neurological:     General: No focal deficit present.     Mental Status: She is alert.  Psychiatric:        Mood and Affect: Mood normal.     No results found for this or any previous visit (from the past  24 hour(s)).  No results found.  Assessment/Plan: MAB at 7w Suction D&E Surgical risks discussed. Consent signed and witnessed,.  Andreea Arca J 05/02/2023, 8:17 AM

## 2023-05-02 NOTE — Transfer of Care (Signed)
Immediate Anesthesia Transfer of Care Note  Patient: Jacqueline [name]  Procedure(s) Performed: Procedure(s) (LRB): DILATATION AND EVACUATION (N/A)  Patient Location: PACU  Anesthesia Type: General  Level of Consciousness: awake, oriented, sedated and patient cooperative  Airway & Oxygen Therapy: Patient Spontanous Breathing and Patient connected to face mask oxygen  Post-op Assessment: Report given to PACU RN and Post -op Vital signs reviewed and stable  Post vital signs: Reviewed and stable  Complications: No apparent anesthesia complications  Last Vitals:  Vitals Value Taken Time  BP 94/58 05/02/23 1357  Temp    Pulse 60 05/02/23 1359  Resp 13 05/02/23 1359  SpO2 100 % 05/02/23 1359  Vitals shown include unvalidated device data.  Last Pain:  Vitals:   05/02/23 1137  TempSrc: Oral         Complications: No notable events documented.

## 2023-05-02 NOTE — Op Note (Unsigned)
NAMEMARVELYN, [name] MEDICAL RECORD NO: [id number] ACCOUNT NO: [id number] DATE OF BIRTH: [birth year] FACILITY: [facility] LOCATION: [facility] PHYSICIAN: Lenoard Aden, MD  Operative Report   DATE OF PROCEDURE: 05/02/2023  PREOPERATIVE DIAGNOSIS:  Missed AB at 8 weeks with a 6 week fetal pole and no fetal heartbeat on ultrasound.  POSTOPERATIVE DIAGNOSIS:  Missed AB at 8 weeks with a 6 week fetal pole and no fetal heartbeat on ultrasound.  PROCEDURE:  Suction, dilatation and evacuation.  SURGEON:  Lenoard Aden, MD.  ASSISTANT:  None.  ANESTHESIA:  Local, general.  ESTIMATED BLOOD LOSS:  Minimal.  COMPLICATIONS:  None.  DRAINS:  None.  COUNTS:  Correct.  DISPOSITION:  The patient recovery in good condition.  BRIEF OPERATIVE NOTE:  After being apprised of the risks of anesthesia, infection, bleeding, and surrounding organs, possible need for repair, delayed versus immediate complications, to include bowel and bladder injury, possible need for repair.  The  patient was taken to the operating room where she was administered IV sedation without difficulty, prepped and draped in usual sterile fashion, catheterized and bladder was empty.  Exam under anesthesia revealed a mid positioned deviated to the left,  enlarged 6-8 week size uterus and no adnexal masses appreciated.  Dilute Marcaine solution placed.  Standard paracervical block, 20 mL total.  Cervix easily dilated up to 21 Pratt dilator.  A 7 mm curved suction curette placed.  Aspiration of products of  conception without difficulty, x4 passes was noted.  Blunt curettage in a 4 quadrant method confirms a cavity to be empty.  Good hemostasis noted.  All instruments were removed.  The patient tolerated the procedure well, transferred to recovery in good  condition.   PUS D: 05/02/2023 1:51:32 pm T: 05/02/2023 2:30:00 pm  JOB: [id number]/ [id number]

## 2023-05-02 NOTE — Anesthesia Procedure Notes (Signed)
Procedure Name: LMA Insertion Date/Time: 05/02/2023 1:34 PM  Performed by: Francie Massing, CRNAPre-anesthesia Checklist: Patient identified, Emergency Drugs available, Suction available and Patient being monitored Patient Re-evaluated:Patient Re-evaluated prior to induction Oxygen Delivery Method: Circle system utilized Preoxygenation: Pre-oxygenation with 100% oxygen Induction Type: IV induction Ventilation: Mask ventilation without difficulty LMA: LMA inserted LMA Size: 4.0 Number of attempts: 1 Airway Equipment and Method: Bite block Placement Confirmation: positive ETCO2 Tube secured with: Tape Dental Injury: Teeth and Oropharynx as per pre-operative assessment

## 2023-05-02 NOTE — Discharge Instructions (Addendum)
   D & C Home care Instructions:   Personal hygiene:  Used sanitary napkins for vaginal drainage not tampons. Shower or tub bathe the day after your procedure. No douching until bleeding stops. Always wipe from front to back after  Elimination.  Activity: Do not drive or operate any equipment today. The effects of the anesthesia are still present and drowsiness may result. Rest today, not necessarily flat bed rest, just take it easy. You may resume your normal activity in one to 2 days.  Sexual activity: No intercourse for one week or as indicated by your physician  Diet: Eat a light diet as desired this evening. You may resume a regular diet tomorrow.  Return to work: One to 2 days.  General Expectations of your surgery: Vaginal bleeding should be no heavier than a normal period. Spotting may continue up to 10 days. Mild cramps may continue for a couple of days. You may have a regular period in 2-6 weeks.  Unexpected observations call your doctor if these occur: persistent or heavy bleeding. Severe abdominal cramping or pain. Elevation of temperature greater than 100F.  Call for an appointment in one week.    Patient's Signature_______________________________________________________  Nurse's Signature________________________________________________________ Post Anesthesia Home Care Instructions  Activity: Get plenty of rest for the remainder of the day. A responsible individual must stay with you for 24 hours following the procedure.  For the next 24 hours, DO NOT: -Drive a car -Advertising copywriter -Drink alcoholic beverages -Take any medication unless instructed by your physician -Make any legal decisions or sign important papers.  Meals: Start with liquid foods such as gelatin or soup. Progress to regular foods as tolerated. Avoid greasy, spicy, heavy foods. If nausea and/or vomiting occur, drink only clear liquids until the nausea and/or vomiting subsides. Call your physician  if vomiting continues.  Special Instructions/Symptoms: Your throat may feel dry or sore from the anesthesia or the breathing tube placed in your throat during surgery. If this causes discomfort, gargle with warm salt water. The discomfort should disappear within 24 hours.  If you had a scopolamine patch placed behind your ear for the management of post- operative nausea and/or vomiting:  1. The medication in the patch is effective for 72 hours, after which it should be removed.  Wrap patch in a tissue and discard in the trash. Wash hands thoroughly with soap and water. 2. You may remove the patch earlier than 72 hours if you experience unpleasant side effects which may include dry mouth, dizziness or visual disturbances. 3. Avoid touching the patch. Wash your hands with soap and water after contact with the patch.  No acetaminophen/Tylenol until after 5:50 pm today if needed. No ibuprofen, Advil, Aleve, Motrin, ketorolac, meloxicam, naproxen, or other NSAIDS until after 7:50 pm today if needed.

## 2023-05-02 NOTE — Anesthesia Preprocedure Evaluation (Addendum)
Anesthesia Evaluation  Patient identified by MRN, date of birth, ID band Patient awake    Reviewed: Allergy & Precautions, NPO status , Patient's Chart, lab work & pertinent test results  Airway Mallampati: III  TM Distance: >3 FB Neck ROM: Full    Dental no notable dental hx.    Pulmonary neg pulmonary ROS   Pulmonary exam normal        Cardiovascular negative cardio ROS Normal cardiovascular exam     Neuro/Psych  Headaches  negative psych ROS   GI/Hepatic negative GI ROS, Neg liver ROS,,,  Endo/Other  negative endocrine ROS    Renal/GU negative Renal ROS     Musculoskeletal negative musculoskeletal ROS (+)    Abdominal   Peds  Hematology negative hematology ROS (+)   Anesthesia Other Findings Missed Abortion  Reproductive/Obstetrics                             Anesthesia Physical Anesthesia Plan  ASA: 2  Anesthesia Plan: MAC   Post-op Pain Management:    Induction: Intravenous  PONV Risk Score and Plan: 2 and Ondansetron, Dexamethasone, Propofol infusion and Midazolam  Airway Management Planned: Simple Face Mask  Additional Equipment:   Intra-op Plan:   Post-operative Plan:   Informed Consent: I have reviewed the patients History and Physical, chart, labs and discussed the procedure including the risks, benefits and alternatives for the proposed anesthesia with the patient or authorized representative who has indicated his/her understanding and acceptance.     Dental advisory given  Plan Discussed with: CRNA  Anesthesia Plan Comments:         Anesthesia Quick Evaluation

## 2023-05-02 NOTE — Anesthesia Postprocedure Evaluation (Signed)
Anesthesia Post Note  Patient: Jacqueline [name]  Procedure(s) Performed: DILATATION AND EVACUATION (Uterus)     Patient location during evaluation: PACU Anesthesia Type: General Level of consciousness: awake Pain management: pain level controlled Vital Signs Assessment: post-procedure vital signs reviewed and stable Respiratory status: spontaneous breathing, nonlabored ventilation and respiratory function stable Cardiovascular status: blood pressure returned to baseline and stable Postop Assessment: no apparent nausea or vomiting Anesthetic complications: no   No notable events documented.  Last Vitals:  Vitals:   05/02/23 1430 05/02/23 1512  BP: 107/74 104/65  Pulse: (!) 56 77  Resp: 14 16  Temp: 36.6 C   SpO2: 100% 100%    Last Pain:  Vitals:   05/02/23 1512  TempSrc:   PainSc: 1                  Dhaval Woo P Latissa Frick

## 2023-05-03 LAB — SURGICAL PATHOLOGY

## 2023-05-04 ENCOUNTER — Encounter (HOSPITAL_BASED_OUTPATIENT_CLINIC_OR_DEPARTMENT_OTHER): Payer: Self-pay | Admitting: Obstetrics and Gynecology

## 2024-01-07 LAB — OB RESULTS CONSOLE GC/CHLAMYDIA
Chlamydia: NEGATIVE
Neisseria Gonorrhea: NEGATIVE

## 2024-01-07 LAB — OB RESULTS CONSOLE ANTIBODY SCREEN: Antibody Screen: NEGATIVE

## 2024-01-07 LAB — OB RESULTS CONSOLE HIV ANTIBODY (ROUTINE TESTING): HIV: NONREACTIVE

## 2024-01-07 LAB — OB RESULTS CONSOLE RPR: RPR: NONREACTIVE

## 2024-01-07 LAB — OB RESULTS CONSOLE HEPATITIS B SURFACE ANTIGEN: Hepatitis B Surface Ag: NEGATIVE

## 2024-01-07 LAB — OB RESULTS CONSOLE RUBELLA ANTIBODY, IGM: Rubella: IMMUNE

## 2024-01-07 NOTE — Progress Notes (Signed)
 Patient Name:  Jacqueline [name] Date Of Birth:  06-Dec-1990 Medical Record Number:  [id number] Date:  01/07/2024 Diagnosis: Acute non-recurrent frontal sinusitis [J01.10] Consulting Physician: Guillermina Matar, DO Primary Physician: Vindya Jeyaretnam, DO Referring Physician: No ref. provider found   Patient Location: home  Provider Location: Home Office  Jacqueline [name] was evaluated today virtually via interactive real-time video encounter. A caregiver was present when appropriate. this Virtual Visit was conducted with patient's (and/or legal guardian's) consent, to reduce the patient's risk of exposure and provide necessary medical care.  CHIEF COMPLAINT   Sinus Problem   ASSESSMENT AND PLAN   Assessment/Plan  Jacqueline [name] was seen today for sinus problem. Diagnoses and all orders for this visit: 1. Acute non-recurrent frontal sinusitis (Primary) 2. [redacted] weeks gestation of pregnancy Other orders -     amoxicillin  (AMOXIL ) 500 mg tablet; Take 1 tablet (500 mg total) by mouth in the morning and 1 tablet (500 mg total) before bedtime. Do all this for 10 days.  Dispense: 20 tablet; Refill: 0  Symptoms have persistent beyond 2 weeks and are most consistent with acute uncomplicated bacterial sinusitis. No indication for further testing or imaging at this time. Antibiotic started per visit orders.  Recommend supportive care, rest, good hand hygiene, encourage normal intake of oral fluids. Acetaminophen  as needed for fever and pain. May augment with appropriate doses of NSAID as desired.  Recommend liberal use of nasal saline. Patient is to return to the clinic or Emergency Room if symptoms worsen or change significantly. The patient indicates understanding of these issues and agrees with the plan.   HISTORY OF PRESENT ILLNESS   Jacqueline [name] is a 33 y.o. female.    Subjective   33 year old female patient presents for sick visit: Patient recently had covid and since then has had  phelgm. She states last week she started to notice tooth pain on the right molar. She states the next day she had pain on the gum, right side of her nose felt pressure and she had a bad taste in her mouth. She states she had some left over amoxicillin  and she did start that and is feeling better.  She is currently [redacted] weeks pregnant.        LABS / IMAGES / DIAGNOSTICS All have been personally reviewed and are significant for Not Applicable  CURRENT MEDICATIONS:   Current Outpatient Medications:  .  loratadine (Claritin) 10 mg tablet, Take 1 tablet (10 mg total) by mouth daily in the morning., Disp: , Rfl:  .  progesterone  (PROMETRIUM ) 100 mg capsule, Take 1 capsule (100 mg total) by mouth daily in the morning., Disp: , Rfl:  .  amoxicillin  (AMOXIL ) 500 mg tablet, Take 1 tablet (500 mg total) by mouth in the morning and 1 tablet (500 mg total) before bedtime. Do all this for 10 days., Disp: 20 tablet, Rfl: 0 ALLERGIES:  Allergies  Allergen Reactions  . Amoxicillin -Pot Clavulanate Other (see comments)    childhood Does not remember   . Ciprofloxacin      Caused QT prolongation  . Shellfish Derived   . Clavulanic Acid Rash    Had rash with Augmentin  as a child but tolerates amoxicillin ;    PAST MEDICAL HISTORY:   Past Medical History:  Diagnosis Date  . Endometriosis    PAST SURGICAL HISTORY:   Past Surgical History:  Procedure Laterality Date  . CESAREAN SECTION, CLASSIC    . DENTAL SURGERY     Wisdom Teeth Removal   .  FOOT NEUROMA SURGERY Left   . PELVIC LAPAROSCOPY     Endometriosis    FAMILY HISTORY:   Family History  Problem Relation Age of Onset  . Breast cancer Mother   . Depression Father   . Hypertension Father   . No Known Problems Brother   . Arthritis Maternal Grandmother   . Hearing loss Maternal Grandmother   . Heart disease Maternal Grandmother   . Hypertension Maternal Grandmother   . Diabetes Maternal Grandmother    SOCIAL HISTORY:   Social History    Socioeconomic History  . Marital status: Married    Spouse name: Not on file  . Number of children: Not on file  . Years of education: Not on file  . Highest education level: Not on file  Occupational History  . Not on file  Tobacco Use  . Smoking status: Never    Passive exposure: Never  . Smokeless tobacco: Never  Substance and Sexual Activity  . Alcohol use: Yes    Comment: Socially   . Drug use: Never  . Sexual activity: Not on file    Comment: married  Other Topics Concern  . Not on file  Social History Narrative  . Not on file   Social Drivers of Health   Financial Resource Strain: Not on file  Food Insecurity: No Food Insecurity (01/19/2021)   Received from [facility] [place]   Hunger Vital Sign   . Worried About Programme researcher, broadcasting/film/video in the Last Year: Never true   . Ran Out of Food in the Last Year: Never true  Transportation Needs: No Transportation Needs (01/19/2021)   Received from [facility] [place]   [facility] - Transportation   . Lack of Transportation (Medical): No   . Lack of Transportation (Non-Medical): No  Physical Activity: Not on file  Stress: Not on file  Social Connections: Not on file  Intimate Partner Violence: Not on file  Housing Stability: Not on file      REVIEW OF SYSTEMS   Review of Systems  Constitutional:  Negative for activity change, appetite change, fever and unexpected weight change.  HENT:  Positive for congestion, rhinorrhea, sinus pressure and sinus pain. Negative for hearing loss and sore throat.   Eyes:  Negative for discharge.  Respiratory:  Negative for cough, chest tightness and shortness of breath.   Cardiovascular:  Negative for chest pain and leg swelling.  Gastrointestinal:  Negative for abdominal distention, abdominal pain, constipation, diarrhea, nausea and vomiting.  Endocrine: Negative for polydipsia, polyphagia and polyuria.  Genitourinary:  Negative for dysuria, pelvic pain, urgency and vaginal  discharge.  Musculoskeletal:  Negative for arthralgias.  Skin:  Negative for rash.  Neurological:  Negative for dizziness, light-headedness and headaches.  Psychiatric/Behavioral:  Negative for agitation and suicidal ideas.     Objective  There were no vitals filed for this visit.  There is no height or weight on file to calculate BMI.  The following sections of the medical record have been reviewed, and updated as appropriate, during this encounter:  Tobacco  Allergies  Meds  Problems  Med Hx  Surg Hx  Fam Hx      PHYSICAL EXAMINATION   Physical Exam Vitals and nursing note reviewed.  Neurological:     General: No focal deficit present.     Mental Status: She is oriented to person, place, and time. Mental status is at baseline.  Psychiatric:        Mood and Affect:  Mood normal.        Behavior: Behavior normal.        Thought Content: Thought content normal.      FOLLOW UP   Return if symptoms worsen or fail to improve.  Encounter time

## 2024-03-28 ENCOUNTER — Other Ambulatory Visit: Payer: Self-pay | Admitting: Obstetrics & Gynecology

## 2024-03-28 DIAGNOSIS — O4402 Placenta previa specified as without hemorrhage, second trimester: Secondary | ICD-10-CM

## 2024-04-28 DIAGNOSIS — Z8759 Personal history of other complications of pregnancy, childbirth and the puerperium: Secondary | ICD-10-CM | POA: Insufficient documentation

## 2024-04-28 DIAGNOSIS — O09299 Supervision of pregnancy with other poor reproductive or obstetric history, unspecified trimester: Secondary | ICD-10-CM | POA: Insufficient documentation

## 2024-04-28 DIAGNOSIS — O34219 Maternal care for unspecified type scar from previous cesarean delivery: Secondary | ICD-10-CM | POA: Insufficient documentation

## 2024-04-28 DIAGNOSIS — O4402 Placenta previa specified as without hemorrhage, second trimester: Secondary | ICD-10-CM | POA: Insufficient documentation

## 2024-05-12 ENCOUNTER — Other Ambulatory Visit: Payer: Self-pay | Admitting: Obstetrics & Gynecology

## 2024-05-12 ENCOUNTER — Ambulatory Visit (HOSPITAL_BASED_OUTPATIENT_CLINIC_OR_DEPARTMENT_OTHER): Admitting: Obstetrics and Gynecology

## 2024-05-12 ENCOUNTER — Ambulatory Visit: Attending: Obstetrics & Gynecology

## 2024-05-12 DIAGNOSIS — Z8759 Personal history of other complications of pregnancy, childbirth and the puerperium: Secondary | ICD-10-CM

## 2024-05-12 DIAGNOSIS — Z3A29 29 weeks gestation of pregnancy: Secondary | ICD-10-CM

## 2024-05-12 DIAGNOSIS — O09813 Supervision of pregnancy resulting from assisted reproductive technology, third trimester: Secondary | ICD-10-CM

## 2024-05-12 DIAGNOSIS — Z8632 Personal history of gestational diabetes: Secondary | ICD-10-CM | POA: Diagnosis not present

## 2024-05-12 DIAGNOSIS — O4402 Placenta previa specified as without hemorrhage, second trimester: Secondary | ICD-10-CM

## 2024-05-12 DIAGNOSIS — O09299 Supervision of pregnancy with other poor reproductive or obstetric history, unspecified trimester: Secondary | ICD-10-CM

## 2024-05-12 DIAGNOSIS — O34219 Maternal care for unspecified type scar from previous cesarean delivery: Secondary | ICD-10-CM | POA: Diagnosis not present

## 2024-05-12 DIAGNOSIS — O09213 Supervision of pregnancy with history of pre-term labor, third trimester: Secondary | ICD-10-CM

## 2024-05-12 DIAGNOSIS — O4403 Placenta previa specified as without hemorrhage, third trimester: Secondary | ICD-10-CM

## 2024-05-12 DIAGNOSIS — O09293 Supervision of pregnancy with other poor reproductive or obstetric history, third trimester: Secondary | ICD-10-CM

## 2024-05-12 NOTE — Progress Notes (Signed)
 Maternal-Fetal Medicine Consultation Name: Jacqueline [name] MRN: [id number]  G5 P2022 at 29w 1d gestation.  Patient is here for a second opinion.  At your office ultrasound placenta previa was diagnosed. Obstetric history is significant for 2 cesarean deliveries.  She had placenta previa and vaginal bleeding both her pregnancies. IVF pregnancy with embryo transfer.  Aneuploidy screening is reportedly negative (preimplantation genetic testing). Patient does not give history of vaginal bleeding in this pregnancy.  Ultrasound We performed fetal anatomical survey.  Amniotic fluid is normal good fetal activity seen.  Fetal growth is appropriate for gestational age.  On transabdominal scan, placenta previa is confirmed.  Fetal anatomy appears normal but limited by advanced gestational age.  Because of a history of 2 previous cesarean delivery and the finding of placenta previa, we performed a transvaginal ultrasound with partially full bladder to rule out placenta accreta spectrum. -Placenta appears normal with no evidence of lacunae. No bulging is seen. -Myometrial-bladder interface appears normal with no bridging vessels. -Some vessels seen on the bladder mucosa did not represent invasion. -Cervix was difficult to measure but no placental invasion is seen.  Previous cesarean deliveries with placenta previa I counseled the patient with the help of ultrasound images.  Findings are not consistent with placental accreta spectrum. Ultrasound has limitations in accurately diagnosing placenta accreta spectrum (PAS).  MRI is unlikely to add further information and may lead to false positive diagnosis. I informed the patient that PAS an evolving condition and follow-up ultrasound in 4 weeks is recommended. Although there is no clear evidence of PAS on ultrasound, placenta accreta spectrum may be evident only at surgery in some cases.  If detected, hysterectomy will be performed.  Recommendations - An  appointment was made for her to return in 4 weeks for fetal growth assessment and transvaginal ultrasound with partially full bladder. -Weekly antenatal testing from 36 weeks till delivery. - Delivery at [redacted] weeks gestation.  Consultation including face-to-face (more than 50%) counseling 30 minutes.

## 2024-05-13 ENCOUNTER — Other Ambulatory Visit: Payer: Self-pay | Admitting: *Deleted

## 2024-05-13 DIAGNOSIS — O4403 Placenta previa specified as without hemorrhage, third trimester: Secondary | ICD-10-CM

## 2024-05-21 ENCOUNTER — Other Ambulatory Visit: Payer: Self-pay | Admitting: Obstetrics & Gynecology

## 2024-06-11 ENCOUNTER — Ambulatory Visit

## 2024-06-12 ENCOUNTER — Ambulatory Visit

## 2024-06-16 ENCOUNTER — Encounter (HOSPITAL_COMMUNITY): Payer: Self-pay

## 2024-06-16 NOTE — Patient Instructions (Signed)
 Jacqueline [name]  06/16/2024   Your procedure is scheduled on:  06/28/2024  Arrive at 0530 at Entrance C on [address] at [facility]  and [employer]. You are invited to use the FREE valet parking or use the Visitor's parking deck.  Pick up the phone at the desk and dial [phone].  Call this number if you have problems the morning of surgery: [phone]  Remember:   Do not eat food:(After Midnight) Desps de medianoche.  You may drink clear liquids until  __0330___.  Clear liquids means a liquid you can see thru.  It can have color such as Cola or Kool aid.  Tea is OK and coffee as long as no milk or creamer of any kind.  Take these medicines the morning of surgery with A SIP OF WATER :  none   Do not wear jewelry, make-up or nail polish.  Do not wear lotions, powders, or perfumes. Do not wear deodorant.  Do not shave 48 hours prior to surgery.  Do not bring valuables to the hospital.  [facility] is not   responsible for any belongings or valuables brought to the hospital.  Contacts, dentures or bridgework may not be worn into surgery.  Leave suitcase in the car. After surgery it may be brought to your room.  For patients admitted to the hospital, checkout time is 11:00 AM the day of              discharge.      Please read over the following fact sheets that you were given:     Preparing for Surgery

## 2024-06-26 ENCOUNTER — Encounter (HOSPITAL_COMMUNITY): Admission: RE | Admit: 2024-06-26 | Source: Ambulatory Visit

## 2024-06-26 ENCOUNTER — Encounter (HOSPITAL_COMMUNITY)
Admission: RE | Admit: 2024-06-26 | Discharge: 2024-06-26 | Disposition: A | Discharge status: Home / Self Care | Source: Ambulatory Visit | Attending: Obstetrics & Gynecology | Admitting: Obstetrics & Gynecology

## 2024-06-27 ENCOUNTER — Encounter (HOSPITAL_COMMUNITY)
Admission: RE | Admit: 2024-06-27 | Discharge: 2024-06-27 | Disposition: A | Discharge status: Home / Self Care | Source: Ambulatory Visit | Attending: Obstetrics & Gynecology | Admitting: Obstetrics & Gynecology

## 2024-06-27 DIAGNOSIS — Z3A35 35 weeks gestation of pregnancy: Secondary | ICD-10-CM | POA: Insufficient documentation

## 2024-06-27 DIAGNOSIS — O34219 Maternal care for unspecified type scar from previous cesarean delivery: Secondary | ICD-10-CM | POA: Insufficient documentation

## 2024-06-27 LAB — CBC
HCT: 36.1 % (ref 36.0–46.0)
Hemoglobin: 11.9 g/dL — ABNORMAL LOW (ref 12.0–15.0)
MCH: 33.3 pg (ref 26.0–34.0)
MCHC: 33 g/dL (ref 30.0–36.0)
MCV: 101.1 fL — ABNORMAL HIGH (ref 80.0–100.0)
Platelets: 268 K/uL (ref 150–400)
RBC: 3.57 MIL/uL — ABNORMAL LOW (ref 3.87–5.11)
RDW: 14.3 % (ref 11.5–15.5)
WBC: 11.4 K/uL — ABNORMAL HIGH (ref 4.0–10.5)
nRBC: 0 % (ref 0.0–0.2)

## 2024-06-27 LAB — PREPARE RBC (CROSSMATCH)

## 2024-06-27 NOTE — H&P (Signed)
 Jacqueline [name] is a 33 y.o. female presenting for repeat 3rd C/section at 34 wks for placenta previa and h/o C/s x 2.    33 yo MF [id number], IVF, nl PGD, EDC 07/27/24.  2 kids- both IVF and C/sections for Previa- 2019 (32 wks for bleeding previa), 2022- (35.6 wks for bleeding previa).  H/o GDM A1, not this time. 2 spont pregnancies w/ SABs after 2 kids and this is successful IVF again. Seen by MFM at 29.5 wks there is not evidence of accreta. Pt declined further MFM f/up due to cost. Last office sono 33.4 wks transverse, back down, complete previa, AFI 20, 5'12 at 86% .  OB History     Gravida  34   Para  2   Term  0   Preterm  2   AB  2   Living  2      SAB  2   IAB  0   Ectopic  0   Multiple  0   Live Births  2          Past Medical History:  Diagnosis Date   Allergic rhinitis    Allergy    Endometriosis    Family history of breast cancer 09/22/2021   History of asthma    as child   History of placenta previa    2019 and 2022   Leukocytosis 03/28/2017   Multinodular goiter 05/29/2016   Ocular migraine    Personal history of COVID-19 04/06/2020   Last Assessment & Plan:   Formatting of this note might be different from the original.  Pleasant 33 year old female who is afebrile, nontoxic and in no acute distress.  She is 33 past her recommended quarantine time per CDC guidelines.  She is asymptomatic for COVID-19.  She has had no additional exposures.  She is considered fully recovered and is cleared for travel.     Prolonged Q-T interval on ECG 03/28/2017   Thrombocytosis 03/28/2017   Thyroid  nodule 2018   small   Past Surgical History:  Procedure Laterality Date   CESAREAN SECTION N/A 09/29/2018   Procedure: CESAREAN SECTION;  Surgeon: Gorge Ade, MD;  Location: [facility]IR[facility] SU[facility];  Service: Obstetrics;  Laterality: N/A;   CESAREAN SECTION N/A 03/12/2021   Procedure: Repeat CESAREAN SECTION/Possible Total Abdominal Hysterectomy;  Surgeon: Gorge Ade, MD;  Location: [facility] LD ORS;  Service: Obstetrics;  Laterality: N/A;  EDD: 04/10/21 Requests 2 hrs.   CHROMOPERTUBATION Bilateral 03/20/2017   Procedure: CHROMOPERTUBATION;  Surgeon: Cleotilde Ronal RAMAN, MD;  Location: [facility] ORS;  Service: Gynecology;  Laterality: Bilateral;  Fallopian tubes   CYSTOSCOPY N/A 03/20/2017   Procedure: CYSTOSCOPY;  Surgeon: Cleotilde Ronal RAMAN, MD;  Location: [facility] ORS;  Service: Gynecology;  Laterality: N/A;   DILATION AND EVACUATION N/A 05/02/2023   Procedure: DILATATION AND EVACUATION;  Surgeon: Gorge Ade, MD;  Location: [facility] [address];  Service: Gynecology;  Laterality: N/A;   FOOT SURGERY Left    neuromona   LAPAROSCOPIC OVARIAN CYSTECTOMY Bilateral 03/20/2017   Procedure: LAPAROSCOPIC OVARIAN CYSTECTOMY;  Surgeon: Cleotilde Ronal RAMAN, MD;  Location: [facility] ORS;  Service: Gynecology;  Laterality: Bilateral;   LAPAROSCOPIC UNILATERAL SALPINGECTOMY Right 03/20/2017   Procedure: LAPAROSCOPIC UNILATERAL SALPINGECTOMY;  Surgeon: Cleotilde Ronal RAMAN, MD;  Location: [facility] ORS;  Service: Gynecology;  Laterality: Right;   LAPAROSCOPY Bilateral 03/20/2017   Procedure: LAPAROSCOPY OPERATIVE  WITH LYSIS OF ADHESIONS;  Surgeon: Cleotilde Ronal RAMAN, MD;  Location: [facility] ORS;  Service: Gynecology;  Laterality: Bilateral;  WISDOM TOOTH EXTRACTION  03/28/2013   Family History: family history includes Breast cancer (age of onset: 64) in her mother. Social History:  reports that she has never smoked. She has never used smokeless tobacco. She reports that she does not currently use alcohol. She reports that she does not use drugs.     Maternal Diabetes: No Genetic Screening: Normal Maternal Ultrasounds/Referrals: Persistent complete previa  Fetal Ultrasounds or other Referrals:  Referred to Materal Fetal Medicine  no accreta noted  Maternal Substance Abuse:  No Significant Maternal Medications:  None Significant Maternal Lab Results:  Group B Strep negative Number of Prenatal Visits:greater  than 3 verified prenatal visits Maternal Vaccinations:none Other Comments:  None  Review of Systems History   Last menstrual period 11/04/2023, unknown if currently breastfeeding. Exam Physical Exam  BP 105/74 (BP Location: Right Arm)   Pulse 94   Temp 98.1 F (36.7 C) (Oral)   Resp 20   Ht 5' 2 (1.575 m)   Wt 74.4 kg   LMP 11/04/2023   SpO2 100%   BMI 30.00 kg/m  Physical exam:  A&O x 3, no acute distress. Pleasant HEENT neg, no thyromegaly Lungs CTA bilat CV RRR, S1S2 normal Abdo soft, non tender, non acute Extr no edema/ tenderness Pelvic def FHT  130s Toco none  Prenatal labs: ABO, Rh: --/--/A POS (08/22 1549) Antibody: POS (08/22 1549) Rubella: Immune (03/03 0000) RPR: Nonreactive (03/03 0000)  HBsAg: Negative (03/03 0000)  Hep C neg  HIV: Non-reactive (03/03 0000)  GBS:    negative   Assessment/Plan:  33 yo G5P2022.  35.6 wks, with complete previa with h/o C/s x2. Here for Repeat C/s 35.6 wks..Transverse lie at last sono. POssible classical C/s needed. defer BTMZ. No evidence of accreta by office or MFM sono.. Risk/ complications possible hysterectomy / transufsion dw pt  Accepts   Risks/complications of surgery reviewed incl infection, bleeding, damage to internal organs including bladder, bowels, ureters, blood vessels, other risks from anesthesia, VTE and delayed complications of any surgery, complications in future surgery reviewed. Also discussed neonatal complications incl difficult delivery, laceration, vacuum assistance, TTN etc. Pt understands and agrees, all concerns addressed.      [name] 06/27/2024, 9:22 PM

## 2024-06-27 NOTE — Patient Instructions (Signed)
 Jacqueline [name]  06/27/2024   Your procedure is scheduled on:  06/28/2024  Arrive at 0530 at Entrance C on [address] at [facility]  and [employer]. You are invited to use the FREE valet parking or use the Visitor's parking deck.  Pick up the phone at the desk and dial [phone].  Call this number if you have problems the morning of surgery: [phone]  Remember:   Do not eat food:(After Midnight) Desps de medianoche.  You may drink clear liquids until  __0330___.  Clear liquids means a liquid you can see thru.  It can have color such as Cola or Kool aid.  Tea is OK and coffee as long as no milk or creamer of any kind.  Take these medicines the morning of surgery with A SIP OF WATER :  none   Do not wear jewelry, make-up or nail polish.  Do not wear lotions, powders, or perfumes. Do not wear deodorant.  Do not shave 48 hours prior to surgery.  Do not bring valuables to the hospital.  [facility] is not   responsible for any belongings or valuables brought to the hospital.  Contacts, dentures or bridgework may not be worn into surgery.  Leave suitcase in the car. After surgery it may be brought to your room.  For patients admitted to the hospital, checkout time is 11:00 AM the day of              discharge.      Please read over the following fact sheets that you were given:     Preparing for Surgery

## 2024-06-28 ENCOUNTER — Other Ambulatory Visit: Payer: Self-pay

## 2024-06-28 ENCOUNTER — Encounter (HOSPITAL_COMMUNITY): Payer: Self-pay | Admitting: Obstetrics & Gynecology

## 2024-06-28 ENCOUNTER — Inpatient Hospital Stay (HOSPITAL_COMMUNITY): Payer: Self-pay | Admitting: Anesthesiology

## 2024-06-28 ENCOUNTER — Inpatient Hospital Stay (HOSPITAL_COMMUNITY)
Admission: RE | Admit: 2024-06-28 | Discharge: 2024-07-01 | DRG: 784 | Disposition: A | Discharge status: Home / Self Care | Payer: Self-pay | Attending: Obstetrics & Gynecology | Admitting: Obstetrics & Gynecology

## 2024-06-28 ENCOUNTER — Encounter (HOSPITAL_COMMUNITY)
Admission: RE | Disposition: A | Discharge status: Home / Self Care | Payer: Self-pay | Source: Home / Self Care | Attending: Obstetrics & Gynecology

## 2024-06-28 DIAGNOSIS — Z3A35 35 weeks gestation of pregnancy: Secondary | ICD-10-CM | POA: Diagnosis not present

## 2024-06-28 DIAGNOSIS — O4403 Placenta previa specified as without hemorrhage, third trimester: Secondary | ICD-10-CM | POA: Diagnosis present

## 2024-06-28 DIAGNOSIS — O34211 Maternal care for low transverse scar from previous cesarean delivery: Secondary | ICD-10-CM | POA: Diagnosis present

## 2024-06-28 DIAGNOSIS — O9081 Anemia of the puerperium: Secondary | ICD-10-CM | POA: Diagnosis not present

## 2024-06-28 DIAGNOSIS — O09299 Supervision of pregnancy with other poor reproductive or obstetric history, unspecified trimester: Secondary | ICD-10-CM

## 2024-06-28 DIAGNOSIS — Z8616 Personal history of COVID-19: Secondary | ICD-10-CM | POA: Diagnosis not present

## 2024-06-28 DIAGNOSIS — Z302 Encounter for sterilization: Secondary | ICD-10-CM

## 2024-06-28 DIAGNOSIS — Z8632 Personal history of gestational diabetes: Secondary | ICD-10-CM | POA: Diagnosis not present

## 2024-06-28 DIAGNOSIS — D62 Acute posthemorrhagic anemia: Secondary | ICD-10-CM | POA: Diagnosis not present

## 2024-06-28 DIAGNOSIS — O322XX0 Maternal care for transverse and oblique lie, not applicable or unspecified: Secondary | ICD-10-CM | POA: Diagnosis present

## 2024-06-28 DIAGNOSIS — Z98891 History of uterine scar from previous surgery: Secondary | ICD-10-CM

## 2024-06-28 SURGERY — Surgical Case
Anesthesia: Spinal

## 2024-06-28 MED ORDER — CEFAZOLIN SODIUM-DEXTROSE 2-4 GM/100ML-% IV SOLN
INTRAVENOUS | Status: AC
Start: 2024-06-28 — End: 2024-06-28
  Filled 2024-06-28: qty 100

## 2024-06-28 MED ORDER — DIPHENHYDRAMINE HCL 50 MG/ML IJ SOLN: 12.5000 mg | INTRAMUSCULAR | Status: DC | PRN

## 2024-06-28 MED ORDER — PHENYLEPHRINE 80 MCG/ML (10ML) SYRINGE FOR IV PUSH (FOR BLOOD PRESSURE SUPPORT)
PREFILLED_SYRINGE | INTRAVENOUS | Status: AC
  Filled 2024-06-28: qty 10

## 2024-06-28 MED ORDER — NALOXONE HCL 0.4 MG/ML IJ SOLN
0.4000 mg | INTRAMUSCULAR | Status: DC | PRN
Start: 2024-06-28 — End: 2024-07-01

## 2024-06-28 MED ORDER — OXYTOCIN-SODIUM CHLORIDE 30-0.9 UT/500ML-% IV SOLN
2.5000 [IU]/h | INTRAVENOUS | Status: AC
  Administered 2024-06-28: 2.5 [IU]/h via INTRAVENOUS
  Filled 2024-06-28: qty 500

## 2024-06-28 MED ORDER — DIPHENHYDRAMINE HCL 25 MG PO CAPS: 25.0000 mg | ORAL_CAPSULE | ORAL | Status: DC | PRN

## 2024-06-28 MED ORDER — TRANEXAMIC ACID-NACL 1000-0.7 MG/100ML-% IV SOLN
INTRAVENOUS | Status: AC
  Filled 2024-06-28: qty 100

## 2024-06-28 MED ORDER — IBUPROFEN 600 MG PO TABS
600.0000 mg | ORAL_TABLET | Freq: Four times a day (QID) | ORAL | Status: DC
  Administered 2024-06-29 – 2024-06-30 (×5): 600 mg via ORAL
  Filled 2024-06-28 (×7): qty 1

## 2024-06-28 MED ORDER — ONDANSETRON HCL 4 MG/2ML IJ SOLN: INTRAMUSCULAR | Status: DC | PRN

## 2024-06-28 MED ORDER — DEXAMETHASONE SODIUM PHOSPHATE 10 MG/ML IJ SOLN
INTRAMUSCULAR | Status: AC
  Filled 2024-06-28: qty 1

## 2024-06-28 MED ORDER — ACETAMINOPHEN 325 MG PO TABS
650.0000 mg | ORAL_TABLET | ORAL | Status: DC | PRN
  Administered 2024-06-29 – 2024-07-01 (×4): 650 mg via ORAL
  Filled 2024-06-28 (×4): qty 2

## 2024-06-28 MED ORDER — EPHEDRINE SULFATE (PRESSORS) 50 MG/ML IJ SOLN
INTRAMUSCULAR | Status: DC | PRN
  Administered 2024-06-28 (×6): 10 mg via INTRAVENOUS

## 2024-06-28 MED ORDER — ONDANSETRON HCL 4 MG/2ML IJ SOLN
INTRAMUSCULAR | Status: DC | PRN
  Administered 2024-06-28: 4 mg via INTRAVENOUS

## 2024-06-28 MED ORDER — METOCLOPRAMIDE HCL 5 MG/ML IJ SOLN
INTRAMUSCULAR | Status: AC
  Filled 2024-06-28: qty 2

## 2024-06-28 MED ORDER — TRANEXAMIC ACID-NACL 1000-0.7 MG/100ML-% IV SOLN
1000.0000 mg | Freq: Once | INTRAVENOUS | Status: AC
  Administered 2024-06-28: 1000 mg via INTRAVENOUS

## 2024-06-28 MED ORDER — METHYLERGONOVINE MALEATE 0.2 MG/ML IJ SOLN
INTRAMUSCULAR | Status: DC | PRN
  Administered 2024-06-28: .2 mg via INTRAMUSCULAR

## 2024-06-28 MED ORDER — MENTHOL 3 MG MT LOZG: 1.0000 | LOZENGE | OROMUCOSAL | Status: DC | PRN

## 2024-06-28 MED ORDER — SODIUM CHLORIDE 0.9% FLUSH: 3.0000 mL | INTRAVENOUS | Status: DC | PRN

## 2024-06-28 MED ORDER — LORATADINE 10 MG PO TABS
10.0000 mg | ORAL_TABLET | Freq: Every evening | ORAL | Status: DC
  Administered 2024-06-28 – 2024-06-30 (×3): 10 mg via ORAL
  Filled 2024-06-28 (×3): qty 1

## 2024-06-28 MED ORDER — ACETAMINOPHEN 10 MG/ML IV SOLN
INTRAVENOUS | Status: DC | PRN
  Administered 2024-06-28: 1000 mg via INTRAVENOUS

## 2024-06-28 MED ORDER — POVIDONE-IODINE 10 % EX SWAB
2.0000 | Freq: Once | CUTANEOUS | Status: AC
  Administered 2024-06-28: 2 via TOPICAL

## 2024-06-28 MED ORDER — KETOROLAC TROMETHAMINE 30 MG/ML IJ SOLN: 30.0000 mg | Freq: Four times a day (QID) | INTRAMUSCULAR | Status: DC

## 2024-06-28 MED ORDER — FENTANYL CITRATE (PF) 100 MCG/2ML IJ SOLN: 25.0000 ug | INTRAMUSCULAR | Status: DC | PRN

## 2024-06-28 MED ORDER — COCONUT OIL OIL
1.0000 | TOPICAL_OIL | Status: DC | PRN
Start: 2024-06-28 — End: 2024-07-01
  Administered 2024-06-28 (×2): 1 via TOPICAL

## 2024-06-28 MED ORDER — ONDANSETRON HCL 4 MG/2ML IJ SOLN: 4.0000 mg | Freq: Three times a day (TID) | INTRAMUSCULAR | Status: DC | PRN

## 2024-06-28 MED ORDER — OXYCODONE HCL 5 MG PO TABS
5.0000 mg | ORAL_TABLET | ORAL | Status: DC | PRN
  Administered 2024-06-30: 5 mg via ORAL
  Filled 2024-06-28: qty 1

## 2024-06-28 MED ORDER — SENNOSIDES-DOCUSATE SODIUM 8.6-50 MG PO TABS
2.0000 | ORAL_TABLET | Freq: Every day | ORAL | Status: DC
  Administered 2024-06-29 – 2024-07-01 (×3): 2 via ORAL
  Filled 2024-06-28 (×3): qty 2

## 2024-06-28 MED ORDER — DIPHENHYDRAMINE HCL 25 MG PO CAPS: 25.0000 mg | ORAL_CAPSULE | Freq: Four times a day (QID) | ORAL | Status: DC | PRN

## 2024-06-28 MED ORDER — DEXAMETHASONE SODIUM PHOSPHATE 10 MG/ML IJ SOLN
INTRAMUSCULAR | Status: DC | PRN
Start: 2024-06-28 — End: 2024-06-28
  Administered 2024-06-28: 10 mg via INTRAVENOUS

## 2024-06-28 MED ORDER — KETOROLAC TROMETHAMINE 30 MG/ML IJ SOLN
30.0000 mg | Freq: Four times a day (QID) | INTRAMUSCULAR | Status: AC
  Administered 2024-06-28 – 2024-06-29 (×4): 30 mg via INTRAVENOUS
  Filled 2024-06-28 (×3): qty 1

## 2024-06-28 MED ORDER — ACETAMINOPHEN 10 MG/ML IV SOLN
INTRAVENOUS | Status: AC
Start: 2024-06-28 — End: 2024-06-28
  Filled 2024-06-28: qty 100

## 2024-06-28 MED ORDER — ONDANSETRON HCL 4 MG/2ML IJ SOLN
INTRAMUSCULAR | Status: AC
  Filled 2024-06-28: qty 2

## 2024-06-28 MED ORDER — MORPHINE SULFATE (PF) 0.5 MG/ML IJ SOLN
INTRAMUSCULAR | Status: DC | PRN
  Administered 2024-06-28: 150 ug via INTRATHECAL

## 2024-06-28 MED ORDER — LACTATED RINGERS IV SOLN: INTRAVENOUS | Status: DC

## 2024-06-28 MED ORDER — SIMETHICONE 80 MG PO CHEW
80.0000 mg | CHEWABLE_TABLET | ORAL | Status: DC | PRN
Start: 2024-06-28 — End: 2024-07-01

## 2024-06-28 MED ORDER — LACTATED RINGERS IV SOLN: INTRAVENOUS | Status: DC | PRN

## 2024-06-28 MED ORDER — FENTANYL CITRATE (PF) 100 MCG/2ML IJ SOLN
INTRAMUSCULAR | Status: AC
  Filled 2024-06-28: qty 2

## 2024-06-28 MED ORDER — OXYTOCIN-SODIUM CHLORIDE 30-0.9 UT/500ML-% IV SOLN
INTRAVENOUS | Status: DC | PRN
  Administered 2024-06-28: 999 mL/h via INTRAVENOUS

## 2024-06-28 MED ORDER — TRANEXAMIC ACID-NACL 1000-0.7 MG/100ML-% IV SOLN
INTRAVENOUS | Status: AC
Start: 2024-06-28 — End: 2024-06-28
  Filled 2024-06-28: qty 100

## 2024-06-28 MED ORDER — KETOROLAC TROMETHAMINE 30 MG/ML IJ SOLN
30.0000 mg | Freq: Four times a day (QID) | INTRAMUSCULAR | Status: DC
  Filled 2024-06-28: qty 1

## 2024-06-28 MED ORDER — METOCLOPRAMIDE HCL 5 MG/ML IJ SOLN
INTRAMUSCULAR | Status: DC | PRN
  Administered 2024-06-28: 10 mg via INTRAVENOUS

## 2024-06-28 MED ORDER — BUPIVACAINE IN DEXTROSE 0.75-8.25 % IT SOLN
INTRATHECAL | Status: DC | PRN
  Administered 2024-06-28: 1.6 mL via INTRATHECAL

## 2024-06-28 MED ORDER — WITCH HAZEL-GLYCERIN EX PADS
1.0000 | MEDICATED_PAD | CUTANEOUS | Status: DC | PRN
Start: 2024-06-28 — End: 2024-07-01

## 2024-06-28 MED ORDER — EPHEDRINE 5 MG/ML INJ
INTRAVENOUS | Status: AC
  Filled 2024-06-28: qty 5

## 2024-06-28 MED ORDER — NALOXONE HCL 4 MG/10ML IJ SOLN: 1.0000 ug/kg/h | INTRAVENOUS | Status: DC | PRN

## 2024-06-28 MED ORDER — PRENATAL MULTIVITAMIN CH
1.0000 | ORAL_TABLET | Freq: Every day | ORAL | Status: DC
  Administered 2024-06-29 – 2024-07-01 (×3): 1 via ORAL
  Filled 2024-06-28 (×3): qty 1

## 2024-06-28 MED ORDER — SIMETHICONE 80 MG PO CHEW
80.0000 mg | CHEWABLE_TABLET | Freq: Three times a day (TID) | ORAL | Status: DC
  Administered 2024-06-28 – 2024-07-01 (×7): 80 mg via ORAL
  Filled 2024-06-28 (×9): qty 1

## 2024-06-28 MED ORDER — OXYTOCIN-SODIUM CHLORIDE 30-0.9 UT/500ML-% IV SOLN
INTRAVENOUS | Status: AC
  Filled 2024-06-28: qty 500

## 2024-06-28 MED ORDER — ACETAMINOPHEN 500 MG PO TABS
1000.0000 mg | ORAL_TABLET | Freq: Four times a day (QID) | ORAL | Status: AC
  Administered 2024-06-28 – 2024-06-29 (×4): 1000 mg via ORAL
  Filled 2024-06-28 (×4): qty 2

## 2024-06-28 MED ORDER — DIBUCAINE (PERIANAL) 1 % EX OINT
1.0000 | TOPICAL_OINTMENT | CUTANEOUS | Status: DC | PRN
Start: 2024-06-28 — End: 2024-07-01

## 2024-06-28 MED ORDER — PHENYLEPHRINE HCL-NACL 20-0.9 MG/250ML-% IV SOLN
INTRAVENOUS | Status: DC | PRN
  Administered 2024-06-28: 60 ug/min via INTRAVENOUS

## 2024-06-28 MED ORDER — FENTANYL CITRATE (PF) 100 MCG/2ML IJ SOLN
INTRAMUSCULAR | Status: DC | PRN
  Administered 2024-06-28: 15 ug via INTRATHECAL

## 2024-06-28 MED ORDER — CEFAZOLIN SODIUM-DEXTROSE 2-4 GM/100ML-% IV SOLN
2.0000 g | INTRAVENOUS | Status: AC
  Administered 2024-06-28: 2 g via INTRAVENOUS

## 2024-06-28 MED ORDER — MORPHINE SULFATE (PF) 0.5 MG/ML IJ SOLN
INTRAMUSCULAR | Status: AC
  Filled 2024-06-28: qty 10

## 2024-06-28 MED ORDER — ZOLPIDEM TARTRATE 5 MG PO TABS: 5.0000 mg | ORAL_TABLET | Freq: Every evening | ORAL | Status: DC | PRN

## 2024-06-28 SURGICAL SUPPLY — 35 items
BENZOIN TINCTURE PRP APPL 2/3 (GAUZE/BANDAGES/DRESSINGS) IMPLANT
CHLORAPREP W/TINT 26 (MISCELLANEOUS) ×2 IMPLANT
CLAMP UMBILICAL CORD (MISCELLANEOUS) ×1 IMPLANT
CLOTH BEACON ORANGE TIMEOUT ST (SAFETY) ×1 IMPLANT
DERMABOND ADVANCED .7 DNX12 (GAUZE/BANDAGES/DRESSINGS) IMPLANT
DISSECTOR SURG LIGASURE 21 (MISCELLANEOUS) IMPLANT
DRSG OPSITE POSTOP 4X10 (GAUZE/BANDAGES/DRESSINGS) ×1 IMPLANT
ELECTRODE REM PT RTRN 9FT ADLT (ELECTROSURGICAL) ×1 IMPLANT
EXTRACTOR VACUUM KIWI (MISCELLANEOUS) IMPLANT
EXTRACTOR VACUUM M CUP 4 TUBE (SUCTIONS) IMPLANT
GAUZE PAD ABD 7.5X8 STRL (GAUZE/BANDAGES/DRESSINGS) IMPLANT
GAUZE SPONGE 4X4 12PLY STRL LF (GAUZE/BANDAGES/DRESSINGS) IMPLANT
GLOVE BIO SURGEON STRL SZ7 (GLOVE) ×1 IMPLANT
GLOVE BIOGEL PI IND STRL 7.0 (GLOVE) ×1 IMPLANT
GOWN STRL REUS W/TWL LRG LVL3 (GOWN DISPOSABLE) ×2 IMPLANT
KIT ABG SYR 3ML LUER SLIP (SYRINGE) IMPLANT
MAT PREVALON FULL STRYKER (MISCELLANEOUS) IMPLANT
NDL HYPO 25X5/8 SAFETYGLIDE (NEEDLE) IMPLANT
NEEDLE HYPO 25X5/8 SAFETYGLIDE (NEEDLE) IMPLANT
NS IRRIG 1000ML POUR BTL (IV SOLUTION) ×1 IMPLANT
PACK C SECTION WH (CUSTOM PROCEDURE TRAY) ×1 IMPLANT
PAD OB MATERNITY 4.3X12.25 (PERSONAL CARE ITEMS) ×1 IMPLANT
RTRCTR C-SECT PINK 25CM LRG (MISCELLANEOUS) IMPLANT
STRIP CLOSURE SKIN 1/2X4 (GAUZE/BANDAGES/DRESSINGS) IMPLANT
SUT MNCRL 0 VIOLET CTX 36 (SUTURE) ×2 IMPLANT
SUT PLAIN 0 NONE (SUTURE) IMPLANT
SUT PLAIN ABS 2-0 CT1 27XMFL (SUTURE) IMPLANT
SUT VIC AB 0 CT1 27XBRD ANBCTR (SUTURE) ×2 IMPLANT
SUT VIC AB 2-0 CT1 TAPERPNT 27 (SUTURE) ×1 IMPLANT
SUT VIC AB 2-0 SH 27XBRD (SUTURE) ×2 IMPLANT
SUT VIC AB 4-0 KS 27 (SUTURE) ×1 IMPLANT
SUT VICRYL 0 TIES 12 18 (SUTURE) IMPLANT
TOWEL OR 17X24 6PK STRL BLUE (TOWEL DISPOSABLE) ×1 IMPLANT
TRAY FOLEY W/BAG SLVR 14FR LF (SET/KITS/TRAYS/PACK) IMPLANT
WATER STERILE IRR 1000ML POUR (IV SOLUTION) ×1 IMPLANT

## 2024-06-28 NOTE — Op Note (Signed)
 Cesarean Section Procedure Note   Jacqueline [name]  06/28/2024 Procedure: Repeat Low Transverse Cesarean section and right partial salpingectomy sterilization.   Indications: Placenta previa.  H/o C/section x 2. Permanent sterilization request. H/o left salpingectomy.    Pre-operative Diagnosis: Placenta previa, history of 2 previous cesarean sections; desires permanent sterilization.  Transverse lie.  Post-operative Diagnosis: same   Surgeon: Barbette Knock, MD - Primary                  Armond Cape, MD - Assisting    Anesthesia: spinal   Procedure Details:  The patient was seen in the Holding Room. The risks, benefits, complications, treatment options, and expected outcomes were discussed with the patient. The patient concurred with the proposed plan, giving informed consent. identified as Jacqueline [name] and the procedure verified as C-Section Delivery and tubal sterilization. A Time Out was held and the above information confirmed. 2 gm Ancef  given. TXA 1000mg  given due to being at high risk for hemorrhage.  After induction of spinal anesthesia, the patient was draped and prepped in the usual sterile manner, foley was draining urine well.  A pfannenstiel incision was made and carried down through the subcutaneous tissue to the fascia. Fascial incision was made and extended transversely. The fascia was separated from the underlying rectus tissue superiorly and inferiorly. The peritoneum was identified and entered. Peritoneal incision was extended longitudinally. Alexis-O retractor placed. There was not a clear bladder peritoneal separation. Lower segment was vascular with large lateral vessels, was bulging and thin. A low transverse uterine incision was made. Amniotic fluid was clear. Delivered from transverse presentation was a female  infant with good cry. Apgar scores Not recorded when this note was entered. Delayed cord clamping done at 1 minute and baby handed to NICU team in attendance.  Cord ph was not sent. Cord blood was obtained for evaluation. Placenta was removed after pitocin  noted uterine contraction. The placenta was removed Intact and appeared normal. The uterine outline was normal without extension but with brisk bleeding. All sinuses clamped with 4 Ring forceps.  She was given Methergine  0.2 mg IM The uterine incision was closed with running locked sutures of in Single layer. Hemostasis was observed.  Right tube was absent. Left tube was present but I could not carry out to fimbria due to distal adhesions and ovary was also adherent to posterior uterus. Mid segment grasped with Babcock. There was not much laxity. So partial salpingectomy performed using Ligasure Exact. Hemostasis noted. Peritoneal gutters evacuated of blood and debris.   Alexis retractor removed. Peritoneal closure done with 2-0 Vicryl.  The fascia was then reapproximated with running sutures of 0Vicryl. The skin was closed with 4-0Vicryl.   Steristrips, Honeycomb and pressure dressing placed.   Instrument, sponge, and needle counts were correct prior the abdominal closure and were correct at the conclusion of the case.   Findings: Baby girl. Transverse lie with back up. Delivered by complete breech extraction steps, keeping baby covered with moist towel and back anterior and head flexed.  Baby showed some respiratory distress and was taken to NICU.  Right tube absent (prior surgery), left tube partial salpingectomy only possible due to distal adhesions.    Estimated Blood Loss: 1107 cc  Total IV Fluids: LR 2000 ml   Urine Output: 200CC OF clear urine  Specimens: placenta, cord blood   Complications: hemorrhage   Disposition: PACU - hemodynamically stable.   Maternal Condition: stable   Baby condition / location:  NICU  for RDS/ TTN   An experienced assistant was required given the standard of surgical care given the complexity of the case.  This assistant was needed for exposure,  dissection, suctioning, retraction, instrument exchange, assisting with delivery with administration of fundal pressure, and for overall help during the procedure.    Attending Attestation: I performed the procedure.   Signed: Robbi Render MD

## 2024-06-28 NOTE — Plan of Care (Signed)

## 2024-06-28 NOTE — Lactation Note (Signed)
 This note was copied from a baby's chart.  NICU Lactation Consultation Note  Patient Name: Jacqueline [name] Date: 06/28/2024 Age:34 hours  Reason for consult: Initial assessment; NICU baby; Late-preterm 34-36.6wks; Other (Comment); Exclusive pumping and bottle feeding (IVF)  SUBJECTIVE Visited with family of 20 30/91 weeks old NICU female; baby Jacqueline [name] got admitted due to respiratory distress. Jacqueline [name] is a P3 and reported she started pumping soon after she got in her room and already getting enough colostrum to collect, praised her for all her efforts. Her plan is to exclusively pump and bottle feed like she did her her other babies; but she's opened to do STS care and maybe even latch if baby wishes to. Reviewed pumping schedule, pumping log, lactogenesis II/III, CDC and anticipatory guidelines.   OBJECTIVE Infant data: Mother's Current Feeding Choice: Breast Milk and Donor Milk  O2 Device: CPAP FiO2 (%): 21 %  Maternal data: [id number] C-Section, Low Transverse Has patient been taught Hand Expression?: No Hand Expression Comments: patient feels more comfortable with just pumping Significant Breast History:: (+) breast changes during the pregnancy Current breast feeding challenges:: NICU admission Previous breastfeeding challenges?: Exclusive pump and bottle fed (1st baby was in NICU) Does the patient have breastfeeding experience prior to this delivery?: Yes How long did the patient breastfeed?: 9 months (but babies got breastmilk until 1 year due to having stock) Pumping frequency: initiated pumping at 4 hours post-partum Pumped volume: 7 mL Flange Size: 21 Hands-free pumping top sizes: -- (has her own pumping bra) Risk factor for low/delayed milk supply:: prematurity, infant separation, IVF  Pump: Personal, Hands Free (Spectra  S1 (3), MomCozy wireless (2))  ASSESSMENT Infant: Feeding Status: Scheduled 9-12-3-6 Feeding method: Tube/Gavage (Bolus)  Maternal: Milk  volume: Normal  INTERVENTIONS/PLAN Interventions: Interventions: Breast feeding basics reviewed; Breast massage; Hand express; Coconut oil; DEBP; Education; NICU Pumping Log; LC Services brochure; CDC Guidelines for Breast Pump Cleaning; CDC milk storage guidelines Tools: Pump; Flanges; Coconut oil; Hands-free pumping top Pump Education: Setup, frequency, and cleaning; Milk Storage  Plan: STS around care times Pump both breasts on initiate mode every 3 hours for 15 minutes, ideally 8 pumping sessions/24 hours  MGM and female visitor present. All questions and concerns answered, family to contact [facility] services PRN.  Consult Status: NICU follow-up NICU Follow-up type: New admission follow up   Jacqueline [name] 06/28/2024, 4:32 PM

## 2024-06-28 NOTE — Transfer of Care (Signed)
 Immediate Anesthesia Transfer of Care Note  Patient: Jacqueline [name]  Procedure(s) Performed: CESAREAN DELIVERY  Patient Location: PACU  Anesthesia Type:Spinal  Level of Consciousness: awake, alert , and oriented  Airway & Oxygen Therapy: Patient Spontanous Breathing  Post-op Assessment: Report given to RN and Post -op Vital signs reviewed and stable  Post vital signs: Reviewed and stable  Last Vitals:  Vitals Value Taken Time  BP 104/60 06/28/24 09:00  Temp 36.4 C 06/28/24 08:54  Pulse 72 06/28/24 09:05  Resp 15 06/28/24 09:05  SpO2 98 % 06/28/24 09:05  Vitals shown include unfiled device data.  Last Pain:  Vitals:   06/28/24 0854  TempSrc: Oral  PainSc: 0-No pain         Complications: No notable events documented.

## 2024-06-28 NOTE — Anesthesia Postprocedure Evaluation (Signed)
 Anesthesia Post Note  Patient: Jacqueline [name]  Procedure(s) Performed: CESAREAN DELIVERY     Anesthesia Type: Spinal Level of consciousness: oriented and awake and alert Pain management: pain level controlled Vital Signs Assessment: post-procedure vital signs reviewed and stable Respiratory status: spontaneous breathing, respiratory function stable and patient connected to nasal cannula oxygen Cardiovascular status: blood pressure returned to baseline and stable Postop Assessment: no headache, no backache, no apparent nausea or vomiting and spinal receding Anesthetic complications: no   No notable events documented.  Last Vitals:  Vitals:   06/28/24 0915 06/28/24 0930  BP: 109/62 (!) 108/52  Pulse: 68 70  Resp: 15 13  Temp: 36.4 C   SpO2: 98% 100%    Last Pain:  Vitals:   06/28/24 0930  TempSrc:   PainSc: 0-No pain   Pain Goal:    LLE Motor Response: Purposeful movement (06/28/24 0930)   RLE Motor Response: Purposeful movement (06/28/24 0930)       Epidural/Spinal Function Cutaneous sensation: Tingles (06/28/24 0930), Patient able to flex knees: No (06/28/24 0930), Patient able to lift hips off bed: No (06/28/24 0930), Back pain beyond tenderness at insertion site: No (06/28/24 0930), Progressively worsening motor and/or sensory loss: No (06/28/24 0930), Bowel and/or bladder incontinence post epidural: No (06/28/24 0930)  Rome Ade

## 2024-06-28 NOTE — Anesthesia Procedure Notes (Signed)
 Spinal  Patient location during procedure: OR Start time: 06/28/2024 7:35 AM End time: 06/28/2024 7:40 AM Reason for block: surgical anesthesia Staffing Performed: anesthesiologist  Anesthesiologist: Boone Fess, MD Performed by: Boone Fess, MD Authorized by: Boone Fess, MD   Preanesthetic Checklist Completed: patient identified, IV checked, site marked, risks and benefits discussed, surgical consent, monitors and equipment checked, pre-op evaluation and timeout performed Spinal Block Patient position: sitting Prep: ChloraPrep and site prepped and draped Patient monitoring: heart rate, continuous pulse ox, blood pressure and cardiac monitor Approach: midline Location: L3-4 Injection technique: single-shot Needle Needle type: Whitacre and Introducer  Needle gauge: 24 G Needle length: 9 cm Assessment Sensory level: T10 Events: CSF return Additional Notes Meticulous sterile technique used throughout (CHG prep, sterile gloves, sterile drape). Negative paresthesia. Negative blood return. Positive free-flowing CSF. Expiration date of kit checked and confirmed. Patient tolerated procedure well, without complications.

## 2024-06-28 NOTE — Anesthesia Preprocedure Evaluation (Addendum)
 Anesthesia Evaluation  Patient identified by MRN, date of birth, ID band Patient awake  General Assessment Comment:  Tertiary c/s. Complete previa. Accreta in prior pregnancies, but none on this per prior MFM workup. Uneventful spinal anesthetics for previous two c/s.  Reviewed: Allergy & Precautions, NPO status , Patient's Chart, lab work & pertinent test results  History of Anesthesia Complications Negative for: history of anesthetic complications  Airway Mallampati: I  TM Distance: >3 FB Neck ROM: Full    Dental no notable dental hx. (+) Teeth Intact   Pulmonary neg pulmonary ROS, neg sleep apnea, neg COPD, Patient abstained from smoking.Not current smoker   Pulmonary exam normal breath sounds clear to auscultation       Cardiovascular Exercise Tolerance: Good METS(-) hypertension(-) CAD and (-) Past MI negative cardio ROS (-) dysrhythmias  Rhythm:Regular Rate:Normal - Systolic murmurs    Neuro/Psych  Headaches  negative psych ROS   GI/Hepatic ,neg GERD  ,,(+)     (-) substance abuse    Endo/Other  neg diabetes    Renal/GU negative Renal ROS     Musculoskeletal   Abdominal   Peds  Hematology Denies blood thinner use or bleeding disorders.    Anesthesia Other Findings Denies blood thinner use or bleeding diatheses. Recent labs reviewed. Past Medical History: No date: Allergic rhinitis No date: Allergy No date: Endometriosis 09/22/2021: Family history of breast cancer No date: History of asthma     Comment:  as child No date: History of placenta previa     Comment:  2019 and 2022 03/28/2017: Leukocytosis 05/29/2016: Multinodular goiter No date: Ocular migraine 04/06/2020: Personal history of COVID-19     Comment:  Last Assessment & Plan:   Formatting of this note might               be different from the original.  Pleasant 33 year old               female who is afebrile, nontoxic and in no acute                distress.  She is well past her recommended quarantine               time per CDC guidelines.  She is asymptomatic for               COVID-19.  She has had no additional exposures.  She is               considered fully recovered and is cleared for travel.   03/28/2017: Prolonged Q-T interval on ECG 03/28/2017: Thrombocytosis 2018: Thyroid  nodule     Comment:  small   Reproductive/Obstetrics (+) Pregnancy                              Anesthesia Physical Anesthesia Plan  ASA: 2  Anesthesia Plan: Spinal   Post-op Pain Management:    Induction:   PONV Risk Score and Plan: 4 or greater and Ondansetron  and Dexamethasone   Airway Management Planned: Natural Airway  Additional Equipment:   Intra-op Plan:   Post-operative Plan:   Informed Consent: I have reviewed the patients History and Physical, chart, labs and discussed the procedure including the risks, benefits and alternatives for the proposed anesthesia with the patient or authorized representative who has indicated his/her understanding and acceptance.       Plan Discussed with: CRNA and Surgeon  Anesthesia Plan Comments: (  Discussed R/B/A of neuraxial anesthesia technique with patient: - rare risks of spinal/epidural hematoma, nerve damage, infection - Risk of PDPH - Risk of itching - Risk of nausea and vomiting - Risk of conversion to general anesthesia and its associated risks, including sore throat, damage to lips/teeth/oropharynx, and rare risks such as cardiac and respiratory events. - Risk of surgical bleeding requiring blood products - Risk of allergic reactions Discussed the role of CRNA in patient's perioperative care.  I discussed patient's increased risk of GETA and transfusions due to complete previa and tertiary c/s.  Patient did OK with intrathecal morphine  last c/s, thinks her morphine  reaction may have been to IV morphine . Will plan on intrathecal morphine  again  today.  Patient voiced understanding.)         Anesthesia Quick Evaluation

## 2024-06-29 ENCOUNTER — Other Ambulatory Visit: Payer: Self-pay

## 2024-06-29 ENCOUNTER — Encounter (HOSPITAL_COMMUNITY): Payer: Self-pay | Admitting: Obstetrics & Gynecology

## 2024-06-29 LAB — CBC
HCT: 28.3 % — ABNORMAL LOW (ref 36.0–46.0)
Hemoglobin: 9.6 g/dL — ABNORMAL LOW (ref 12.0–15.0)
MCH: 33 pg (ref 26.0–34.0)
MCHC: 33.9 g/dL (ref 30.0–36.0)
MCV: 97.3 fL (ref 80.0–100.0)
Platelets: 225 K/uL (ref 150–400)
RBC: 2.91 MIL/uL — ABNORMAL LOW (ref 3.87–5.11)
RDW: 14.2 % (ref 11.5–15.5)
WBC: 13.7 K/uL — ABNORMAL HIGH (ref 4.0–10.5)
nRBC: 0 % (ref 0.0–0.2)

## 2024-06-29 NOTE — Lactation Note (Signed)
 This note was copied from a baby's chart.  NICU Lactation Consultation Note  Patient Name: Jacqueline [name] Date: 06/29/2024 Age:34 hours  Reason for consult: Follow-up assessment; NICU baby; Late-preterm 34-36.6wks; Other (Comment) (IVF pregnancy)  SUBJECTIVE  LC in to visit with P3 Mom of LPTI admitted to NICU for respiratory support.  Baby Jacqueline [name] is being fed using IDF scheduled feedings and Mom breast fed baby for 22 and 25 mins last two feedings.  Mom last pumped and expressed 10 ml colostrum.  Mom hadn't pumped after the last breastfeeding, but LC encouraged her to continue her pumping to support her milk supply.  LC made an appt to assist/assess baby at the breast at 12 noon tomorrow 8/25.  OBJECTIVE Infant data: Mother's Current Feeding Choice: Breast Milk and Donor Milk  O2 Device: Room Air FiO2 (%): 21 %  Infant feeding assessment IDFTS - Readiness: 2 IDFTS - Quality: 3   Maternal data: [id number] C-Section, Low Transverse Has patient been taught Hand Expression?: No Hand Expression Comments: patient feels more comfortable with just pumping Significant Breast History:: (+) breast changes during the pregnancy Current breast feeding challenges:: NICU admission Previous breastfeeding challenges?: Exclusive pump and bottle fed (1st baby was in NICU) Does the patient have breastfeeding experience prior to this delivery?: Yes How long did the patient breastfeed?: 9 months (but babies got breastmilk until 1 year due to having stock) Pumping frequency: Last pumped 4 hrs ago, encouraged to pump after baby breastfeeds Pumped volume: 10 mL Flange Size: 21 Hands-free pumping top sizes: -- (has her own pumping bra) Risk factor for low/delayed milk supply:: prematurity, infant separation, IVF  WIC Program: No WIC Referral Sent?: No Pump: Personal, Hands Free (Spectra  S1 (3), MomCozy wireless (2))  ASSESSMENT Infant: Latch: Grasps breast easily, tongue down, lips  flanged, rhythmical sucking. Audible Swallowing: Spontaneous and intermittent Type of Nipple: Everted at rest and after stimulation Comfort (Breast/Nipple): Soft / non-tender Hold (Positioning): No assistance needed to correctly position infant at breast. LATCH Score: 10  Feeding Status: Scheduled 9-12-3-6 Feeding method: Breast Nipple Type: Dr. Jonna Fling Preemie  Maternal: Milk volume: Normal  INTERVENTIONS/PLAN Interventions: Interventions: Breast feeding basics reviewed; DEBP; Education Tools: Pump; Flanges Pump Education: Setup, frequency, and cleaning; Milk Storage  Plan: Consult Status: NICU follow-up NICU Follow-up type: Verify absence of engorgement; Verify onset of copious milk; Maternal D/C visit   Claudene Aleck BRAVO 06/29/2024, 5:31 PM

## 2024-06-29 NOTE — Progress Notes (Signed)
 Subjective: Postpartum Day 1: Cesarean Delivery Patient reports tolerating PO, + flatus, + BM and no problems voiding.    Objective: Vital signs in last 24 hours: Temp:  [97.6 F (36.4 C)-98.8 F (37.1 C)] 98.4 F (36.9 C) (08/24 0456) Pulse Rate:  [63-77] 66 (08/24 0456) Resp:  [11-18] 18 (08/24 0456) BP: (96-113)/(52-79) 101/62 (08/24 0456) SpO2:  [98 %-100 %] 99 % (08/24 0456)  Physical Exam:  General: alert Lochia: appropriate Uterine Fundus: firm Incision: healing well, no significant drainage, no dehiscence DVT Evaluation: No evidence of DVT seen on physical exam.  Recent Labs    06/27/24 1600 06/29/24 0508  HGB 11.9* 9.6*  HCT 36.1 28.3*    Assessment/Plan:  POD 1 repeat CS for previa Status post Cesarean section. Doing well postoperatively.  Continue current care.  Modesty Rudy A Sharlena Kristensen 06/29/2024, 6:42 AM

## 2024-06-30 ENCOUNTER — Encounter (HOSPITAL_COMMUNITY): Payer: Self-pay | Admitting: Obstetrics & Gynecology

## 2024-06-30 NOTE — Plan of Care (Signed)
  Problem: Education: Goal: Knowledge of General Education information will improve Description: Including pain rating scale, medication(s)/side effects and non-pharmacologic comfort measures Outcome: Progressing   Problem: Health Behavior/Discharge Planning: Goal: Ability to manage health-related needs will improve Outcome: Progressing   Problem: Clinical Measurements: Goal: Ability to maintain clinical measurements within normal limits will improve Outcome: Progressing Goal: Will remain free from infection Outcome: Progressing Goal: Diagnostic test results will improve Outcome: Progressing Goal: Respiratory complications will improve Outcome: Progressing Goal: Cardiovascular complication will be avoided Outcome: Progressing   Problem: Activity: Goal: Risk for activity intolerance will decrease Outcome: Progressing   Problem: Coping: Goal: Level of anxiety will decrease Outcome: Progressing   Problem: Elimination: Goal: Will not experience complications related to bowel motility Outcome: Progressing Goal: Will not experience complications related to urinary retention Outcome: Progressing   Problem: Pain Managment: Goal: General experience of comfort will improve and/or be controlled Outcome: Progressing   Problem: Safety: Goal: Ability to remain free from injury will improve Outcome: Progressing   Problem: Skin Integrity: Goal: Risk for impaired skin integrity will decrease Outcome: Progressing   Problem: Education: Goal: Knowledge of the prescribed therapeutic regimen will improve Outcome: Progressing Goal: Understanding of sexual limitations or changes related to disease process or condition will improve Outcome: Progressing Goal: Individualized Educational Video(s) Outcome: Progressing   Problem: Self-Concept: Goal: Communication of feelings regarding changes in body function or appearance will improve Outcome: Progressing   Problem: Skin  Integrity: Goal: Demonstration of wound healing without infection will improve Outcome: Progressing   Problem: Education: Goal: Knowledge of condition will improve Outcome: Progressing Goal: Individualized Educational Video(s) Outcome: Progressing Goal: Individualized Newborn Educational Video(s) Outcome: Progressing   Problem: Activity: Goal: Will verbalize the importance of balancing activity with adequate rest periods Outcome: Progressing Goal: Ability to tolerate increased activity will improve Outcome: Progressing   Problem: Coping: Goal: Ability to identify and utilize available resources and services will improve Outcome: Progressing   Problem: Life Cycle: Goal: Chance of risk for complications during the postpartum period will decrease Outcome: Progressing   Problem: Role Relationship: Goal: Ability to demonstrate positive interaction with newborn will improve Outcome: Progressing   Problem: Skin Integrity: Goal: Demonstration of wound healing without infection will improve Outcome: Progressing

## 2024-06-30 NOTE — Discharge Instructions (Signed)

## 2024-06-30 NOTE — Progress Notes (Signed)

## 2024-06-30 NOTE — Progress Notes (Incomplete)
 No c/o; pain controlled, tol po, ambulating Voids w/o difficulty; +flatus; normal lochia Baby in NICU  Patient Vitals for the past 24 hrs:  BP Temp Temp src Pulse Resp SpO2  06/29/24 1931 123/75 97.9 F (36.6 C) Oral 76 18 100 %   A&ox3 Rrr Ctab Abd: +bs, soft,nt, nd, fundus firm and below umb; dressing d/c/I LE: no edema,nt bilat     Latest Ref Rng & Units 06/29/2024    5:08 AM 06/27/2024    4:00 PM 05/02/2023   11:38 AM  CBC  WBC 4.0 - 10.5 K/uL 13.7  11.4  9.3   Hemoglobin 12.0 - 15.0 g/dL 9.6  88.0  86.5   Hematocrit 36.0 - 46.0 % 28.3  36.1  40.2   Platelets 150 - 400 K/uL 225  268  295    A/P: pod 2 s/p rltcs, previa Doing well, contin care; d/c home tomorrow ABL anemia - sig. But asymptomatic, plan iron  daily Rh pos Rub immune Girl, in NICU

## 2024-07-01 LAB — TYPE AND SCREEN
ABO/RH(D): A POS
Antibody Screen: POSITIVE
Unit division: 0
Unit division: 0

## 2024-07-01 LAB — BPAM RBC
Blood Product Expiration Date: 202509202359
Blood Product Expiration Date: 202509202359
Unit Type and Rh: 6200
Unit Type and Rh: 6200

## 2024-07-01 LAB — SURGICAL PATHOLOGY

## 2024-07-01 MED ORDER — WITCH HAZEL-GLYCERIN EX PADS
1.0000 | MEDICATED_PAD | CUTANEOUS | 12 refills | Status: AC | PRN
Start: 2024-07-01 — End: ?

## 2024-07-01 MED ORDER — IBUPROFEN 600 MG PO TABS: 600.0000 mg | ORAL_TABLET | Freq: Four times a day (QID) | ORAL | 0 refills | Status: AC

## 2024-07-01 MED ORDER — ACETAMINOPHEN 325 MG PO TABS: 650.0000 mg | ORAL_TABLET | ORAL | Status: AC | PRN

## 2024-07-01 MED ORDER — OXYCODONE HCL 5 MG PO TABS: 5.0000 mg | ORAL_TABLET | ORAL | 0 refills | Status: AC | PRN

## 2024-07-01 NOTE — Plan of Care (Signed)
  Problem: Education: Goal: Knowledge of General Education information will improve Description: Including pain rating scale, medication(s)/side effects and non-pharmacologic comfort measures Outcome: Progressing   Problem: Health Behavior/Discharge Planning: Goal: Ability to manage health-related needs will improve Outcome: Progressing   Problem: Clinical Measurements: Goal: Ability to maintain clinical measurements within normal limits will improve Outcome: Progressing Goal: Will remain free from infection Outcome: Progressing Goal: Diagnostic test results will improve Outcome: Progressing Goal: Respiratory complications will improve Outcome: Progressing Goal: Cardiovascular complication will be avoided Outcome: Progressing   Problem: Activity: Goal: Risk for activity intolerance will decrease Outcome: Progressing   Problem: Coping: Goal: Level of anxiety will decrease Outcome: Progressing   Problem: Elimination: Goal: Will not experience complications related to bowel motility Outcome: Progressing Goal: Will not experience complications related to urinary retention Outcome: Progressing   Problem: Pain Managment: Goal: General experience of comfort will improve and/or be controlled Outcome: Progressing   Problem: Safety: Goal: Ability to remain free from injury will improve Outcome: Progressing   Problem: Skin Integrity: Goal: Risk for impaired skin integrity will decrease Outcome: Progressing   Problem: Education: Goal: Knowledge of the prescribed therapeutic regimen will improve Outcome: Progressing Goal: Understanding of sexual limitations or changes related to disease process or condition will improve Outcome: Progressing Goal: Individualized Educational Video(s) Outcome: Progressing   Problem: Self-Concept: Goal: Communication of feelings regarding changes in body function or appearance will improve Outcome: Progressing   Problem: Skin  Integrity: Goal: Demonstration of wound healing without infection will improve Outcome: Progressing   Problem: Education: Goal: Knowledge of condition will improve Outcome: Progressing Goal: Individualized Educational Video(s) Outcome: Progressing Goal: Individualized Newborn Educational Video(s) Outcome: Progressing   Problem: Activity: Goal: Will verbalize the importance of balancing activity with adequate rest periods Outcome: Progressing Goal: Ability to tolerate increased activity will improve Outcome: Progressing   Problem: Coping: Goal: Ability to identify and utilize available resources and services will improve Outcome: Progressing   Problem: Life Cycle: Goal: Chance of risk for complications during the postpartum period will decrease Outcome: Progressing   Problem: Role Relationship: Goal: Ability to demonstrate positive interaction with newborn will improve Outcome: Progressing   Problem: Skin Integrity: Goal: Demonstration of wound healing without infection will improve Outcome: Progressing

## 2024-07-01 NOTE — Progress Notes (Signed)
 Post Partum Day 3  Subjective: no complaints, up ad lib, voiding, tolerating PO, and + flatus, moderate lochia. Newborn is in the NICU  Objective: Blood pressure 112/76, pulse 66, temperature 97.9 F (36.6 C), temperature source Oral, resp. rate 17, height 5' 2 (1.575 m), weight 74.4 kg, last menstrual period 11/04/2023, SpO2 99%, unknown if currently breastfeeding.  Physical Exam:  General: alert, cooperative, and no distress Lochia: appropriate Uterine Fundus: firm Abdomen: normal bowel sounds Incision: Honeycomb dressing present and intact, no evidence of active drainage DVT Evaluation: No evidence of DVT seen on physical exam. Negative Homan's sign. Calf/Ankle edema is present.  Recent Labs    06/29/24 0508  HGB 9.6*  HCT 28.3*    Assessment/Plan: Discharge home and Breastfeeding   LOS: 3 days   Jacqueline CHRISTELLA Fischer, DO 07/01/2024, 12:16 PM

## 2024-07-01 NOTE — Lactation Note (Signed)
 This note was copied from a baby's chart.  NICU Lactation Consultation Note  Patient Name: Jacqueline [name] Date: 07/01/2024 Age:35 hours  Reason for consult: Follow-up assessment; NICU baby; Late-preterm 34-36.6wks; Maternal discharge; Other (Comment); Exclusive pumping and bottle feeding (IVF)  SUBJECTIVE Visited with family of 10 1/61 weeks old AGA NICU female Jacqueline [name]; Jacqueline [name] is a P3 and reported she's been pumping consistently every 3 hours day and night, her milk came in, praised her for all her efforts. She's getting discharged from the [facility] today. Reviewed discharge education, pump settings and the importance of consistent pumping for the prevention of engorgement and to protect her supply. She voiced she tried breastfeeding baby yesterday but it didn't work out, so she's switching back to bottle feeding for now.   OBJECTIVE Infant data: Mother's Current Feeding Choice: Breast Milk  O2 Device: Room Air  Infant feeding assessment IDFTS - Readiness: 2 IDFTS - Quality: 2   Maternal data: [id number] C-Section, Low Transverse Pumping frequency: 8 times/24 hours Pumped volume: 60 mL  WIC Program: No WIC Referral Sent?: No Pump: Personal, Hands Free (Spectra  S1 (3), MomCozy wireless (2))  ASSESSMENT Infant: Feeding Status: Ad lib Feeding method: Bottle Nipple Type: Dr. Jonna Fling Preemie  Maternal: Milk volume: Normal  INTERVENTIONS/PLAN Interventions: Interventions: Breast feeding basics reviewed; Coconut oil; DEBP; Education Discharge Education: Engorgement and breast care Tools: Pump; 16F feeding tube / Syringe  Plan: STS around care times Pump both breasts on maintain mode every 3 hours for 15-30 minutes, ideally 8 pumping sessions/24 hours Bring all pump pieces to baby's room after her discharge   No other support person at this time; FOB in the NICU visiting Jacqueline [name] during rounds. All questions and concerns answered, family to contact [facility] services  PRN.  Consult Status: NICU follow-up NICU Follow-up type: Verify absence of engorgement; Weekly NICU follow up   Kensly Bowmer S Rache Klimaszewski 07/01/2024, 11:02 AM

## 2024-07-01 NOTE — Plan of Care (Signed)
 Problem: Education: Goal: Knowledge of General Education information will improve Description: Including pain rating scale, medication(s)/side effects and non-pharmacologic comfort measures 07/01/2024 0928 by Madison Rosina LABOR, LPN Outcome: Adequate for Discharge 07/01/2024 0705 by Madison Rosina LABOR, LPN Outcome: Progressing   Problem: Health Behavior/Discharge Planning: Goal: Ability to manage health-related needs will improve 07/01/2024 0928 by Madison Rosina LABOR, LPN Outcome: Adequate for Discharge 07/01/2024 0705 by Madison Rosina LABOR, LPN Outcome: Progressing   Problem: Clinical Measurements: Goal: Ability to maintain clinical measurements within normal limits will improve 07/01/2024 0928 by Madison Rosina LABOR, LPN Outcome: Adequate for Discharge 07/01/2024 0705 by Madison Rosina LABOR, LPN Outcome: Progressing Goal: Will remain free from infection 07/01/2024 0928 by Madison Rosina LABOR, LPN Outcome: Adequate for Discharge 07/01/2024 0705 by Madison Rosina LABOR, LPN Outcome: Progressing Goal: Diagnostic test results will improve 07/01/2024 0928 by Madison Rosina LABOR, LPN Outcome: Adequate for Discharge 07/01/2024 9294 by Madison Rosina LABOR, LPN Outcome: Progressing Goal: Respiratory complications will improve 07/01/2024 0928 by Madison Rosina LABOR, LPN Outcome: Adequate for Discharge 07/01/2024 9294 by Madison Rosina LABOR, LPN Outcome: Progressing Goal: Cardiovascular complication will be avoided 07/01/2024 9071 by Madison Rosina LABOR, LPN Outcome: Adequate for Discharge 07/01/2024 9294 by Madison Rosina LABOR, LPN Outcome: Progressing   Problem: Activity: Goal: Risk for activity intolerance will decrease 07/01/2024 0928 by Madison Rosina LABOR, LPN Outcome: Adequate for Discharge 07/01/2024 0705 by Madison Rosina LABOR, LPN Outcome: Progressing   Problem: Coping: Goal: Level of anxiety will decrease 07/01/2024 0928 by Madison Rosina LABOR, LPN Outcome: Adequate for Discharge 07/01/2024 0705 by Madison Rosina LABOR, LPN Outcome: Progressing   Problem:  Elimination: Goal: Will not experience complications related to bowel motility 07/01/2024 0928 by Madison Rosina LABOR, LPN Outcome: Adequate for Discharge 07/01/2024 0705 by Madison Rosina LABOR, LPN Outcome: Progressing Goal: Will not experience complications related to urinary retention 07/01/2024 0928 by Madison Rosina LABOR, LPN Outcome: Adequate for Discharge 07/01/2024 0705 by Madison Rosina LABOR, LPN Outcome: Progressing   Problem: Pain Managment: Goal: General experience of comfort will improve and/or be controlled 07/01/2024 0928 by Madison Rosina LABOR, LPN Outcome: Adequate for Discharge 07/01/2024 0705 by Madison Rosina LABOR, LPN Outcome: Progressing   Problem: Safety: Goal: Ability to remain free from injury will improve 07/01/2024 0928 by Madison Rosina LABOR, LPN Outcome: Adequate for Discharge 07/01/2024 0705 by Madison Rosina LABOR, LPN Outcome: Progressing   Problem: Skin Integrity: Goal: Risk for impaired skin integrity will decrease 07/01/2024 0928 by Madison Rosina LABOR, LPN Outcome: Adequate for Discharge 07/01/2024 0705 by Madison Rosina LABOR, LPN Outcome: Progressing   Problem: Education: Goal: Knowledge of the prescribed therapeutic regimen will improve 07/01/2024 0928 by Madison Rosina LABOR, LPN Outcome: Adequate for Discharge 07/01/2024 0705 by Madison Rosina LABOR, LPN Outcome: Progressing Goal: Understanding of sexual limitations or changes related to disease process or condition will improve 07/01/2024 0928 by Madison Rosina LABOR, LPN Outcome: Adequate for Discharge 07/01/2024 9294 by Madison Rosina LABOR, LPN Outcome: Progressing Goal: Individualized Educational Video(s) 07/01/2024 0928 by Madison Rosina LABOR, LPN Outcome: Adequate for Discharge 07/01/2024 0705 by Madison Rosina LABOR, LPN Outcome: Progressing   Problem: Self-Concept: Goal: Communication of feelings regarding changes in body function or appearance will improve 07/01/2024 0928 by Madison Rosina LABOR, LPN Outcome: Adequate for Discharge 07/01/2024 0705 by Madison Rosina LABOR,  LPN Outcome: Progressing   Problem: Skin Integrity: Goal: Demonstration of wound healing without infection will improve 07/01/2024 0928 by Madison Rosina LABOR, LPN Outcome: Adequate for Discharge 07/01/2024 0705 by Madison Rosina LABOR,  LPN Outcome: Progressing   Problem: Education: Goal: Knowledge of condition will improve 07/01/2024 0928 by Madison Rosina LABOR, LPN Outcome: Adequate for Discharge 07/01/2024 9294 by Madison Rosina LABOR, LPN Outcome: Progressing Goal: Individualized Educational Video(s) 07/01/2024 0928 by Madison Rosina LABOR, LPN Outcome: Adequate for Discharge 07/01/2024 0705 by Madison Rosina LABOR, LPN Outcome: Progressing Goal: Individualized Newborn Educational Video(s) 07/01/2024 0928 by Madison Rosina LABOR, LPN Outcome: Adequate for Discharge 07/01/2024 0705 by Madison Rosina LABOR, LPN Outcome: Progressing   Problem: Activity: Goal: Will verbalize the importance of balancing activity with adequate rest periods 07/01/2024 0928 by Madison Rosina LABOR, LPN Outcome: Adequate for Discharge 07/01/2024 9294 by Madison Rosina LABOR, LPN Outcome: Progressing Goal: Ability to tolerate increased activity will improve 07/01/2024 0928 by Madison Rosina LABOR, LPN Outcome: Adequate for Discharge 07/01/2024 0705 by Madison Rosina LABOR, LPN Outcome: Progressing   Problem: Coping: Goal: Ability to identify and utilize available resources and services will improve 07/01/2024 0928 by Madison Rosina LABOR, LPN Outcome: Adequate for Discharge 07/01/2024 0705 by Madison Rosina LABOR, LPN Outcome: Progressing   Problem: Life Cycle: Goal: Chance of risk for complications during the postpartum period will decrease 07/01/2024 0928 by Madison Rosina LABOR, LPN Outcome: Adequate for Discharge 07/01/2024 0705 by Madison Rosina LABOR, LPN Outcome: Progressing   Problem: Role Relationship: Goal: Ability to demonstrate positive interaction with newborn will improve 07/01/2024 0928 by Madison Rosina LABOR, LPN Outcome: Adequate for Discharge 07/01/2024 0705 by Madison Rosina LABOR,  LPN Outcome: Progressing   Problem: Skin Integrity: Goal: Demonstration of wound healing without infection will improve 07/01/2024 0928 by Madison Rosina LABOR, LPN Outcome: Adequate for Discharge 07/01/2024 9294 by Madison Rosina LABOR, LPN Outcome: Progressing

## 2024-07-01 NOTE — Discharge Summary (Signed)
 Postpartum Discharge Summary  Date of Service updated: 07/01/2024     Patient Name: Jacqueline [name] DOB: 1991-02-19 MRN: [id number]  Date of admission: 06/28/2024 Delivery date:06/28/2024 Delivering provider: MODY, VAISHALI Date of discharge: 07/01/2024  Admitting diagnosis: Placenta previa specified as without hemorrhage in third trimester [O44.03] Status post repeat low transverse cesarean section [Z98.891] Intrauterine pregnancy: [redacted]w[redacted]d     Secondary diagnosis:  Principal Problem:   Postpartum care following cesarean delivery 8/22 Active Problems:   History of gestational diabetes mellitus (GDM) in prior pregnancy, currently pregnant   Placenta previa specified as without hemorrhage in third trimester   Status post repeat low transverse cesarean section     Discharge diagnosis: Preterm Pregnancy Delivered                                              Post partum procedures:n/a Augmentation: N/A Complications: None  Hospital course: Sceduled C/S   33 y.o. yo [id number] at [redacted]w[redacted]d was admitted to the hospital 06/28/2024 for scheduled cesarean section with the following indication:Previa.Delivery details are as follows:  Membrane Rupture Time/Date: 8:05 AM,06/28/2024  Delivery Method:C-Section, Low Transverse Operative Delivery:N/A Details of operation can be found in separate operative note.  Patient had a an uncomplicated postpartum course.  She is ambulating, tolerating a regular diet, passing flatus, and urinating well. Patient is discharged home in stable condition on  07/01/24        Newborn Data: Birth date:06/28/2024 Birth time:8:06 AM Gender:Female Living status:Living Apgars:8 ,9  Weight:3020 g    Immunization History  Administered Date(s) Administered   Influenza,inj,Quad PF,6+ Mos 08/20/2018   Influenza-Unspecified 08/20/2018   Tdap 10/12/2017, 09/04/2018, 01/28/2021    Physical exam  Vitals:   06/28/24 2140 06/29/24 0456 06/29/24 1931 06/30/24 2233  BP:  101/62  123/75 112/76  Pulse: 67 66 76 66  Resp: 18 18 18 17   Temp: 98.3 F (36.8 C) 98.4 F (36.9 C) 97.9 F (36.6 C)   TempSrc: Oral Oral Oral Oral  SpO2: 100% 99% 100% 99%  Weight:      Height:       General: alert, cooperative, and no distress Lochia: appropriate Uterine Fundus: firm Incision: Honeycomb dressing intact DVT Evaluation: No evidence of DVT seen on physical exam. Negative Homan's sign. Calf/Ankle edema is present Labs: Lab Results  Component Value Date   WBC 13.7 (H) 06/29/2024   HGB 9.6 (L) 06/29/2024   HCT 28.3 (L) 06/29/2024   MCV 97.3 06/29/2024   PLT 225 06/29/2024      Latest Ref Rng & Units 06/19/2021    6:54 PM  CMP  Glucose 70 - 99 mg/dL 78   BUN 6 - 20 mg/dL 18   Creatinine 9.55 - 1.00 mg/dL 9.01   Sodium 864 - 854 mmol/L 138   Potassium 3.5 - 5.1 mmol/L 4.0   Chloride 98 - 111 mmol/L 101   CO2 22 - 32 mmol/L 27   Calcium  8.9 - 10.3 mg/dL 89.7   Total Protein 6.5 - 8.1 g/dL 6.9   Total Bilirubin 0.3 - 1.2 mg/dL 0.8   Alkaline Phos 38 - 126 U/L 52   AST 15 - 41 U/L 18   ALT 0 - 44 U/L 16    Edinburgh Score:    06/29/2024    8:29 AM  Edinburgh Postnatal Depression Scale Screening Tool  I have  been able to laugh and see the funny side of things. 0  I have looked forward with enjoyment to things. 0  I have blamed myself unnecessarily when things went wrong. 0  I have been anxious or worried for no good reason. 0  I have felt scared or panicky for no good reason. 0  Things have been getting on top of me. 0  I have been so unhappy that I have had difficulty sleeping. 0  I have felt sad or miserable. 0  I have been so unhappy that I have been crying. 0  The thought of harming myself has occurred to me. 0  Edinburgh Postnatal Depression Scale Total 0      After visit meds:  Allergies as of 07/01/2024       Reactions   Shellfish Allergy Anaphylaxis   Says good for iodine    Ciprofloxacin     Caused QT prolongation   Morphine     Makes my  head burn on the inside, does not want morphine  (IV only, did ok with intrathecal morphine )   Wound Dressing Adhesive Other (See Comments)   Had a cellulitis reaction.  Unsure if it was drape or dressing related        Medication List     STOP taking these medications    acetaminophen  650 MG CR tablet Commonly known as: TYLENOL  Replaced by: acetaminophen  325 MG tablet   calcium  carbonate 500 MG chewable tablet Commonly known as: TUMS - dosed in mg elemental calcium    docusate sodium  100 MG capsule Commonly known as: COLACE   MAGNESIUM  GLYCINATE PO       TAKE these medications    acetaminophen  325 MG tablet Commonly known as: TYLENOL  Take 2 tablets (650 mg total) by mouth every 4 (four) hours as needed for mild pain (pain score 1-3) or fever (temperature > 101.5.). Replaces: acetaminophen  650 MG CR tablet   ferrous sulfate  325 (65 FE) MG EC tablet Take 325 mg by mouth every evening. Nature Made 65 mg Iron  Tablets   ibuprofen  600 MG tablet Commonly known as: ADVIL  Take 1 tablet (600 mg total) by mouth every 6 (six) hours.   loratadine  10 MG tablet Commonly known as: CLARITIN  Take 10 mg by mouth every evening.   oxyCODONE  5 MG immediate release tablet Commonly known as: Oxy IR/ROXICODONE  Take 1 tablet (5 mg total) by mouth every 4 (four) hours as needed for moderate pain (pain score 4-6).   PRENATAL 1 PO Take 1 tablet by mouth every evening. Prenatal Multivitamin Folic Acid + DHA Softgels   witch hazel-glycerin  pad Commonly known as: TUCKS Apply 1 Application topically as needed for hemorrhoids.         Discharge home in stable condition Infant Feeding: Breast Infant Disposition:NICU Discharge instruction: per After Visit Summary and Postpartum booklet. Activity: Advance as tolerated. Pelvic rest for 6 weeks.  Diet: routine diet Anticipated Birth Control: Bilateral salpingectomy at time of cesarean section Postpartum Appointment:6 weeks Additional  Postpartum F/U: Incision check 1 week Future Appointments:No future appointments. Follow up Visit:      07/01/2024 Delila CHRISTELLA Fischer, DO

## 2024-07-01 NOTE — Progress Notes (Signed)
 Patient in NICU at the time I tried to round. Will try again later

## 2024-07-01 NOTE — Progress Notes (Signed)
 CSW followed up with MOB at infant's bedside. CSW introduced and explained reason for consult. MOB shared that she is interested for [facility] insurance for infant as a secondary insurance due to infant's NICU admission. MOB denied any additional needs/questions. CSW agreed to notify financial counselor.   CSW notified Artist.   Suzen Law, LCSW Clinical Social Worker [facility] Cell#: [phone]

## 2024-07-08 ENCOUNTER — Telehealth (HOSPITAL_COMMUNITY): Payer: Self-pay | Admitting: *Deleted

## 2024-07-08 NOTE — Telephone Encounter (Signed)
 07/08/2024  Name: Jacqueline [name] MRN: [id number] DOB: [birth year]  Reason for Call:  Transition of Care Hospital Discharge Call  Contact Status: Patient Contact Status: Message  Language assistant needed:          Follow-Up Questions:    Van Postnatal Depression Scale:  In the Past 7 Days:    PHQ2-9 Depression Scale:     Discharge Follow-up:    Post-discharge interventions: NA  Mliss Sieve, RN 07/08/2024 17:08
